# Patient Record
Sex: Female | Born: 1990 | Race: Black or African American | Hispanic: No | Marital: Single | Smoking: Current every day smoker
Health system: Southern US, Community
[De-identification: ages and names within clinical notes are randomized; demographics above are authoritative.]

## PROBLEM LIST (undated history)

## (undated) DIAGNOSIS — I10 Essential (primary) hypertension: Secondary | ICD-10-CM

## (undated) DIAGNOSIS — K8020 Calculus of gallbladder without cholecystitis without obstruction: Secondary | ICD-10-CM

## (undated) DIAGNOSIS — F419 Anxiety disorder, unspecified: Secondary | ICD-10-CM

## (undated) DIAGNOSIS — E669 Obesity, unspecified: Secondary | ICD-10-CM

## (undated) DIAGNOSIS — F32A Depression, unspecified: Secondary | ICD-10-CM

## (undated) HISTORY — PX: NO PAST SURGERIES: SHX2092

---

## 2012-01-07 ENCOUNTER — Emergency Department (HOSPITAL_COMMUNITY)
Admission: EM | Admit: 2012-01-07 | Discharge: 2012-01-07 | Disposition: A | Discharge status: Home / Self Care | Payer: Medicaid Other | Attending: Emergency Medicine | Admitting: Emergency Medicine

## 2012-01-07 ENCOUNTER — Encounter (HOSPITAL_COMMUNITY): Payer: Self-pay | Admitting: Emergency Medicine

## 2012-01-07 DIAGNOSIS — R220 Localized swelling, mass and lump, head: Secondary | ICD-10-CM | POA: Insufficient documentation

## 2012-01-07 DIAGNOSIS — K089 Disorder of teeth and supporting structures, unspecified: Secondary | ICD-10-CM | POA: Insufficient documentation

## 2012-01-07 DIAGNOSIS — K0889 Other specified disorders of teeth and supporting structures: Secondary | ICD-10-CM

## 2012-01-07 MED ORDER — HYDROCODONE-ACETAMINOPHEN 5-500 MG PO TABS: 1.0000 | ORAL_TABLET | Freq: Four times a day (QID) | ORAL | Status: AC | PRN

## 2012-01-07 MED ORDER — IBUPROFEN 800 MG PO TABS: 800.0000 mg | ORAL_TABLET | Freq: Three times a day (TID) | ORAL | Status: AC

## 2012-01-07 MED ORDER — OXYCODONE-ACETAMINOPHEN 5-325 MG PO TABS
2.0000 | ORAL_TABLET | Freq: Once | ORAL | Status: AC
  Administered 2012-01-07: 2 via ORAL
  Filled 2012-01-07: qty 2

## 2012-01-07 MED ORDER — PENICILLIN V POTASSIUM 500 MG PO TABS: 500.0000 mg | ORAL_TABLET | Freq: Three times a day (TID) | ORAL | Status: AC

## 2012-01-07 NOTE — ED Notes (Addendum)
Alert, NAD, calm, interactive, c/o 10/10 pain, family at [facility].

## 2012-01-07 NOTE — ED Provider Notes (Signed)
History     CSN: [id number]  Arrival date & time 01/07/12  2144   First MD Initiated Contact with Patient 01/07/12 2239      Chief Complaint  Patient presents with  . Dental Pain    (Consider location/radiation/quality/duration/timing/severity/associated sxs/prior treatment) HPI  Patient presents to emergency department complaining of a two-week history of gradual onset left lower dental pain that has become more constant in nature over the last 2-3 days. Patient states that she recently moved to [place] from outside city. Patient states she has scheduled an appointment with a dentist in [place] on Monday, 5 days from now. Patient states that she's been taking an occasional Tylenol and aspirin for pain with only mild relief of pain. Patient states some mild swelling of her left lower jaw. She states she has no known medical problems and takes no medicines on a regular basis. Patient states pain is aggravated by chewing and cold air. She denies fevers, chills, difficulty swallowing, or difficulty breathing.  History reviewed. No pertinent past medical history.  History reviewed. No pertinent past surgical history.  No family history on file.  History  Substance Use Topics  . Smoking status: Current Everyday Smoker    Types: Cigarettes  . Smokeless tobacco: Not on file  . Alcohol Use: No    OB History    Grav Para Term Preterm Abortions TAB SAB Ect Mult Living                  Review of Systems  Constitutional: Negative for fever and chills.  HENT: Positive for facial swelling and dental problem. Negative for ear pain, sore throat, drooling, mouth sores, trouble swallowing and neck pain.   Eyes: Negative for visual disturbance.  Respiratory: Negative for shortness of breath.   Cardiovascular: Negative for chest pain.  Skin: Negative for color change.    Allergies  Review of patient's allergies indicates no known allergies.  Home Medications   Current  Outpatient Rx  Name Route Sig Dispense Refill  . HYDROCODONE-ACETAMINOPHEN 5-500 MG PO TABS Oral Take 1-2 tablets by mouth every 6 (six) hours as needed for pain. 15 tablet 0  . IBUPROFEN 800 MG PO TABS Oral Take 1 tablet (800 mg total) by mouth 3 (three) times daily. 21 tablet 0  . PENICILLIN V POTASSIUM 500 MG PO TABS Oral Take 1 tablet (500 mg total) by mouth 3 (three) times daily. 30 tablet 0    BP 143/99  Pulse 74  Temp(Src) 98.9 F (37.2 C) (Oral)  Resp 18  SpO2 99%  LMP 11/26/2011  Physical Exam  Nursing note and vitals reviewed. Constitutional: She is oriented to person, place, and time. She appears well-developed and well-nourished.  HENT:  Head: Normocephalic and atraumatic.  Right Ear: External ear normal.  Left Ear: External ear normal.  Mouth/Throat: Oropharynx is clear and moist. No oropharyngeal exudate.        Moderate tenderness to palpation of left lower first and second molars without obvious dental decay. No gingival swelling or mass. Mild tenderness to palpation of surrounding gingiva. No facial swelling. Uvula midline and patent airway. Swallowing secretions well. Normal TMs bilaterally.   Eyes: Conjunctivae are normal.  Neck: Normal range of motion. Neck supple.  Cardiovascular: Normal rate and regular rhythm.   Pulmonary/Chest: Effort normal and breath sounds normal.  Musculoskeletal: Normal range of motion.  Lymphadenopathy:    She has no cervical adenopathy.  Neurological: She is alert and oriented to person, place, and time.  Skin: Skin is warm and dry.  Psychiatric: She has a normal mood and affect. Her behavior is normal.    ED Course  Procedures (including critical care time)  Labs Reviewed - No data to display No results found.   1. Pain, dental       MDM  Patient is afebrile and nontoxic-appearing. No facial swelling. No obvious sign of dental infection however tenderness to palpation of left lower molars. Patient has a scheduled  appointment with dentist in 5 days. She is agreeable to following up with dentist but returning to emergency department for emergent changing or worsening symptoms.        Jenness Corner, [place] 01/07/12 2325

## 2012-01-07 NOTE — Discharge Instructions (Signed)
Apply warm compresses to jaw throughout the day. Take antibiotic and completion. Take ibuprofen as directed for pain and yake hydrocodone-acetaminophen as directed, as needed for pain but do not drive or operate machinery with pain medication use. Followup with a dentist is very important for ongoing evaluation and management of recurrent dental pain. Have her return to emergency department for emergent changing or worsening symptoms.   Toothache Toothaches are usually caused by tooth decay (cavity). However, other causes of toothache include:  Gum disease.   Cracked tooth.   Cracked filling.   Injury.   Jaw problem (temporo mandibular joint or TMJ disorder).   Tooth abscess.   Root sensitivity.   Grinding.   Eruption problems.  Swelling and redness around a painful tooth often means you have a dental abscess. Pain medicine and antibiotics can help reduce symptoms, but you will need to see a dentist within the next few days to have your problem properly evaluated and treated. If tooth decay is the problem, you may need a filling or root canal to save your tooth. If the problem is more severe, your tooth may need to be pulled. SEEK IMMEDIATE MEDICAL CARE IF:  You cannot swallow.   You develop severe swelling, increased redness, or increased pain in your mouth or face.   You have a fever.   You cannot open your mouth adequately.  Document Released: 10/09/2004 Document Revised: 08/21/2011 Document Reviewed: 11/29/2009 [facility] Patient Information 2012 [place], [place].

## 2012-01-07 NOTE — ED Notes (Signed)
Patient with dental pain in upper right jaw.  Some swelling noted.  Patient with appt with dentist on Monday.  Just moved here.

## 2012-01-07 NOTE — ED Notes (Signed)
Steady gait, given rx x3, out to d/c desk, out with family.

## 2012-01-11 NOTE — ED Provider Notes (Signed)
Medical screening examination/treatment/procedure(s) were performed by non-physician practitioner and as supervising physician I was immediately available for consultation/collaboration.  Cypress Hinkson, MD 01/11/12 1719 

## 2012-04-25 ENCOUNTER — Emergency Department (HOSPITAL_COMMUNITY)
Admission: EM | Admit: 2012-04-25 | Discharge: 2012-04-25 | Discharge status: Against Medical Advice | Payer: Medicaid Other | Attending: Emergency Medicine | Admitting: Emergency Medicine

## 2012-04-25 ENCOUNTER — Encounter (HOSPITAL_COMMUNITY): Payer: Self-pay | Admitting: *Deleted

## 2012-04-25 DIAGNOSIS — N898 Other specified noninflammatory disorders of vagina: Secondary | ICD-10-CM | POA: Insufficient documentation

## 2012-04-25 LAB — URINALYSIS, ROUTINE W REFLEX MICROSCOPIC
Bilirubin Urine: NEGATIVE
Ketones, ur: NEGATIVE mg/dL
Nitrite: NEGATIVE
Urobilinogen, UA: 1 mg/dL (ref 0.0–1.0)

## 2012-04-25 NOTE — ED Notes (Signed)
Pt was recently and here with yeast infection and never go meds filled for it d/t money and now back with same vaginal symptoms.  No abdominal pain.

## 2012-06-05 ENCOUNTER — Encounter (HOSPITAL_COMMUNITY): Payer: Self-pay | Admitting: *Deleted

## 2012-06-05 ENCOUNTER — Emergency Department (HOSPITAL_COMMUNITY)
Admission: EM | Admit: 2012-06-05 | Discharge: 2012-06-05 | Disposition: A | Discharge status: Home / Self Care | Payer: Medicaid Other | Attending: Emergency Medicine | Admitting: Emergency Medicine

## 2012-06-05 DIAGNOSIS — K029 Dental caries, unspecified: Secondary | ICD-10-CM | POA: Insufficient documentation

## 2012-06-05 DIAGNOSIS — K0889 Other specified disorders of teeth and supporting structures: Secondary | ICD-10-CM

## 2012-06-05 DIAGNOSIS — F17200 Nicotine dependence, unspecified, uncomplicated: Secondary | ICD-10-CM | POA: Insufficient documentation

## 2012-06-05 MED ORDER — TRAMADOL HCL 50 MG PO TABS
50.0000 mg | ORAL_TABLET | Freq: Once | ORAL | Status: AC
  Administered 2012-06-05: 50 mg via ORAL
  Filled 2012-06-05: qty 1

## 2012-06-05 MED ORDER — TRAMADOL HCL 50 MG PO TABS: 50.0000 mg | ORAL_TABLET | Freq: Four times a day (QID) | ORAL | Status: DC | PRN

## 2012-06-05 NOTE — ED Provider Notes (Signed)
History     CSN: [id number]  Arrival date & time 06/05/12  2052   First MD Initiated Contact with Patient 06/05/12 2117      Chief Complaint  Patient presents with  . Dental Pain    (Consider location/radiation/quality/duration/timing/severity/associated sxs/prior treatment) HPI Comments: Patient with a history of large cavity in the upper second molar on the right, states, about 2 weeks, ago.  The posterior portion of the tooth.  For, cough.  She's had increased pain.  She does have a dental appointment in 4, days.  The pain has increased despite her use of over-the-counter Tylenol  Patient is a 21 y.o. female presenting with tooth pain. The history is provided by the patient.  Dental PainThe primary symptoms include dental injury. Primary symptoms do not include headaches or fever. The symptoms began more than 1 week ago. The symptoms are worsening.  Additional symptoms do not include: ear pain.    History reviewed. No pertinent past medical history.  History reviewed. No pertinent past surgical history.  No family history on file.  History  Substance Use Topics  . Smoking status: Current Every Day Smoker    Types: Cigarettes  . Smokeless tobacco: Not on file  . Alcohol Use: No    OB History    Grav Para Term Preterm Abortions TAB SAB Ect Mult Living                  Review of Systems  Constitutional: Negative for fever.  HENT: Positive for dental problem. Negative for ear pain and rhinorrhea.   Gastrointestinal: Negative for nausea.  Musculoskeletal: Negative for myalgias.  Neurological: Negative for dizziness and headaches.    Allergies  Review of patient's allergies indicates no known allergies.  Home Medications   Current Outpatient Rx  Name Route Sig Dispense Refill  . TRAMADOL HCL 50 MG PO TABS Oral Take 1 tablet (50 mg total) by mouth every 6 (six) hours as needed for pain. 30 tablet 0    BP 140/79  Temp 98.7 F (37.1 C) (Oral)  Resp 18  SpO2  99%  Physical Exam  Constitutional: She is oriented to person, place, and time. She appears well-developed and well-nourished.  HENT:  Head: Normocephalic.  Mouth/Throat:    Eyes: Pupils are equal, round, and reactive to light.  Neck: Normal range of motion.  Cardiovascular: Normal rate.   Pulmonary/Chest: Effort normal.  Musculoskeletal: Normal range of motion.  Neurological: She is alert and oriented to person, place, and time.  Skin: Skin is warm.    ED Course  Procedures (including critical care time)  Labs Reviewed - No data to display No results found.   1. Pain, dental       MDM  Dental cavity with dental fracture.  As, well.  We'll treat with Ultram.  Encourage patient to keep her appointment on Thursday as scheduled        Arman Filter, NP 06/05/12 2146  Arman Filter, NP 06/05/12 2146

## 2012-06-05 NOTE — ED Notes (Signed)
The patient is AOx4 and comfortable with her discharge instructions. 

## 2012-06-05 NOTE — ED Notes (Signed)
Toothache for one week 

## 2012-06-06 NOTE — ED Provider Notes (Signed)
Medical screening examination/treatment/procedure(s) were performed by non-physician practitioner and as supervising physician I was immediately available for consultation/collaboration.  Donnetta Hutching, MD 06/06/12 1752

## 2013-02-09 ENCOUNTER — Emergency Department (HOSPITAL_COMMUNITY)
Admission: EM | Admit: 2013-02-09 | Discharge: 2013-02-09 | Disposition: A | Discharge status: Home / Self Care | Payer: Medicaid Other | Attending: Emergency Medicine | Admitting: Emergency Medicine

## 2013-02-09 ENCOUNTER — Encounter (HOSPITAL_COMMUNITY): Payer: Self-pay | Admitting: Adult Health

## 2013-02-09 DIAGNOSIS — F17200 Nicotine dependence, unspecified, uncomplicated: Secondary | ICD-10-CM | POA: Insufficient documentation

## 2013-02-09 DIAGNOSIS — L600 Ingrowing nail: Secondary | ICD-10-CM | POA: Insufficient documentation

## 2013-02-09 MED ORDER — CEPHALEXIN 500 MG PO CAPS: 500.0000 mg | ORAL_CAPSULE | Freq: Four times a day (QID) | ORAL | Status: DC

## 2013-02-09 MED ORDER — HYDROCODONE-ACETAMINOPHEN 10-325 MG PO TABS: 1.0000 | ORAL_TABLET | Freq: Four times a day (QID) | ORAL | Status: DC | PRN

## 2013-02-09 MED ORDER — HYDROCODONE-ACETAMINOPHEN 5-325 MG PO TABS
1.0000 | ORAL_TABLET | Freq: Once | ORAL | Status: AC
  Administered 2013-02-09: 1 via ORAL
  Filled 2013-02-09: qty 1

## 2013-02-09 NOTE — ED Provider Notes (Signed)
History     CSN: [id number]  Arrival date & time 02/09/13  0020   First MD Initiated Contact with Patient 02/09/13 [phone]      Chief Complaint  Patient presents with  . Toe Pain   HPI  History provided by the patient. The patient is a 22 year old female who presents with complaints of worsening pain to her left great toe and concerns for ingrown toenail and infection. Symptoms have been worsening over the past several days. Today however patient also reports having some purulent drainage and worsening pain and swelling around the toe. She denies having similar symptoms previously. She did attempt to use warm soaks but this did not help. She's not using treatments for pain or symptoms. Denies any other aggravating or alleviating factors. Denies any associated fever, chills or sweats. No weakness or numbness in the toe or foot. No other associated symptoms.    History reviewed. No pertinent past medical history.  History reviewed. No pertinent past surgical history.  History reviewed. No pertinent family history.  History  Substance Use Topics  . Smoking status: Current Every Day Smoker    Types: Cigarettes  . Smokeless tobacco: Not on file  . Alcohol Use: No    OB History   Grav Para Term Preterm Abortions TAB SAB Ect Mult Living                  Review of Systems  Constitutional: Negative for fever, chills and diaphoresis.  Neurological: Negative for weakness and numbness.  All other systems reviewed and are negative.    Allergies  Review of patient's allergies indicates no known allergies.  Home Medications  No current outpatient prescriptions on file.  BP 159/96  Pulse 80  Temp(Src) 98.5 F (36.9 C) (Oral)  Resp 16  SpO2 98%  Physical Exam  Nursing note and vitals reviewed. Constitutional: She is oriented to person, place, and time. She appears well-developed and well-nourished. No distress.  HENT:  Head: Normocephalic.  Cardiovascular: Normal rate and  regular rhythm.   Pulmonary/Chest: Effort normal and breath sounds normal.  Musculoskeletal: Normal range of motion.  Ingrown toenail to the medial aspect of the left great toe with swelling of the surrounding skin and erythema. There is also small amounts of purulent drainage expressed with palpation.  Neurological: She is alert and oriented to person, place, and time.  Skin: Skin is warm and dry. No rash noted.  Psychiatric: She has a normal mood and affect. Her behavior is normal.    ED Course  NAIL REMOVAL Date/Time: 02/09/2013 2:30 AM Performed by: Angus Seller Authorized by: Angus Seller Consent: Verbal consent obtained. Risks and benefits: risks, benefits and alternatives were discussed Consent given by: patient Patient understanding: patient states understanding of the procedure being performed Patient consent: the patient's understanding of the procedure matches consent given Patient identity confirmed: verbally with patient Time out: Immediately prior to procedure a "time out" was called to verify the correct patient, procedure, equipment, support staff and site/side marked as required. Location: left foot Location details: left big toe Anesthesia: digital block Local anesthetic: lidocaine 2% without epinephrine Anesthetic total: 8 ml Preparation: skin prepped with Betadine and skin prepped with ChloraPrep Amount removed: 1/5 Nail bed sutured: no Nail matrix removed: none Removed nail replaced and anchored: no Comments: Patient was uncomfortable throughout the procedure. Only a portion of the distal wedge of the nail was removed.  No immediate complications.  1. Ingrown toenail       MDM  Patient seen and evaluated. Patient appears well in no acute distress. Doesn't appear in mild discomfort.  Patient continued to have discomforts during the procedure. I was unable to remove the entire wedge section of the nail. At this time will provide patient  with prescriptions for antibiotics and podiatry followup.      Angus Seller, PA-C 02/09/13 [phone]

## 2013-02-09 NOTE — ED Provider Notes (Signed)
Medical screening examination/treatment/procedure(s) were performed by non-physician practitioner and as supervising physician I was immediately available for consultation/collaboration.  Larita Deremer L Heron Pitcock, MD 02/09/13 0730 

## 2013-02-09 NOTE — ED Notes (Signed)
Presents with "ingrown toenail" on left great toe. Pt staes she tried soaking toe and had pus drainage but the toe still hurts.

## 2013-04-18 ENCOUNTER — Encounter (HOSPITAL_COMMUNITY): Payer: Self-pay

## 2013-04-18 DIAGNOSIS — F17200 Nicotine dependence, unspecified, uncomplicated: Secondary | ICD-10-CM | POA: Insufficient documentation

## 2013-04-18 DIAGNOSIS — A598 Trichomoniasis of other sites: Secondary | ICD-10-CM | POA: Insufficient documentation

## 2013-04-18 DIAGNOSIS — L259 Unspecified contact dermatitis, unspecified cause: Secondary | ICD-10-CM | POA: Insufficient documentation

## 2013-04-18 DIAGNOSIS — R51 Headache: Secondary | ICD-10-CM | POA: Insufficient documentation

## 2013-04-18 DIAGNOSIS — Z3202 Encounter for pregnancy test, result negative: Secondary | ICD-10-CM | POA: Insufficient documentation

## 2013-04-18 NOTE — ED Notes (Signed)
Pt c/o rash to Bilateral arm and hands x1-2 months, pt reports hx of eczema. Pt feels this is a different location than her eczema. Pt reports irritation due to the type of gloves she wears at work. Pt feels this is a result of chemicals at her place of employment. Pt also c/o headache staring today, pt took Tylenol w/no relief

## 2013-04-19 ENCOUNTER — Emergency Department (HOSPITAL_COMMUNITY)
Admission: EM | Admit: 2013-04-19 | Discharge: 2013-04-19 | Disposition: A | Discharge status: Home / Self Care | Payer: Medicaid Other | Attending: Emergency Medicine | Admitting: Emergency Medicine

## 2013-04-19 DIAGNOSIS — R519 Headache, unspecified: Secondary | ICD-10-CM

## 2013-04-19 DIAGNOSIS — L309 Dermatitis, unspecified: Secondary | ICD-10-CM

## 2013-04-19 DIAGNOSIS — A599 Trichomoniasis, unspecified: Secondary | ICD-10-CM

## 2013-04-19 LAB — URINALYSIS, ROUTINE W REFLEX MICROSCOPIC
Glucose, UA: NEGATIVE mg/dL
Hgb urine dipstick: NEGATIVE
Specific Gravity, Urine: 1.014 (ref 1.005–1.030)
pH: 6 (ref 5.0–8.0)

## 2013-04-19 LAB — URINE MICROSCOPIC-ADD ON

## 2013-04-19 LAB — POCT PREGNANCY, URINE: Preg Test, Ur: NEGATIVE

## 2013-04-19 MED ORDER — METRONIDAZOLE 500 MG PO TABS
500.0000 mg | ORAL_TABLET | Freq: Once | ORAL | Status: AC
  Administered 2013-04-19: 500 mg via ORAL
  Filled 2013-04-19: qty 1

## 2013-04-19 MED ORDER — KETOROLAC TROMETHAMINE 30 MG/ML IJ SOLN
30.0000 mg | Freq: Once | INTRAMUSCULAR | Status: DC
  Filled 2013-04-19: qty 1

## 2013-04-19 MED ORDER — TRIAMCINOLONE ACETONIDE 0.5 % EX CREA: TOPICAL_CREAM | Freq: Two times a day (BID) | CUTANEOUS | Status: AC

## 2013-04-19 MED ORDER — TRIAMCINOLONE ACETONIDE 0.5 % EX CREA
TOPICAL_CREAM | Freq: Once | CUTANEOUS | Status: AC
  Administered 2013-04-19: 03:00:00 via TOPICAL
  Filled 2013-04-19: qty 15

## 2013-04-19 MED ORDER — IBUPROFEN 800 MG PO TABS
800.0000 mg | ORAL_TABLET | Freq: Once | ORAL | Status: AC
  Administered 2013-04-19: 800 mg via ORAL
  Filled 2013-04-19: qty 1

## 2013-04-19 MED ORDER — METRONIDAZOLE 500 MG PO TABS: 500.0000 mg | ORAL_TABLET | Freq: Two times a day (BID) | ORAL | Status: AC

## 2013-04-19 NOTE — ED Provider Notes (Signed)
CSN: [id number]     Arrival date & time 04/18/13  2337 History     First MD Initiated Contact with Patient 04/19/13 0249     Chief Complaint  Patient presents with  . Headache  . Rash   (Consider location/radiation/quality/duration/timing/severity/associated sxs/prior Treatment) HPI Comments: Vision presents tonight with right sided temporal pain.  That started as a dull ache yesterday, but has persistently worse.  Today.  He, states, that she thinks this might be because she recently had her hair braided in its very tight and has not loosened up on the outside.  She did take 1 dose of Tylenol without any relief.  She denies any nausea, photophobia.  She also presents tonight complaining of 1-2 months worth of increased rash.  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 female presenting with headaches and rash. The history is provided by the patient.  Headache Pain location:  R temporal Quality:  Dull Radiates to:  Does not radiate Severity currently:  6/10 Duration:  3 hours Timing:  Constant Progression:  Unchanged Chronicity:  Recurrent Relieved by:  Nothing Ineffective treatments:  Acetaminophen Associated symptoms: no dizziness, no pain, no fever, no nausea, no neck pain and no photophobia   Rash Associated symptoms: no fever and no nausea     History reviewed. No pertinent past medical history. History reviewed. No pertinent past surgical history. History reviewed. No pertinent family history. History  Substance Use Topics  . Smoking status: Current Every Day Smoker    Types:  Cigarettes  . Smokeless tobacco: Not on file  . Alcohol Use: No   OB History   Grav Para Term Preterm Abortions TAB SAB Ect Mult Living                 Review of Systems  Constitutional: Negative for fever.  HENT: Negative for facial swelling and neck pain.   Eyes: Negative for photophobia, pain and visual disturbance.  Gastrointestinal: Negative for nausea.  Skin: Positive for rash.  Neurological: Positive for headaches. Negative for dizziness.  All other systems reviewed and are negative.    Allergies  Review of patient's allergies indicates no known allergies.  Home Medications   Current Outpatient Rx  Name  Route  Sig  Dispense  Refill  . acetaminophen (TYLENOL) 650 MG CR tablet   Oral   Take 650 mg by mouth every 8 (eight) hours as needed for pain.         . metroNIDAZOLE (FLAGYL) 500 MG tablet   Oral   Take 1 tablet (500 mg total) by mouth 2 (two) times daily with a meal.   13 tablet   0   . triamcinolone cream (KENALOG) 0.5 %   Topical   Apply topically 2 (two) times daily.   120 g   0    BP 140/84  Pulse 89  Temp(Src) 98.8 F (37.1 C) (Oral)  Resp 14  SpO2 100%  LMP 04/14/2013 Physical Exam  Nursing note and vitals reviewed. Constitutional: She is oriented to  person, place, and time. She appears well-developed and well-nourished.  HENT:  Head: Normocephalic.  Right Ear: External ear normal.  Left Ear: External ear normal.  Eyes: Pupils are equal, round, and reactive to light.  Neck: Normal range of motion.  Cardiovascular: Normal rate and regular rhythm.   Pulmonary/Chest: Effort normal.  Musculoskeletal: Normal range of motion.  Lymphadenopathy:    She has no cervical adenopathy.  Neurological: She is alert and oriented to person, place, and time.  Skin: Rash noted.  Both antecubital areas show a thickened, dark rash with eczema.  She has a few scattered, raised, flesh-colored papules on the dorsal aspects of both hands    ED Course    Procedures (including critical care time)  Labs Reviewed  URINALYSIS, ROUTINE W REFLEX MICROSCOPIC - Abnormal; Notable for the following:    APPearance HAZY (*)    Leukocytes, UA MODERATE (*)    All other components within normal limits  URINE MICROSCOPIC-ADD ON - Abnormal; Notable for the following:    Squamous Epithelial / LPF FEW (*)    Bacteria, UA FEW (*)    All other components within normal limits  URINE CULTURE  POCT PREGNANCY, URINE   No results found. 1. Headache   2. Eczema   3. Trichomonas infection     MDM  After being treated with ibuprofen.  She has no longer has a headache.  Triamcinolone cream to use on her antecubital fossa is in a prescription for same.  Also of note, in her urine tests she had Trichomonas, and she is being treated with by mouth Flagyl.  I recommend followup with the health department or women's health clinic for test of cure in about 2 weeks   Arman Filter, NP 04/19/13 [phone]

## 2013-04-19 NOTE — ED Provider Notes (Signed)
Medical screening examination/treatment/procedure(s) were performed by non-physician practitioner and as supervising physician I was immediately available for consultation/collaboration.  Temesha Queener M Talita Recht, MD 04/19/13 0553 

## 2013-04-20 LAB — URINE CULTURE: Colony Count: NO GROWTH

## 2013-05-05 ENCOUNTER — Emergency Department (HOSPITAL_COMMUNITY): Admission: EM | Admit: 2013-05-05 | Discharge: 2013-05-05 | Discharge status: Absent without leave | Payer: Self-pay

## 2013-09-18 DIAGNOSIS — Z3202 Encounter for pregnancy test, result negative: Secondary | ICD-10-CM | POA: Insufficient documentation

## 2013-09-18 DIAGNOSIS — R0602 Shortness of breath: Secondary | ICD-10-CM | POA: Insufficient documentation

## 2013-09-18 DIAGNOSIS — F17200 Nicotine dependence, unspecified, uncomplicated: Secondary | ICD-10-CM | POA: Insufficient documentation

## 2013-09-18 DIAGNOSIS — M940 Chondrocostal junction syndrome [Tietze]: Secondary | ICD-10-CM | POA: Insufficient documentation

## 2013-09-19 ENCOUNTER — Emergency Department (HOSPITAL_COMMUNITY)
Admission: EM | Admit: 2013-09-19 | Discharge: 2013-09-19 | Disposition: A | Discharge status: Home / Self Care | Payer: Medicaid Other | Attending: Emergency Medicine | Admitting: Emergency Medicine

## 2013-09-19 ENCOUNTER — Emergency Department (HOSPITAL_COMMUNITY): Payer: Medicaid Other

## 2013-09-19 ENCOUNTER — Encounter (HOSPITAL_COMMUNITY): Payer: Self-pay | Admitting: Emergency Medicine

## 2013-09-19 DIAGNOSIS — M940 Chondrocostal junction syndrome [Tietze]: Secondary | ICD-10-CM

## 2013-09-19 LAB — BASIC METABOLIC PANEL
BUN: 10 mg/dL (ref 6–23)
CALCIUM: 9.4 mg/dL (ref 8.4–10.5)
CO2: 26 meq/L (ref 19–32)
CREATININE: 0.69 mg/dL (ref 0.50–1.10)
Chloride: 103 mEq/L (ref 96–112)
GFR calc Af Amer: >90 mL/min (ref >90)
GFR calc non Af Amer: >90 mL/min (ref >90)
Glucose, Bld: 98 mg/dL (ref 70–99)
Potassium: 3.9 mEq/L (ref 3.7–5.3)
Sodium: 143 mEq/L (ref 137–147)

## 2013-09-19 LAB — PRO B NATRIURETIC PEPTIDE: PRO B NATRI PEPTIDE: 16.5 pg/mL (ref 0–125)

## 2013-09-19 LAB — CBC
HEMATOCRIT: 39 % (ref 36.0–46.0)
Hemoglobin: 13.7 g/dL (ref 12.0–15.0)
MCH: 29.2 pg (ref 26.0–34.0)
MCHC: 35.1 g/dL (ref 30.0–36.0)
MCV: 83.2 fL (ref 78.0–100.0)
PLATELETS: 232 10*3/uL (ref 150–400)
RBC: 4.69 MIL/uL (ref 3.87–5.11)
RDW: 12.3 % (ref 11.5–15.5)
WBC: 7.4 10*3/uL (ref 4.0–10.5)

## 2013-09-19 LAB — POCT PREGNANCY, URINE: PREG TEST UR: NEGATIVE

## 2013-09-19 LAB — POCT I-STAT TROPONIN I: Troponin i, poc: 0 ng/mL (ref 0.00–0.08)

## 2013-09-19 MED ORDER — TRAMADOL HCL 50 MG PO TABS
50.0000 mg | ORAL_TABLET | Freq: Once | ORAL | Status: AC
  Administered 2013-09-19: 50 mg via ORAL
  Filled 2013-09-19: qty 1

## 2013-09-19 MED ORDER — TRAMADOL HCL 50 MG PO TABS: 50.0000 mg | ORAL_TABLET | Freq: Four times a day (QID) | ORAL | Status: DC | PRN

## 2013-09-19 NOTE — ED Notes (Signed)
Pt reports bilateral "rib pain" that radiates to breast with sob and nausea since Friday.  Denies cough.

## 2013-09-19 NOTE — ED Provider Notes (Signed)
CSN: [id number]     Arrival date & time 09/18/13  2355 History   First MD Initiated Contact with Patient 09/19/13 [phone]     Chief Complaint  Patient presents with  . Chest Pain   (Consider location/radiation/quality/duration/timing/severity/associated sxs/prior Treatment) The history is provided by the patient.  Stephanie Gutierrez is a 23 y.o. female here with bilateral rib pain. Bilateral rib pain for the last 2 days. She started a new job and is on her feet a lot and wears a tight bra. Pain was intermittent and worse with movement. Also occasional SOB. Denies history of CAD or stents. She is a smoker and has no PE risk factors.    History reviewed. No pertinent past medical history. History reviewed. No pertinent past surgical history. No family history on file. History  Substance Use Topics  . Smoking status: Current Every Day Smoker    Types: Cigarettes  . Smokeless tobacco: Not on file  . Alcohol Use: No   OB History   Grav Para Term Preterm Abortions TAB SAB Ect Mult Living                 Review of Systems  Cardiovascular: Positive for chest pain.  All other systems reviewed and are negative.    Allergies  Review of patient's allergies indicates no known allergies.  Home Medications   Current Outpatient Rx  Name  Route  Sig  Dispense  Refill  . ibuprofen (ADVIL,MOTRIN) 200 MG tablet   Oral   Take 400 mg by mouth every 6 (six) hours as needed for fever.         . traMADol (ULTRAM) 50 MG tablet   Oral   Take 1 tablet (50 mg total) by mouth every 6 (six) hours as needed.   15 tablet   0    BP 141/88  Pulse 79  Temp(Src) 98.5 F (36.9 C) (Oral)  Resp 18  SpO2 98%  LMP 04/22/2013 Physical Exam  Nursing note and vitals reviewed. Constitutional: She is oriented to person, place, and time. She appears well-nourished.  Slightly uncomfortable   HENT:  Head: Normocephalic.  Mouth/Throat: Oropharynx is clear and moist.  Eyes: Conjunctivae are normal. Pupils are  equal, round, and reactive to light.  Neck: Normal range of motion. Neck supple.  Cardiovascular: Normal rate, regular rhythm and normal heart sounds.   Pulmonary/Chest: Effort normal and breath sounds normal. No respiratory distress. She has no wheezes. She has no rales.  Reproducible tenderness on ribs right under the bra. No tenderness on breast.   Abdominal: Soft. Bowel sounds are normal. She exhibits no distension. There is no tenderness. There is no rebound and no guarding.  Musculoskeletal: Normal range of motion.  Neurological: She is alert and oriented to person, place, and time.  Skin: Skin is warm and dry.  Psychiatric: She has a normal mood and affect. Her behavior is normal. Judgment and thought content normal.    ED Course  Procedures (including critical care time) Labs Review Labs Reviewed  CBC  BASIC METABOLIC PANEL  PRO B NATRIURETIC PEPTIDE  POCT PREGNANCY, URINE  POCT I-STAT TROPONIN I   Imaging Review Dg Chest 2 View  09/19/2013   CLINICAL DATA:  Chest pain  EXAM: CHEST  2 VIEW  COMPARISON:  None.  FINDINGS: The heart size and mediastinal contours are within normal limits. Both lungs are clear. The visualized skeletal structures are unremarkable.  IMPRESSION: No active cardiopulmonary disease.   Electronically Signed   By:  Tiburcio PeaJonathan  Watts M.D.   On: 09/19/2013 01:43    EKG Interpretation    Date/Time:  Monday September 19 2013 00:21:47 EST Ventricular Rate:  83 PR Interval:  156 QRS Duration: 84 QT Interval:  388 QTC Calculation: 455 R Axis:   67 Text Interpretation:  Normal sinus rhythm with sinus arrhythmia Normal ECG No previous ECGs available Confirmed by YAO  MD, DAVID [phone] on 09/19/2013 4:31:11 AM            MDM   1. Costochondritis   Stephanie Gutierrez is a 23 y.o. female here with CP, SOB. Pain reproducible, likely costochondritis. CXR nl. Labs nl. Trop neg x 1 and low risk for ACS. I doubt PE. BNP sent in triage and was normal and I don't think she  has CHF. Felt better with tramadol. Will d/c home with tramadol, motrin.    Richardean Canalavid H Yao, MD 09/19/13 [phone]

## 2013-09-19 NOTE — ED Notes (Signed)
MD at bedside. 

## 2013-09-19 NOTE — Discharge Instructions (Signed)
Take motrin for pain.   Take tramadol for severe pain. Do NOT drive with it.   Follow up with your doctor.   Try to get some rest. Wear more comfortable bra.   Return to ER if you have severe pain, vomiting.

## 2013-10-14 ENCOUNTER — Encounter (HOSPITAL_COMMUNITY): Payer: Self-pay | Admitting: Emergency Medicine

## 2013-10-14 DIAGNOSIS — Z3202 Encounter for pregnancy test, result negative: Secondary | ICD-10-CM | POA: Insufficient documentation

## 2013-10-14 DIAGNOSIS — R1013 Epigastric pain: Secondary | ICD-10-CM | POA: Insufficient documentation

## 2013-10-14 DIAGNOSIS — F17200 Nicotine dependence, unspecified, uncomplicated: Secondary | ICD-10-CM | POA: Insufficient documentation

## 2013-10-14 NOTE — ED Notes (Signed)
The pt is c/o epigastric pain for 2 months since she started working where she works.  She thinks she may have anxiety but has not been diagnosed with it.  No nv or diarrhea.  lmp  One year.  She has irregular periods

## 2013-10-14 NOTE — ED Notes (Signed)
The pt refused her blood work

## 2013-10-15 ENCOUNTER — Emergency Department (HOSPITAL_COMMUNITY): Payer: Medicaid Other

## 2013-10-15 ENCOUNTER — Emergency Department (HOSPITAL_COMMUNITY)
Admission: EM | Admit: 2013-10-15 | Discharge: 2013-10-15 | Disposition: A | Discharge status: Home / Self Care | Payer: Medicaid Other | Attending: Emergency Medicine | Admitting: Emergency Medicine

## 2013-10-15 DIAGNOSIS — R1013 Epigastric pain: Secondary | ICD-10-CM

## 2013-10-15 LAB — COMPREHENSIVE METABOLIC PANEL
ALBUMIN: 3.7 g/dL (ref 3.5–5.2)
ALK PHOS: 68 U/L (ref 39–117)
ALT: 15 U/L (ref 0–35)
AST: 12 U/L (ref 0–37)
BILIRUBIN TOTAL: 0.2 mg/dL — AB (ref 0.3–1.2)
BUN: 10 mg/dL (ref 6–23)
CHLORIDE: 102 meq/L (ref 96–112)
CO2: 27 meq/L (ref 19–32)
Calcium: 9.2 mg/dL (ref 8.4–10.5)
Creatinine, Ser: 0.75 mg/dL (ref 0.50–1.10)
GFR calc Af Amer: >90 mL/min (ref >90)
Glucose, Bld: 89 mg/dL (ref 70–99)
POTASSIUM: 3.7 meq/L (ref 3.7–5.3)
Sodium: 141 mEq/L (ref 137–147)
Total Protein: 7.6 g/dL (ref 6.0–8.3)

## 2013-10-15 LAB — LIPASE, BLOOD: Lipase: 22 U/L (ref 11–59)

## 2013-10-15 LAB — CBC WITH DIFFERENTIAL/PLATELET
BASOS PCT: 0 % (ref 0–1)
Basophils Absolute: 0 10*3/uL (ref 0.0–0.1)
Eosinophils Absolute: 0.1 10*3/uL (ref 0.0–0.7)
Eosinophils Relative: 2 % (ref 0–5)
HEMATOCRIT: 39.4 % (ref 36.0–46.0)
HEMOGLOBIN: 13.5 g/dL (ref 12.0–15.0)
Lymphocytes Relative: 52 % — ABNORMAL HIGH (ref 12–46)
Lymphs Abs: 4.5 10*3/uL — ABNORMAL HIGH (ref 0.7–4.0)
MCH: 28.8 pg (ref 26.0–34.0)
MCHC: 34.3 g/dL (ref 30.0–36.0)
MCV: 84 fL (ref 78.0–100.0)
MONO ABS: 0.5 10*3/uL (ref 0.1–1.0)
Monocytes Relative: 5 % (ref 3–12)
NEUTROS ABS: 3.6 10*3/uL (ref 1.7–7.7)
NEUTROS PCT: 41 % — AB (ref 43–77)
Platelets: 231 10*3/uL (ref 150–400)
RBC: 4.69 MIL/uL (ref 3.87–5.11)
RDW: 12.7 % (ref 11.5–15.5)
WBC: 8.6 10*3/uL (ref 4.0–10.5)

## 2013-10-15 LAB — URINALYSIS, ROUTINE W REFLEX MICROSCOPIC
Bilirubin Urine: NEGATIVE
GLUCOSE, UA: NEGATIVE mg/dL
Hgb urine dipstick: NEGATIVE
Ketones, ur: NEGATIVE mg/dL
Nitrite: NEGATIVE
PROTEIN: NEGATIVE mg/dL
Specific Gravity, Urine: 1.016 (ref 1.005–1.030)
Urobilinogen, UA: 0.2 mg/dL (ref 0.0–1.0)
pH: 6 (ref 5.0–8.0)

## 2013-10-15 LAB — URINE MICROSCOPIC-ADD ON

## 2013-10-15 LAB — PREGNANCY, URINE: PREG TEST UR: NEGATIVE

## 2013-10-15 MED ORDER — FAMOTIDINE 20 MG PO TABS
20.0000 mg | ORAL_TABLET | Freq: Once | ORAL | Status: AC
  Administered 2013-10-15: 20 mg via ORAL
  Filled 2013-10-15: qty 1

## 2013-10-15 MED ORDER — GI COCKTAIL ~~LOC~~
30.0000 mL | Freq: Once | ORAL | Status: AC
  Administered 2013-10-15: 30 mL via ORAL
  Filled 2013-10-15: qty 30

## 2013-10-15 NOTE — ED Notes (Signed)
Ultrasound at BS.

## 2013-10-15 NOTE — Discharge Instructions (Signed)
Abdominal Pain, Adult Take pepcid over the counter daily and make diet modifications as discussed.   Many things can cause abdominal pain. Usually, abdominal pain is not caused by a disease and will improve without treatment. It can often be observed and treated at home. Your health care provider will do a physical exam and possibly order blood tests and X-rays to help determine the seriousness of your pain. However, in many cases, more time must pass before a clear cause of the pain can be found. Before that point, your health care provider may not know if you need more testing or further treatment. HOME CARE INSTRUCTIONS  Monitor your abdominal pain for any changes. The following actions may help to alleviate any discomfort you are experiencing:  Only take over-the-counter or prescription medicines as directed by your health care provider.  Do not take laxatives unless directed to do so by your health care provider.  Try a clear liquid diet (broth, tea, or water) as directed by your health care provider. Slowly move to a bland diet as tolerated. SEEK MEDICAL CARE IF:  You have unexplained abdominal pain.  You have abdominal pain associated with nausea or diarrhea.  You have pain when you urinate or have a bowel movement.  You experience abdominal pain that wakes you in the night.  You have abdominal pain that is worsened or improved by eating food.  You have abdominal pain that is worsened with eating fatty foods. SEEK IMMEDIATE MEDICAL CARE IF:   Your pain does not go away within 2 hours.  You have a fever.  You keep throwing up (vomiting).  Your pain is felt only in portions of the abdomen, such as the right side or the left lower portion of the abdomen.  You pass bloody or black tarry stools. MAKE SURE YOU:  Understand these instructions.   Will watch your condition.   Will get help right away if you are not doing well or get worse.  Document Released: 06/11/2005  Document Revised: 06/22/2013 Document Reviewed: 05/11/2013 [facility] Patient Information 2014 [employer] [place].

## 2013-10-15 NOTE — ED Notes (Signed)
Nurse first rounds : Nurse explained delay / wait time and process to pt. , respirations unlabored . No distress.

## 2013-10-15 NOTE — ED Provider Notes (Signed)
CSN: [id number]     Arrival date & time 10/14/13  2332 History   First MD Initiated Contact with Patient 10/15/13 0235     Chief Complaint  Patient presents with  . Abdominal Pain   (Consider location/radiation/quality/duration/timing/severity/associated sxs/prior Treatment) HPI Hx per PT - epigastric ABD pain, dull and cramping, on and off last few weeks, persistent tonight, sometimes has associated R upper back pain not at this time. No F/C, no N/V/D. No blood in stools. Pain MOD in severity. LMP a year ago, has irreg periods.   History reviewed. No pertinent past medical history. History reviewed. No pertinent past surgical history. No family history on file. History  Substance Use Topics  . Smoking status: Current Every Day Smoker    Types: Cigarettes  . Smokeless tobacco: Not on file  . Alcohol Use: No   OB History   Grav Para Term Preterm Abortions TAB SAB Ect Mult Living                 Review of Systems  Constitutional: Negative for fever and chills.  Respiratory: Negative for shortness of breath.   Cardiovascular: Negative for chest pain.  Gastrointestinal: Positive for abdominal pain.  Genitourinary: Negative for dysuria and hematuria.  Musculoskeletal: Negative for back pain, neck pain and neck stiffness.  Skin: Negative for rash.  Neurological: Negative for headaches.  All other systems reviewed and are negative.    Allergies  Review of patient's allergies indicates no known allergies.  Home Medications   Current Outpatient Rx  Name  Route  Sig  Dispense  Refill  . ibuprofen (ADVIL,MOTRIN) 200 MG tablet   Oral   Take 400 mg by mouth every 6 (six) hours as needed for fever.          BP 129/79  Pulse 77  Temp(Src) 97.9 F (36.6 C) (Oral)  Resp 18  Ht 5\' 7"  (1.702 m)  Wt 224 lb 2 oz (101.662 kg)  BMI 35.09 kg/m2  SpO2 100%  LMP 04/22/2013 Physical Exam  Constitutional: She is oriented to person, place, and time. She appears well-developed and  well-nourished.  HENT:  Head: Normocephalic and atraumatic.  Eyes: EOM are normal. Pupils are equal, round, and reactive to light.  Neck: Neck supple.  Cardiovascular: Normal rate, regular rhythm and intact distal pulses.   Pulmonary/Chest: Effort normal and breath sounds normal. No respiratory distress.  Abdominal: Soft. Bowel sounds are normal. She exhibits no distension. There is no rebound and no guarding.  Tender epigastric and somewhat RUQ, neg Murphys sign  Musculoskeletal: Normal range of motion. She exhibits no edema.  Neurological: She is alert and oriented to person, place, and time.  Skin: Skin is warm and dry.    ED Course  Procedures (including critical care time) Labs Review Labs Reviewed  URINALYSIS, ROUTINE W REFLEX MICROSCOPIC - Abnormal; Notable for the following:    APPearance CLOUDY (*)    Leukocytes, UA TRACE (*)    All other components within normal limits  CBC WITH DIFFERENTIAL - Abnormal; Notable for the following:    Neutrophils Relative % 41 (*)    Lymphocytes Relative 52 (*)    Lymphs Abs 4.5 (*)    All other components within normal limits  COMPREHENSIVE METABOLIC PANEL - Abnormal; Notable for the following:    Total Bilirubin 0.2 (*)    All other components within normal limits  URINE MICROSCOPIC-ADD ON - Abnormal; Notable for the following:    Squamous Epithelial / LPF MANY (*)  All other components within normal limits  PREGNANCY, URINE  LIPASE, BLOOD  TROPONIN I   Imaging Review [place] Abdomen Complete  10/15/2013   CLINICAL DATA:  Epigastric and right upper quadrant pain.  EXAM: ULTRASOUND ABDOMEN COMPLETE  COMPARISON:  None.  FINDINGS: Gallbladder:  No gallstones or wall thickening visualized. No sonographic Murphy sign noted.  Common bile duct:  Diameter: 5 mm  Liver:  No focal lesion identified. Within normal limits in parenchymal echogenicity.  IVC:  No abnormality visualized.  Pancreas:  Visualized portion unremarkable.  Spleen:  Size and  appearance within normal limits.  Right Kidney:  Length: 12 cm. Echogenicity within normal limits. No mass or hydronephrosis visualized.  Left Kidney:  Length: 12 cm. Echogenicity within normal limits. No mass or hydronephrosis visualized.  Abdominal aorta:  No aneurysm visualized.  Other findings:  None.  IMPRESSION: Negative complete abdominal ultrasound.   Electronically Signed   By: Tiburcio PeaJonathan  Watts M.D.   On: 10/15/2013 05:16   GI cocktail provided. On recheck is pain free. Ultrasound reviewed as above and patient requesting to be discharged home.  Plan Pepcid over-the-counter and followup primary care physician. Return precautions provided and verbalizes understood. MDM  Diagnosis: Epigastric pain  evaluated with labs, urinalysis and imaging reviewed as above. Normal gallbladder. Improved with GI medications - symptoms likely gastritis Vital signs and nursing notes reviewed and considered   Sunnie NielsenBrian Sai Zinn, MD 10/15/13 [phone]

## 2013-11-15 ENCOUNTER — Encounter (HOSPITAL_COMMUNITY): Payer: Self-pay | Admitting: Emergency Medicine

## 2013-11-15 ENCOUNTER — Emergency Department (INDEPENDENT_AMBULATORY_CARE_PROVIDER_SITE_OTHER)
Admission: EM | Admit: 2013-11-15 | Discharge: 2013-11-15 | Disposition: A | Discharge status: Home / Self Care | Payer: Medicaid Other | Source: Home / Self Care | Attending: Family Medicine | Admitting: Family Medicine

## 2013-11-15 DIAGNOSIS — N926 Irregular menstruation, unspecified: Secondary | ICD-10-CM

## 2013-11-15 LAB — POCT URINALYSIS DIP (DEVICE)
Bilirubin Urine: NEGATIVE
GLUCOSE, UA: NEGATIVE mg/dL
HGB URINE DIPSTICK: NEGATIVE
KETONES UR: NEGATIVE mg/dL
Leukocytes, UA: NEGATIVE
Nitrite: NEGATIVE
Protein, ur: NEGATIVE mg/dL
SPECIFIC GRAVITY, URINE: 1.025 (ref 1.005–1.030)
Urobilinogen, UA: 0.2 mg/dL (ref 0.0–1.0)
pH: 5.5 (ref 5.0–8.0)

## 2013-11-15 LAB — POCT PREGNANCY, URINE: PREG TEST UR: NEGATIVE

## 2013-11-15 NOTE — ED Notes (Signed)
Patient reports trying to conceive, patient has irregular periods and has been trying to conceive for 1 1/2 years with no pregnancy.  No gyn.

## 2013-11-15 NOTE — ED Provider Notes (Signed)
CSN: [id number]     Arrival date & time 11/15/13  0815 History   First MD Initiated Contact with Patient 11/15/13 0825     Chief Complaint  Patient presents with  . Patient Education   (Consider location/radiation/quality/duration/timing/severity/associated sxs/prior Treatment) HPI Comments: Patient states that she and her partner have been trying to conceive a child for about 1.5 years and have been unsuccessful. [facility] regarding a fertility evaluation. Reports long standing issues with menstrual irregularities. States her LNMP was one week ago but prior to that it had been at least a year since her last menses. Denies any additional complaints. States her partner has children from a previous relationship.  The history is provided by the patient.    History reviewed. No pertinent past medical history. History reviewed. No pertinent past surgical history. No family history on file. History  Substance Use Topics  . Smoking status: Current Every Day Smoker    Types: Cigarettes  . Smokeless tobacco: Not on file  . Alcohol Use: No   OB History   Grav Para Term Preterm Abortions TAB SAB Ect Mult Living                 Review of Systems  All other systems reviewed and are negative.    Allergies  Review of patient's allergies indicates no known allergies.  Home Medications   Current Outpatient Rx  Name  Route  Sig  Dispense  Refill  . ibuprofen (ADVIL,MOTRIN) 200 MG tablet   Oral   Take 400 mg by mouth every 6 (six) hours as needed for fever.          BP 163/82  Pulse 74  Temp(Src) 98.1 F (36.7 C) (Oral)  Resp 16  SpO2 98% Physical Exam  Nursing note and vitals reviewed. Constitutional: She is oriented to person, place, and time. She appears well-developed and well-nourished. No distress.  HENT:  Head: Normocephalic and atraumatic.  Eyes: Conjunctivae are normal.  Cardiovascular: Normal rate.    Pulmonary/Chest: Effort normal.  Abdominal: Soft. There is no tenderness.  Musculoskeletal: Normal range of motion.  Neurological: She is alert and oriented to person, place, and time.  Skin: Skin is warm and dry.  Psychiatric: She has a normal mood and affect. Her behavior is normal.    ED Course  Procedures (including critical care time) Labs Review Labs Reviewed  POCT URINALYSIS DIP (DEVICE)  POCT PREGNANCY, URINE   Imaging Review No results found.   MDM   1. Irregular menses    Patient is not established with PCP or Ob/GYN provider. Recommended that she begin to establish care with both PCP and GYN providers as a GYN specialist would need to assist her with issues of infertility. Provided patient with area resources for providers.  UA: normal UPT: negative  Jess BartersJennifer Lee WautomaPresson, [place] 11/15/13 [phone]

## 2013-11-16 NOTE — ED Provider Notes (Signed)
Medical screening examination/treatment/procedure(s) were performed by resident physician or non-physician practitioner and as supervising physician I was immediately available for consultation/collaboration.   Jouri Threat DOUGLAS MD.   Yuki Purves D Shailen Thielen, MD 11/16/13 1139 

## 2014-07-09 ENCOUNTER — Inpatient Hospital Stay (HOSPITAL_COMMUNITY)
Admission: EM | Admit: 2014-07-09 | Discharge: 2014-07-10 | DRG: 558 | Disposition: A | Discharge status: Home / Self Care | Payer: Medicaid Other | Attending: Internal Medicine | Admitting: Internal Medicine

## 2014-07-09 ENCOUNTER — Encounter (HOSPITAL_COMMUNITY): Payer: Self-pay | Admitting: Emergency Medicine

## 2014-07-09 DIAGNOSIS — Z79899 Other long term (current) drug therapy: Secondary | ICD-10-CM

## 2014-07-09 DIAGNOSIS — M6282 Rhabdomyolysis: Principal | ICD-10-CM | POA: Diagnosis present

## 2014-07-09 DIAGNOSIS — M79602 Pain in left arm: Secondary | ICD-10-CM

## 2014-07-09 DIAGNOSIS — E669 Obesity, unspecified: Secondary | ICD-10-CM | POA: Diagnosis present

## 2014-07-09 DIAGNOSIS — M79601 Pain in right arm: Secondary | ICD-10-CM

## 2014-07-09 DIAGNOSIS — F17210 Nicotine dependence, cigarettes, uncomplicated: Secondary | ICD-10-CM | POA: Diagnosis present

## 2014-07-09 DIAGNOSIS — Z6839 Body mass index (BMI) 39.0-39.9, adult: Secondary | ICD-10-CM

## 2014-07-09 DIAGNOSIS — I10 Essential (primary) hypertension: Secondary | ICD-10-CM | POA: Diagnosis present

## 2014-07-09 HISTORY — DX: Essential (primary) hypertension: I10

## 2014-07-09 LAB — I-STAT CG4 LACTIC ACID, ED: Lactic Acid, Venous: 1.12 mmol/L (ref 0.5–2.2)

## 2014-07-09 LAB — BASIC METABOLIC PANEL
Anion gap: 13 (ref 5–15)
BUN: 7 mg/dL (ref 6–23)
CO2: 25 mEq/L (ref 19–32)
Calcium: 9.5 mg/dL (ref 8.4–10.5)
Chloride: 102 mEq/L (ref 96–112)
Creatinine, Ser: 0.9 mg/dL (ref 0.50–1.10)
GFR calc Af Amer: >90 mL/min (ref >90)
GFR calc non Af Amer: 89 mL/min — ABNORMAL LOW (ref >90)
Glucose, Bld: 90 mg/dL (ref 70–99)
Potassium: 3.7 mEq/L (ref 3.7–5.3)
Sodium: 140 mEq/L (ref 137–147)

## 2014-07-09 LAB — CBC WITH DIFFERENTIAL/PLATELET
Basophils Absolute: 0 10*3/uL (ref 0.0–0.1)
Basophils Relative: 0 % (ref 0–1)
Eosinophils Absolute: 0.1 10*3/uL (ref 0.0–0.7)
Eosinophils Relative: 1 % (ref 0–5)
HCT: 35.3 % — ABNORMAL LOW (ref 36.0–46.0)
Hemoglobin: 12.2 g/dL (ref 12.0–15.0)
Lymphocytes Relative: 49 % — ABNORMAL HIGH (ref 12–46)
Lymphs Abs: 3.6 10*3/uL (ref 0.7–4.0)
MCH: 27.7 pg (ref 26.0–34.0)
MCHC: 34.6 g/dL (ref 30.0–36.0)
MCV: 80 fL (ref 78.0–100.0)
Monocytes Absolute: 0.4 10*3/uL (ref 0.1–1.0)
Monocytes Relative: 5 % (ref 3–12)
Neutro Abs: 3.3 10*3/uL (ref 1.7–7.7)
Neutrophils Relative %: 45 % (ref 43–77)
Platelets: 302 10*3/uL (ref 150–400)
RBC: 4.41 MIL/uL (ref 3.87–5.11)
RDW: 12.1 % (ref 11.5–15.5)
WBC: 7.4 10*3/uL (ref 4.0–10.5)

## 2014-07-09 LAB — CK TOTAL AND CKMB (NOT AT ARMC)
CK, MB: 1.2 ng/mL (ref 0.3–4.0)
Relative Index: 0 (ref 0.0–2.5)
Total CK: 2571 U/L — ABNORMAL HIGH (ref 7–177)

## 2014-07-09 MED ORDER — HYDROCODONE-ACETAMINOPHEN 5-325 MG PO TABS
2.0000 | ORAL_TABLET | Freq: Once | ORAL | Status: AC
  Administered 2014-07-09: 2 via ORAL
  Filled 2014-07-09: qty 2

## 2014-07-09 MED ORDER — SODIUM CHLORIDE 0.9 % IV BOLUS (SEPSIS)
1000.0000 mL | Freq: Once | INTRAVENOUS | Status: AC
  Administered 2014-07-09: 1000 mL via INTRAVENOUS

## 2014-07-09 MED ORDER — TRAMADOL HCL 50 MG PO TABS: 50.0000 mg | ORAL_TABLET | Freq: Four times a day (QID) | ORAL | Status: DC | PRN

## 2014-07-09 MED ORDER — NAPROXEN 500 MG PO TABS: 500.0000 mg | ORAL_TABLET | Freq: Two times a day (BID) | ORAL | Status: DC

## 2014-07-09 MED ORDER — SODIUM CHLORIDE 0.9 % IV BOLUS (SEPSIS)
1000.0000 mL | Freq: Once | INTRAVENOUS | Status: AC
  Administered 2014-07-10: 1000 mL via INTRAVENOUS

## 2014-07-09 MED ORDER — KETOROLAC TROMETHAMINE 60 MG/2ML IM SOLN
60.0000 mg | Freq: Once | INTRAMUSCULAR | Status: AC
  Administered 2014-07-09: 60 mg via INTRAMUSCULAR
  Filled 2014-07-09: qty 2

## 2014-07-09 NOTE — ED Notes (Addendum)
C/o bilateral FA (just above elbow to hands) pain, swelling and stiffness, worse with movement. L>R. CMS intact. Onset 1200 noon. Denies fall injury or neck problems. No meds PTA.

## 2014-07-09 NOTE — ED Provider Notes (Signed)
CSN: [id number]     Arrival date & time 07/09/14  2003 History  This chart was scribed for non-physician practitioner, Junius FinnerErin O'Malley, PA-C,working with Mirian MoMatthew Gentry, MD, by Karle PlumberJennifer Tensley, ED Scribe. This patient was seen in room TR05C/TR05C and the patient's care was started at 9:11 PM.  Chief Complaint  Patient presents with  . Arm Swelling  . Arm Pain   Patient is a 23 y.o. female presenting with arm pain. The history is provided by the patient. No language interpreter was used.  Arm Pain   HPI Comments:  Stephanie Gutierrez is a 23 y.o. obese female with PMH of HTN who presents to the Emergency Department complaining of bilateral arm swelling and cramping pain that began upon waking this morning. Pt reports the left arm is worse than the right. She states she was doing housework yesterday but denies any heavy lifting. Pain is constant, aching, cramping and throbbing. States it was so severe her boyfriend had to help her get dressed and brush her teeth this morning. States the pain has brought her to tears. She states she has not taken anything for the pain. Movement of her fingers makes the pain worse. She denies any trauma, injury or fall. Denies leg pain, fever, chills, nausea or vomiting.  Pt denies hx of same. Denies new medications. Denies use of supplements or drugs. Denies other significant PMH.   Past Medical History  Diagnosis Date  . Hypertension    History reviewed. No pertinent past surgical history. Family History  Problem Relation Age of Onset  . Hypertension Mother   . CAD Other   . Diabetes Mellitus II Other    History  Substance Use Topics  . Smoking status: Current Every Day Smoker    Types: Cigarettes  . Smokeless tobacco: Not on file  . Alcohol Use: Yes     Comment: rare   OB History   Grav Para Term Preterm Abortions TAB SAB Ect Mult Living                 Review of Systems  Constitutional: Negative for fever and chills.  Gastrointestinal: Negative for  nausea and vomiting.  Musculoskeletal: Positive for myalgias.  All other systems reviewed and are negative.   Allergies  Review of patient's allergies indicates no known allergies.  Home Medications   Prior to Admission medications   Medication Sig Start Date End Date Taking? Authorizing Provider  naproxen (NAPROSYN) 500 MG tablet Take 1 tablet (500 mg total) by mouth 2 (two) times daily. 07/09/14   Junius FinnerErin O'Malley, PA-C  traMADol (ULTRAM) 50 MG tablet Take 1 tablet (50 mg total) by mouth every 6 (six) hours as needed. 07/09/14   Junius FinnerErin O'Malley, PA-C   Triage Vitals: BP 141/92  Pulse 92  Temp(Src) 98.4 F (36.9 C) (Oral)  Resp 18  Ht 5\' 4"  (1.626 m)  Wt 230 lb (104.327 kg)  BMI 39.46 kg/m2  SpO2 99% Physical Exam  Nursing note and vitals reviewed. Constitutional: She is oriented to person, place, and time. She appears well-developed and well-nourished.  HENT:  Head: Normocephalic and atraumatic.  Eyes: EOM are normal.  Neck: Normal range of motion.  Cardiovascular: Normal rate.   Radial pulses 2+ bilaterally. Cap refill less than 3 seconds.  Pulmonary/Chest: Effort normal.  Musculoskeletal: Normal range of motion.  Bilateral forearm tenderness, left greater than right. Compartments soft, however mild swelling to left forearm versus right. Full ROM bilateral elbows and wrists. Limited ROM of fingers due to  pain. Unable to make fists bilaterally.  Neurological: She is alert and oriented to person, place, and time.  Sensations intact.  Skin: Skin is warm and dry.  No ecchymosis, erythema or red streaking. Skin intact.  Psychiatric: She has a normal mood and affect. Her behavior is normal.    ED Course  Procedures (including critical care time) DIAGNOSTIC STUDIES: Oxygen Saturation is 99% on RA, normal by my interpretation.   COORDINATION OF CARE: 9:17 PM- Will order lab work and speak with Dr. Littie DeedsGentry about appropriate course of treatment. Pt verbalizes understanding and  agrees to plan.  Medications  sodium chloride 0.9 % bolus 1,000 mL (not administered)  HYDROcodone-acetaminophen (NORCO/VICODIN) 5-325 MG per tablet 2 tablet (2 tablets Oral Given 07/09/14 2145)  ketorolac (TORADOL) injection 60 mg (60 mg Intramuscular Given 07/09/14 2144)  sodium chloride 0.9 % bolus 1,000 mL (1,000 mLs Intravenous New Bag/Given 07/10/14 0036)    Labs Review Labs Reviewed  CBC WITH DIFFERENTIAL - Abnormal; Notable for the following:    HCT 35.3 (*)    Lymphocytes Relative 49 (*)    All other components within normal limits  BASIC METABOLIC PANEL - Abnormal; Notable for the following:    GFR calc non Af Amer 89 (*)    All other components within normal limits  CK TOTAL AND CKMB - Abnormal; Notable for the following:    Total CK 2571 (*)    All other components within normal limits  HEPATIC FUNCTION PANEL - Abnormal; Notable for the following:    Total Protein 8.4 (*)    All other components within normal limits  URINALYSIS, ROUTINE W REFLEX MICROSCOPIC  PREGNANCY, URINE  I-STAT CG4 LACTIC ACID, ED    Imaging Review No results found.   EKG Interpretation None      MDM   Final diagnoses:  Bilateral arm pain  Non-traumatic rhabdomyolysis     Pt presenting to ED with severe bilateral forearm pain and stiffness with mild swelling of left forearm.   Due to severe pain, concern for early compartment syndrome vs rhabdomyolysis, although, no good history as to what may have caused symptoms. CK: elevated to 2571  Discussed pt with Dr. Littie DeedsGentry who also examined pt. Will consult medicine to admit pt for IV fluids and observation for recheck CK.     11:01 PM Discussed results with pt and need to admit pt, pt admits she was pushing a couch during her move because it was in the hallway but states she wasn't "straining" to move the couch.    Pt does not have a PCP  11:40 PM Consulted with Dr. Adela Glimpseoutova, hospitalist, who agreed to examine pt for admission and will  be placing admission orders  I personally performed the services described in this documentation, which was scribed in my presence. The recorded information has been reviewed and is accurate.    Junius FinnerErin O'Malley, PA-C 07/10/14 [phone]

## 2014-07-10 ENCOUNTER — Encounter (HOSPITAL_COMMUNITY): Payer: Self-pay | Admitting: Internal Medicine

## 2014-07-10 DIAGNOSIS — M79601 Pain in right arm: Secondary | ICD-10-CM

## 2014-07-10 DIAGNOSIS — F17210 Nicotine dependence, cigarettes, uncomplicated: Secondary | ICD-10-CM | POA: Diagnosis present

## 2014-07-10 DIAGNOSIS — Z79899 Other long term (current) drug therapy: Secondary | ICD-10-CM | POA: Diagnosis not present

## 2014-07-10 DIAGNOSIS — M79603 Pain in arm, unspecified: Secondary | ICD-10-CM | POA: Diagnosis not present

## 2014-07-10 DIAGNOSIS — M79602 Pain in left arm: Secondary | ICD-10-CM

## 2014-07-10 DIAGNOSIS — E669 Obesity, unspecified: Secondary | ICD-10-CM | POA: Diagnosis present

## 2014-07-10 DIAGNOSIS — Z6839 Body mass index (BMI) 39.0-39.9, adult: Secondary | ICD-10-CM | POA: Diagnosis not present

## 2014-07-10 DIAGNOSIS — M6282 Rhabdomyolysis: Principal | ICD-10-CM

## 2014-07-10 DIAGNOSIS — I10 Essential (primary) hypertension: Secondary | ICD-10-CM | POA: Diagnosis present

## 2014-07-10 LAB — COMPREHENSIVE METABOLIC PANEL
ALT: 26 U/L (ref 0–35)
AST: 22 U/L (ref 0–37)
Albumin: 3.3 g/dL — ABNORMAL LOW (ref 3.5–5.2)
Alkaline Phosphatase: 88 U/L (ref 39–117)
Anion gap: 13 (ref 5–15)
BUN: 7 mg/dL (ref 6–23)
CALCIUM: 8.6 mg/dL (ref 8.4–10.5)
CO2: 24 meq/L (ref 19–32)
CREATININE: 0.86 mg/dL (ref 0.50–1.10)
Chloride: 104 mEq/L (ref 96–112)
GLUCOSE: 113 mg/dL — AB (ref 70–99)
Potassium: 3.5 mEq/L — ABNORMAL LOW (ref 3.7–5.3)
SODIUM: 141 meq/L (ref 137–147)
TOTAL PROTEIN: 7.2 g/dL (ref 6.0–8.3)
Total Bilirubin: 0.4 mg/dL (ref 0.3–1.2)

## 2014-07-10 LAB — CBC
HCT: 33.2 % — ABNORMAL LOW (ref 36.0–46.0)
HEMOGLOBIN: 11.1 g/dL — AB (ref 12.0–15.0)
MCH: 27.5 pg (ref 26.0–34.0)
MCHC: 33.4 g/dL (ref 30.0–36.0)
MCV: 82.4 fL (ref 78.0–100.0)
Platelets: 281 10*3/uL (ref 150–400)
RBC: 4.03 MIL/uL (ref 3.87–5.11)
RDW: 12.3 % (ref 11.5–15.5)
WBC: 7.1 10*3/uL (ref 4.0–10.5)

## 2014-07-10 LAB — URINALYSIS, ROUTINE W REFLEX MICROSCOPIC
Bilirubin Urine: NEGATIVE
Glucose, UA: NEGATIVE mg/dL
Hgb urine dipstick: NEGATIVE
Ketones, ur: NEGATIVE mg/dL
Leukocytes, UA: NEGATIVE
NITRITE: NEGATIVE
Protein, ur: NEGATIVE mg/dL
SPECIFIC GRAVITY, URINE: 1.021 (ref 1.005–1.030)
Urobilinogen, UA: 0.2 mg/dL (ref 0.0–1.0)
pH: 5 (ref 5.0–8.0)

## 2014-07-10 LAB — HEPATIC FUNCTION PANEL
ALT: 33 U/L (ref 0–35)
AST: 25 U/L (ref 0–37)
Albumin: 4 g/dL (ref 3.5–5.2)
Alkaline Phosphatase: 108 U/L (ref 39–117)
Bilirubin, Direct: <0.2 mg/dL (ref 0.0–0.3)
Total Bilirubin: 0.3 mg/dL (ref 0.3–1.2)
Total Protein: 8.4 g/dL — ABNORMAL HIGH (ref 6.0–8.3)

## 2014-07-10 LAB — TSH: TSH: 2.3 u[IU]/mL (ref 0.350–4.500)

## 2014-07-10 LAB — CK
CK TOTAL: 2459 U/L — AB (ref 7–177)
Total CK: 2541 U/L — ABNORMAL HIGH (ref 7–177)

## 2014-07-10 LAB — HIV ANTIBODY (ROUTINE TESTING W REFLEX): HIV: NONREACTIVE

## 2014-07-10 LAB — PHOSPHORUS: Phosphorus: 3.7 mg/dL (ref 2.3–4.6)

## 2014-07-10 LAB — MAGNESIUM: MAGNESIUM: 2.2 mg/dL (ref 1.5–2.5)

## 2014-07-10 LAB — PREGNANCY, URINE: PREG TEST UR: NEGATIVE

## 2014-07-10 MED ORDER — NAPROXEN 500 MG PO TABS: 500.0000 mg | ORAL_TABLET | Freq: Three times a day (TID) | ORAL | Status: DC | PRN

## 2014-07-10 MED ORDER — ACETAMINOPHEN 325 MG PO TABS: 650.0000 mg | ORAL_TABLET | Freq: Four times a day (QID) | ORAL | Status: DC | PRN

## 2014-07-10 MED ORDER — HYDROCODONE-ACETAMINOPHEN 5-325 MG PO TABS
1.0000 | ORAL_TABLET | ORAL | Status: DC | PRN
  Administered 2014-07-10 (×2): 2 via ORAL
  Administered 2014-07-10: 1 via ORAL
  Administered 2014-07-10: 2 via ORAL
  Filled 2014-07-10: qty 2
  Filled 2014-07-10: qty 1
  Filled 2014-07-10 (×2): qty 2

## 2014-07-10 MED ORDER — POTASSIUM CHLORIDE CRYS ER 20 MEQ PO TBCR
40.0000 meq | EXTENDED_RELEASE_TABLET | Freq: Once | ORAL | Status: AC
  Administered 2014-07-10: 40 meq via ORAL
  Filled 2014-07-10: qty 2

## 2014-07-10 MED ORDER — SODIUM CHLORIDE 0.9 % IV SOLN
INTRAVENOUS | Status: AC
  Administered 2014-07-10: 03:00:00 via INTRAVENOUS

## 2014-07-10 MED ORDER — ONDANSETRON HCL 4 MG/2ML IJ SOLN: 4.0000 mg | Freq: Four times a day (QID) | INTRAMUSCULAR | Status: DC | PRN

## 2014-07-10 MED ORDER — ONDANSETRON HCL 4 MG PO TABS: 4.0000 mg | ORAL_TABLET | Freq: Four times a day (QID) | ORAL | Status: DC | PRN

## 2014-07-10 MED ORDER — ALUM & MAG HYDROXIDE-SIMETH 200-200-20 MG/5ML PO SUSP
30.0000 mL | Freq: Four times a day (QID) | ORAL | Status: DC | PRN
  Administered 2014-07-10: 30 mL via ORAL
  Filled 2014-07-10: qty 30

## 2014-07-10 MED ORDER — ACETAMINOPHEN 650 MG RE SUPP: 650.0000 mg | Freq: Four times a day (QID) | RECTAL | Status: DC | PRN

## 2014-07-10 MED ORDER — DOCUSATE SODIUM 100 MG PO CAPS
100.0000 mg | ORAL_CAPSULE | Freq: Two times a day (BID) | ORAL | Status: DC
  Administered 2014-07-10: 100 mg via ORAL
  Filled 2014-07-10 (×2): qty 1

## 2014-07-10 NOTE — Progress Notes (Signed)
IV team called because right a.c. Catheter was causing pain. Required ultrasound to insert new IV. Pt was unable to tolerate flow above 75 cc. She complained of pain and stinging  when IV fluids ran at higher rates. Currently NS is infusing at 75 cc/

## 2014-07-10 NOTE — Progress Notes (Signed)
Utilization review completed.  

## 2014-07-10 NOTE — ED Notes (Signed)
Admitting MD at bedside.

## 2014-07-10 NOTE — Discharge Instructions (Signed)
Follow with Primary MD or nearest urgent care in 7 days .   Get CBC, CMP, CK levels checked next visit.   Keep both arms elevated for the next 3-4 days. Avoid any heavy lifting (over 2 pounds) for the next 1 week. Avoid exertional activity for the next 1 week.   Activity: As tolerated with Full fall precautions use walker/cane & assistance as needed   Disposition Home     Diet: Heart Healthy, keeping yourself well hydrated  For Heart failure patients - Check your Weight same time everyday, if you gain over 2 pounds, or you develop in leg swelling, experience more shortness of breath or chest pain, call your Primary MD immediately. Follow Cardiac Low Salt Diet and 1.8 lit/day fluid restriction.   On your next visit with your primary care physician please Get Medicines reviewed and adjusted.   Please request your Prim.MD to go over all Hospital Tests and Procedure/Radiological results at the follow up, please get all Hospital records sent to your Prim MD by signing hospital release before you go home.   If you experience worsening of your admission symptoms, develop shortness of breath, life threatening emergency, suicidal or homicidal thoughts you must seek medical attention immediately by calling 911 or calling your MD immediately  if symptoms less severe.  You Must read complete instructions/literature along with all the possible adverse reactions/side effects for all the Medicines you take and that have been prescribed to you. Take any new Medicines after you have completely understood and accpet all the possible adverse reactions/side effects.   Do not drive, operating heavy machinery, perform activities at heights, swimming or participation in water activities or provide baby sitting services if your were admitted for syncope or siezures until you have seen by Primary MD or a Neurologist and advised to do so again.  Do not drive when taking Pain medications.    Do not take more  than prescribed Pain, Sleep and Anxiety Medications  Special Instructions: If you have smoked or chewed Tobacco  in the last 2 yrs please stop smoking, stop any regular Alcohol  and or any Recreational drug use.  Wear Seat belts while driving.   Please note  You were cared for by a hospitalist during your hospital stay. If you have any questions about your discharge medications or the care you received while you were in the hospital after you are discharged, you can call the unit and asked to speak with the hospitalist on call if the hospitalist that took care of you is not available. Once you are discharged, your primary care physician will handle any further medical issues. Please note that NO REFILLS for any discharge medications will be authorized once you are discharged, as it is imperative that you return to your primary care physician (or establish a relationship with a primary care physician if you do not have one) for your aftercare needs so that they can reassess your need for medications and monitor your lab values.

## 2014-07-10 NOTE — Progress Notes (Signed)
Patient provided with discharge instructions and follow up information. Prescription sent to pharmacy. She is going home at this time with her transport.

## 2014-07-10 NOTE — Progress Notes (Signed)
Dear Doctor:  This patient has been identified as a candidate for PICC for the following reason (s): poor veins/poor circulatory system (CHF, COPD, emphysema, diabetes, steroid use, IV drug abuse, etc.) If you agree, please write an order for the indicated device. For any questions contact the Vascular Access Team at [phone] if no answer, please leave a message.  Thank you for supporting the early vascular access assessment program. 

## 2014-07-10 NOTE — H&P (Signed)
PCP: None    Chief Complaint:  Bilateral forearm pain tenderness and swelling  HPI: Stephanie Gutierrez is a 23 y.o. female   has a past medical history of Hypertension.   Presented with  Patient reports she has recently undergone a move in was helping her mother move a couch. She will Morning with pain and swelling of her forearms. She denies any exercise. Denies drug use. Denies prior history of this. She presented to mother's department and was found to have CK elevated at 2500 with normal creatinine, normal LFTs and lactic acid.  Hospitalist was called for admission for nontraumatic rhabdomyolysis  Review of Systems:    Pertinent positives include: arm pain  Constitutional:  No weight loss, night sweats, Fevers, chills, fatigue, weight loss  HEENT:  No headaches, Difficulty swallowing,Tooth/dental problems,Sore throat,  No sneezing, itching, ear ache, nasal congestion, post nasal drip,  Cardio-vascular:  No chest pain, Orthopnea, PND, anasarca, dizziness, palpitations.no Bilateral lower extremity swelling  GI:  No heartburn, indigestion, abdominal pain, nausea, vomiting, diarrhea, change in bowel habits, loss of appetite, melena, blood in stool, hematemesis Resp:  no shortness of breath at rest. No dyspnea on exertion, No excess mucus, no productive cough, No non-productive cough, No coughing up of blood.No change in color of mucus.No wheezing. Skin:  no rash or lesions. No jaundice GU:  no dysuria, change in color of urine, no urgency or frequency. No straining to urinate.  No flank pain.  Musculoskeletal:  No joint pain or no joint swelling. No decreased range of motion. No back pain.  Psych:  No change in mood or affect. No depression or anxiety. No memory loss.  Neuro: no localizing neurological complaints, no tingling, no weakness, no double vision, no gait abnormality, no slurred speech, no confusion  Otherwise ROS are negative except for above, 10 systems were  reviewed  Past Medical History: Past Medical History  Diagnosis Date  . Hypertension    History reviewed. No pertinent past surgical history.   Medications: Prior to Admission medications   Medication Sig Start Date End Date Taking? Authorizing Provider  naproxen (NAPROSYN) 500 MG tablet Take 1 tablet (500 mg total) by mouth 2 (two) times daily. 07/09/14   Junius Finner, PA-C  traMADol (ULTRAM) 50 MG tablet Take 1 tablet (50 mg total) by mouth every 6 (six) hours as needed. 07/09/14   Junius Finner, PA-C    Allergies:  No Known Allergies  Social History:  Ambulatory   Independently  Lives at home alone,    With family     reports that she has been smoking Cigarettes.  She has been smoking about 0.00 packs per day. She does not have any smokeless tobacco history on file. She reports that she drinks alcohol. She reports that she does not use illicit drugs.    Family History: family history includes CAD in her other; Diabetes Mellitus II in her other; Hypertension in her mother.    Physical Exam: Patient Vitals for the past 24 hrs:  BP Temp Temp src Pulse Resp SpO2 Height Weight  07/09/14 2022 141/92 mmHg 98.4 F (36.9 C) Oral 92 18 99 % 5\' 4"  (1.626 m) 104.327 kg (230 lb)    1. General:  in No Acute distress 2. Psychological: Alert and  Oriented 3. Head/ENT:   Moist   Mucous Membranes                          Head Non  traumatic, neck supple                          Normal  Dentition 4. SKIN: normal Skin turgor,  Skin clean Dry and intact no rash, forearms swollen bilaterally no warmth.  5. Heart: Regular rate and rhythm no Murmur, Rub or gallop 6. Lungs: Clear to auscultation bilaterally, no wheezes or crackles   7. Abdomen: Soft, non-tender, Non distended, obese 8. Lower extremities: no clubbing, cyanosis, or edema 9. Neurologically Grossly intact, moving all 4 extremities equally 10. MSK: Normal range of motion  body mass index is 39.46 kg/(m^2).   Labs on  Admission:   Results for orders placed during the hospital encounter of 07/09/14 (from the past 24 hour(s))  CBC WITH DIFFERENTIAL     Status: Abnormal   Collection Time    07/09/14  9:31 PM      Result Value Ref Range   WBC 7.4  4.0 - 10.5 K/uL   RBC 4.41  3.87 - 5.11 MIL/uL   Hemoglobin 12.2  12.0 - 15.0 g/dL   HCT 62.135.3 (*) 30.836.0 - 65.746.0 %   MCV 80.0  78.0 - 100.0 fL   MCH 27.7  26.0 - 34.0 pg   MCHC 34.6  30.0 - 36.0 g/dL   RDW 84.612.1  96.211.5 - 95.215.5 %   Platelets 302  150 - 400 K/uL   Neutrophils Relative % 45  43 - 77 %   Neutro Abs 3.3  1.7 - 7.7 K/uL   Lymphocytes Relative 49 (*) 12 - 46 %   Lymphs Abs 3.6  0.7 - 4.0 K/uL   Monocytes Relative 5  3 - 12 %   Monocytes Absolute 0.4  0.1 - 1.0 K/uL   Eosinophils Relative 1  0 - 5 %   Eosinophils Absolute 0.1  0.0 - 0.7 K/uL   Basophils Relative 0  0 - 1 %   Basophils Absolute 0.0  0.0 - 0.1 K/uL  BASIC METABOLIC PANEL     Status: Abnormal   Collection Time    07/09/14  9:31 PM      Result Value Ref Range   Sodium 140  137 - 147 mEq/L   Potassium 3.7  3.7 - 5.3 mEq/L   Chloride 102  96 - 112 mEq/L   CO2 25  19 - 32 mEq/L   Glucose, Bld 90  70 - 99 mg/dL   BUN 7  6 - 23 mg/dL   Creatinine, Ser 8.410.90  0.50 - 1.10 mg/dL   Calcium 9.5  8.4 - 32.410.5 mg/dL   GFR calc non Af Amer 89 (*) >90 mL/min   GFR calc Af Amer >90  >90 mL/min   Anion gap 13  5 - 15  CK TOTAL AND CKMB     Status: Abnormal   Collection Time    07/09/14  9:31 PM      Result Value Ref Range   Total CK 2571 (*) 7 - 177 U/L   CK, MB 1.2  0.3 - 4.0 ng/mL   Relative Index 0.0  0.0 - 2.5  HEPATIC FUNCTION PANEL     Status: Abnormal   Collection Time    07/09/14  9:31 PM      Result Value Ref Range   Total Protein 8.4 (*) 6.0 - 8.3 g/dL   Albumin 4.0  3.5 - 5.2 g/dL   AST 25  0 - 37 U/L   ALT  33  0 - 35 U/L   Alkaline Phosphatase 108  39 - 117 U/L   Total Bilirubin 0.3  0.3 - 1.2 mg/dL   Bilirubin, Direct <0.2<0.2  0.0 - 0.3 mg/dL   Indirect Bilirubin NOT  CALCULATED  0.3 - 0.9 mg/dL  I-STAT CG4 LACTIC ACID, ED     Status: None   Collection Time    07/09/14  9:45 PM      Result Value Ref Range   Lactic Acid, Venous 1.12  0.5 - 2.2 mmol/L    No results found for this basename: HGBA1C    Estimated Creatinine Clearance: 114.3 ml/min (by C-G formula based on Cr of 0.9).  BNP (last 3 results)  Recent Labs  09/19/13 0033  PROBNP 16.5     Filed Weights   07/09/14 2022  Weight: 104.327 kg (230 lb)     Cultures:    Component Value Date/Time   SDES URINE, CLEAN CATCH 04/19/2013 0319   SPECREQUEST CX ADDED 0346 ON [id number] 04/19/2013 0319   CULT  Value: NO GROWTH Performed at [facility] 04/19/2013 0319   REPTSTATUS 04/20/2013 FINAL 04/19/2013 0319     Radiological Exams on Admission: No results found.  Chart has been reviewed  Assessment/Plan  23 year old female with past history of mild hypertension not on any current medications presents with bilateral upper extremity swelling in the setting of elevated CK after exertion consistent nontraumatic rhabdomyolysis  Present on Admission:  . Rhabdomyolysis - admit administer IV fluids follow CK level. Obtain urine Question history hypertension currently blood pressure stable continue to monitor   Prophylaxis: SCD CODE STATUS:  FULL CODE   Other plan as per orders.  I have spent a total of 55 min on this admission  Taylie Helder 07/10/2014, 1:21 AM  [facility]  Pager [phone]   after 2 AM please page floor coverage PA If 7AM-7PM, please contact the day team taking care of the patient  [email or web address]  Password [email or web address]

## 2014-07-10 NOTE — Discharge Summary (Signed)
Stephanie NossMariah Gutierrez, is a 23 y.o. female  DOB 09/25/1990  MRN [id number].  Admission date:  07/09/2014  Admitting Physician  Therisa DoyneAnastassia Doutova, MD  Discharge Date:  07/10/2014   Primary MD  No PCP Per Patient  Recommendations for primary care physician for things to follow:   Check CBC, CMP and a CK level in 7-10 days.   Admission Diagnosis  Bilateral arm pain [M79.601, M79.602] Non-traumatic rhabdomyolysis [M62.82]   Discharge Diagnosis  Bilateral arm pain [M79.601, M79.602] Non-traumatic rhabdomyolysis [M62.82]    Active Problems:   Rhabdomyolysis      Past Medical History  Diagnosis Date  . Hypertension     History reviewed. No pertinent past surgical history.     History of present illness and  Hospital Course:     Kindly see H&P for history of present illness and admission details, please review complete Labs, Consult reports and Test reports for all details in brief  HPI  from the history and physical done on the day of admission  Patient reports she has recently undergone a move in was helping her mother move a couch. She will Morning with pain and swelling of her forearms. She denies any exercise. Denies drug use. Denies prior history of this. She presented to mother's department and was found to have CK elevated at 2500 with normal creatinine, normal LFTs and lactic acid.    Hospital Course    1. Mild rhabdomyolysis with improving CK levels, normal LFTs and renal function. Secondary to moving heavy boxes, much improved, examination of both arms stable, good sensation grip strength in both arms and hands, minimal to no edema, no signs of compartment syndrome. She feels much better. Will be discharged home on NSAID when necessary along with good hydration. Requested to follow with PCP or urgent care in a week to  get repeat CBC-CMP and a CK level checked. Activity instructions provided.   2. Low K. Replaced.    Discharge Condition: stable   Follow UP  Follow-up Information   Follow up with [facility]    . (Call to schedule appointment to establish care with a primary care provider for ongoing healthcare needs.)    Contact information:   [address] [place] [phone]        Discharge Instructions  and  Discharge Medications      Discharge Instructions   Diet - low sodium heart healthy    Complete by:  As directed      Discharge instructions    Complete by:  As directed   Follow with Primary MD or nearest urgent care in 7 days .   Get CBC, CMP, CK levels checked next visit.   Keep both arms elevated for the next 3-4 days. Avoid any heavy lifting (over 2 pounds) for the next 1 week. Avoid exertional activity for the next 1 week.   Activity: As tolerated with Full fall precautions use walker/cane & assistance as needed   Disposition Home  Diet: Heart Healthy, keeping yourself well hydrated  For Heart failure patients - Check your Weight same time everyday, if you gain over 2 pounds, or you develop in leg swelling, experience more shortness of breath or chest pain, call your Primary MD immediately. Follow Cardiac Low Salt Diet and 1.8 lit/day fluid restriction.   On your next visit with your primary care physician please Get Medicines reviewed and adjusted.   Please request your Prim.MD to go over all Hospital Tests and Procedure/Radiological results at the follow up, please get all Hospital records sent to your Prim MD by signing hospital release before you go home.   If you experience worsening of your admission symptoms, develop shortness of breath, life threatening emergency, suicidal or homicidal thoughts you must seek medical attention immediately by calling 911 or calling your MD immediately  if symptoms less  severe.  You Must read complete instructions/literature along with all the possible adverse reactions/side effects for all the Medicines you take and that have been prescribed to you. Take any new Medicines after you have completely understood and accpet all the possible adverse reactions/side effects.   Do not drive, operating heavy machinery, perform activities at heights, swimming or participation in water activities or provide baby sitting services if your were admitted for syncope or siezures until you have seen by Primary MD or a Neurologist and advised to do so again.  Do not drive when taking Pain medications.    Do not take more than prescribed Pain, Sleep and Anxiety Medications  Special Instructions: If you have smoked or chewed Tobacco  in the last 2 yrs please stop smoking, stop any regular Alcohol  and or any Recreational drug use.  Wear Seat belts while driving.   Please note  You were cared for by a hospitalist during your hospital stay. If you have any questions about your discharge medications or the care you received while you were in the hospital after you are discharged, you can call the unit and asked to speak with the hospitalist on call if the hospitalist that took care of you is not available. Once you are discharged, your primary care physician will handle any further medical issues. Please note that NO REFILLS for any discharge medications will be authorized once you are discharged, as it is imperative that you return to your primary care physician (or establish a relationship with a primary care physician if you do not have one) for your aftercare needs so that they can reassess your need for medications and monitor your lab values.     Increase activity slowly    Complete by:  As directed             Medication List         naproxen 500 MG tablet  Commonly known as:  NAPROSYN  Take 1 tablet (500 mg total) by mouth every 8 (eight) hours as needed for  moderate pain.          Diet and Activity recommendation: See Discharge Instructions above   Consults obtained - none   Major procedures and Radiology Reports - PLEASE review detailed and final reports for all details, in brief -   none   No results found.  Micro Results      No results found for this or any previous visit (from the past 240 hour(s)).     Today   Subjective:   Stephanie Gutierrez today has no headache,no chest abdominal pain,no new weakness tingling or  numbness, feels much better wants to go home today.   Objective:   Blood pressure 120/63, pulse 65, temperature 98 F (36.7 C), temperature source Oral, resp. rate 18, height 5\' 3"  (1.6 m), weight 104.327 kg (230 lb), SpO2 99.00%.   Intake/Output Summary (Last 24 hours) at 07/10/14 1002 Last data filed at 07/10/14 0600  Gross per 24 hour  Intake    685 ml  Output      0 ml  Net    685 ml    Exam Awake Alert, Oriented x 3, No new F.N deficits, Normal affect [place].AT,PERRAL Supple Neck,No JVD, No cervical lymphadenopathy appriciated.  Symmetrical Chest wall movement, Good air movement bilaterally, CTAB RRR,No Gallops,Rubs or new Murmurs, No Parasternal Heave +ve B.Sounds, Abd Soft, Non tender, No organomegaly appriciated, No rebound -guarding or rigidity. No Cyanosis, Clubbing or edema, No new Rash or bruise, no signs of compartment syndrome in either arm, minimal tenderness in left arm. Minimal to no swelling in both arms. Good sensation, grip strength in both hands.  Data Review   CBC w Diff: Lab Results  Component Value Date   WBC 7.1 07/10/2014   HGB 11.1* 07/10/2014   HCT 33.2* 07/10/2014   PLT 281 07/10/2014   LYMPHOPCT 49* 07/09/2014   MONOPCT 5 07/09/2014   EOSPCT 1 07/09/2014   BASOPCT 0 07/09/2014    CMP: Lab Results  Component Value Date   NA 141 07/10/2014   K 3.5* 07/10/2014   CL 104 07/10/2014   CO2 24 07/10/2014   BUN 7 07/10/2014   CREATININE 0.86 07/10/2014   PROT 7.2  07/10/2014   ALBUMIN 3.3* 07/10/2014   BILITOT 0.4 07/10/2014   ALKPHOS 88 07/10/2014   AST 22 07/10/2014   ALT 26 07/10/2014  .   Total Time in preparing paper work, data evaluation and todays exam - 35 minutes  Leroy SeaSINGH,Vanilla Heatherington K M.D on 07/10/2014 at 10:02 AM  [facility] Office  [phone]

## 2014-07-10 NOTE — Progress Notes (Signed)
Pt fluids running at 4675ml/ hour due to inability to tolerate fluid rate of 16350ml/hour. Complaints of pain overnight when fluid was running at such a fast rate. Will attempt to titrate up to ordered dose as patient can tolerate.

## 2014-07-11 NOTE — ED Provider Notes (Signed)
Medical screening examination/treatment/procedure(s) were conducted as a shared visit with non-physician practitioner(s) and myself.  I personally evaluated the patient during the encounter.   EKG Interpretation None       Briefly, pt is a 23 y.o. female presenting with spontaneous atraumatic bil forearm pain.  I performed an examination on the patient including cardiac, pulmonary, and gi systems which were unremarkable.  Pt has 2+ pulses in bil UE, with swollen but nontense compartments.  Labwork demonstrated elevated CK.  No other symptoms in other extremities or other symptoms.  Consulted hospitalist for admission.     Mirian MoMatthew Gentry, MD 07/11/14 [phone]

## 2014-09-16 ENCOUNTER — Encounter (HOSPITAL_COMMUNITY): Payer: Self-pay | Admitting: Emergency Medicine

## 2014-09-16 ENCOUNTER — Emergency Department (HOSPITAL_COMMUNITY)
Admission: EM | Admit: 2014-09-16 | Discharge: 2014-09-16 | Disposition: A | Discharge status: Home / Self Care | Payer: Medicaid Other | Attending: Emergency Medicine | Admitting: Emergency Medicine

## 2014-09-16 DIAGNOSIS — M778 Other enthesopathies, not elsewhere classified: Secondary | ICD-10-CM

## 2014-09-16 DIAGNOSIS — Z720 Tobacco use: Secondary | ICD-10-CM | POA: Insufficient documentation

## 2014-09-16 DIAGNOSIS — M65842 Other synovitis and tenosynovitis, left hand: Secondary | ICD-10-CM | POA: Insufficient documentation

## 2014-09-16 DIAGNOSIS — I10 Essential (primary) hypertension: Secondary | ICD-10-CM | POA: Insufficient documentation

## 2014-09-16 DIAGNOSIS — M79602 Pain in left arm: Secondary | ICD-10-CM | POA: Diagnosis present

## 2014-09-16 LAB — CBC WITH DIFFERENTIAL/PLATELET
Basophils Absolute: 0 10*3/uL (ref 0.0–0.1)
Basophils Relative: 0 % (ref 0–1)
EOS ABS: 0.1 10*3/uL (ref 0.0–0.7)
Eosinophils Relative: 1 % (ref 0–5)
HCT: 38.1 % (ref 36.0–46.0)
HEMOGLOBIN: 12.4 g/dL (ref 12.0–15.0)
LYMPHS ABS: 4.1 10*3/uL — AB (ref 0.7–4.0)
Lymphocytes Relative: 54 % — ABNORMAL HIGH (ref 12–46)
MCH: 26.9 pg (ref 26.0–34.0)
MCHC: 32.5 g/dL (ref 30.0–36.0)
MCV: 82.6 fL (ref 78.0–100.0)
MONOS PCT: 5 % (ref 3–12)
Monocytes Absolute: 0.3 10*3/uL (ref 0.1–1.0)
Neutro Abs: 3.1 10*3/uL (ref 1.7–7.7)
Neutrophils Relative %: 40 % — ABNORMAL LOW (ref 43–77)
Platelets: 248 10*3/uL (ref 150–400)
RBC: 4.61 MIL/uL (ref 3.87–5.11)
RDW: 13 % (ref 11.5–15.5)
WBC: 7.6 10*3/uL (ref 4.0–10.5)

## 2014-09-16 LAB — COMPREHENSIVE METABOLIC PANEL
ALBUMIN: 4 g/dL (ref 3.5–5.2)
ALT: 28 U/L (ref 0–35)
AST: 22 U/L (ref 0–37)
Alkaline Phosphatase: 71 U/L (ref 39–117)
Anion gap: 9 (ref 5–15)
BUN: 8 mg/dL (ref 6–23)
CALCIUM: 9.3 mg/dL (ref 8.4–10.5)
CO2: 25 mmol/L (ref 19–32)
CREATININE: 0.92 mg/dL (ref 0.50–1.10)
Chloride: 105 mEq/L (ref 96–112)
GFR calc Af Amer: >90 mL/min (ref >90)
GFR calc non Af Amer: 87 mL/min — ABNORMAL LOW (ref >90)
Glucose, Bld: 99 mg/dL (ref 70–99)
Potassium: 3.5 mmol/L (ref 3.5–5.1)
SODIUM: 139 mmol/L (ref 135–145)
TOTAL PROTEIN: 7.4 g/dL (ref 6.0–8.3)
Total Bilirubin: 0.4 mg/dL (ref 0.3–1.2)

## 2014-09-16 LAB — CK: CK TOTAL: 145 U/L (ref 7–177)

## 2014-09-16 MED ORDER — HYDROCODONE-ACETAMINOPHEN 5-325 MG PO TABS
2.0000 | ORAL_TABLET | Freq: Once | ORAL | Status: AC
  Administered 2014-09-16: 2 via ORAL
  Filled 2014-09-16: qty 2

## 2014-09-16 MED ORDER — HYDROCODONE-ACETAMINOPHEN 5-325 MG PO TABS: 1.0000 | ORAL_TABLET | Freq: Four times a day (QID) | ORAL | Status: DC | PRN

## 2014-09-16 MED ORDER — NAPROXEN 500 MG PO TABS: 500.0000 mg | ORAL_TABLET | Freq: Two times a day (BID) | ORAL | Status: DC

## 2014-09-16 MED ORDER — METHOCARBAMOL 500 MG PO TABS
1000.0000 mg | ORAL_TABLET | Freq: Once | ORAL | Status: AC
  Administered 2014-09-16: 1000 mg via ORAL
  Filled 2014-09-16: qty 2

## 2014-09-16 NOTE — ED Provider Notes (Signed)
CSN: [id number]     Arrival date & time 09/16/14  0012 History  This chart was scribed for Stephanie Mackie, MD by Gwenyth Ober, ED Scribe. This patient was seen in room A12C/A12C and the patient's care was started at 1:39 AM.    Chief Complaint  Patient presents with  . Arm Pain   The history is provided by the patient. No language interpreter was used.   HPI Comments: Stephanie Gutierrez is a 24 y.o. female with a history of HTN who presents to the Emergency Department complaining of constant, shooting left forearm pain that radiates to her left middle finger and left upper arm and started 3 days ago. She notes muscle tightness when she moves the affected areas. Pt has tried Ibuprofen and Aleve with no relief to symptoms. Pt notes possible injury while moving her couch or working. 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 Pt has a history of similar pain in her bilateral arms that occurred in October when she was diagnosed with rhabdomyolysis. She is left-handed. Pt denies changes in urine, fevers, chills, CP and SOB as associated symptoms.  Past Medical History  Diagnosis Date  . Hypertension    History reviewed. No pertinent past surgical history. Family History  Problem Relation Age of Onset  . Hypertension Mother   . CAD Other   . Diabetes Mellitus II Other    History  Substance Use Topics  . Smoking status: Current Every Day Smoker    Types: Cigarettes  . Smokeless tobacco: Not on file  . Alcohol Use: Yes     Comment: rare   OB History    No data available     Review of Systems  Constitutional: Negative for fever and chills.  Respiratory: Negative for shortness of breath.   Cardiovascular: Negative for chest pain.  Genitourinary: Negative for dysuria.  Musculoskeletal: Positive for arthralgias.  Skin: Negative for wound.  All other systems reviewed and are negative.     Allergies  Review of patient's allergies indicates no known allergies.  Home  Medications   Prior to Admission medications   Medication Sig Start Date End Date Taking? Authorizing Provider  naproxen (NAPROSYN) 500 MG tablet Take 1 tablet (500 mg total) by mouth every 8 (eight) hours as needed for moderate pain. 07/10/14   Leroy Sea, MD   BP 130/78 mmHg  Pulse 84  Temp(Src) 98.3 F (36.8 C) (Oral)  Resp 15  Ht  (1.6 m)  Wt 230 lb (104.327 kg)  BMI 40.75 kg/m2  SpO2 98%  LMP  Physical Exam  Constitutional: She is oriented to person, place, and time. She appears well-developed and well-nourished. She appears distressed (uncomfortable appearing).  HENT:  Head: Normocephalic and atraumatic.  Nose: Nose normal.  Mouth/Throat: Oropharynx is clear and moist.  Eyes: Conjunctivae and EOM are normal. Pupils are equal, round, and reactive to light.  Neck: Normal range of motion. Neck supple. No JVD present. No tracheal deviation present. No thyromegaly present.  Cardiovascular: Normal rate, regular rhythm, normal heart sounds and intact distal pulses.  Exam reveals no gallop and no friction rub.   No murmur heard. Pulmonary/Chest: Effort normal and breath sounds normal. No stridor. No respiratory distress. She has no wheezes. She has no rales. She exhibits no tenderness.  Abdominal: Soft. Bowel sounds are normal. She exhibits no distension and no mass. There is no tenderness. There is no rebound and no guarding.  Genitourinary: Vaginal discharge: patient  has tenderness to palpation over the flexor carpi radialis, brachioradialis and palmaris longus tendons.  Musculoskeletal: She exhibits tenderness.  No edema, compartments are soft.  Pain with range of motion at the wrist and with palpation over the tendons.  Lymphadenopathy:    She has no cervical adenopathy.  Neurological: She is alert and oriented to person, place, and time. She displays normal reflexes. She exhibits normal muscle tone. Coordination normal.  Skin: Skin is warm and dry. No rash noted. No  erythema. No pallor.  Psychiatric: She has a normal mood and affect. Her behavior is normal. Judgment and thought content normal.  Nursing note and vitals reviewed.   ED Course  Procedures (including critical care time) DIAGNOSTIC STUDIES: Oxygen Saturation is 98% on RA, normal by my interpretation.    COORDINATION OF CARE: 1:46 AM Discussed treatment plan with pt which includes lab work. Pt agreed to plan.    Labs Review Labs Reviewed - No data to display  Imaging Review No results found.   EKG Interpretation None     Results for orders placed or performed during the hospital encounter of 09/16/14  CBC with Differential  Result Value Ref Range   WBC 7.6 4.0 - 10.5 K/uL   RBC 4.61 3.87 - 5.11 MIL/uL   Hemoglobin 12.4 12.0 - 15.0 g/dL   HCT 16.1 09.6 - 04.5 %   MCV 82.6 78.0 - 100.0 fL   MCH 26.9 26.0 - 34.0 pg   MCHC 32.5 30.0 - 36.0 g/dL   RDW 40.9 81.1 - 91.4 %   Platelets 248 150 - 400 K/uL   Neutrophils Relative % 40 (L) 43 - 77 %   Neutro Abs 3.1 1.7 - 7.7 K/uL   Lymphocytes Relative 54 (H) 12 - 46 %   Lymphs Abs 4.1 (H) 0.7 - 4.0 K/uL   Monocytes Relative 5 3 - 12 %   Monocytes Absolute 0.3 0.1 - 1.0 K/uL   Eosinophils Relative 1 0 - 5 %   Eosinophils Absolute 0.1 0.0 - 0.7 K/uL   Basophils Relative 0 0 - 1 %   Basophils Absolute 0.0 0.0 - 0.1 K/uL  CK  Result Value Ref Range   Total CK 145 7 - 177 U/L  Comprehensive metabolic panel  Result Value Ref Range   Sodium 139 135 - 145 mmol/L   Potassium 3.5 3.5 - 5.1 mmol/L   Chloride 105 96 - 112 mEq/L   CO2 25 19 - 32 mmol/L   Glucose, Bld 99 70 - 99 mg/dL   BUN 8 6 - 23 mg/dL   Creatinine, Ser 7.82 0.50 - 1.10 mg/dL   Calcium 9.3 8.4 - 95.6 mg/dL   Total Protein 7.4 6.0 - 8.3 g/dL   Albumin 4.0 3.5 - 5.2 g/dL   AST 22 0 - 37 U/L   ALT 28 0 - 35 U/L   Alkaline Phosphatase 71 39 - 117 U/L   Total Bilirubin 0.4 0.3 - 1.2 mg/dL   GFR calc non Af Amer 87 (L) >90 mL/min   GFR calc Af Amer >90 >90 mL/min    Anion gap 9 5 - 15   No results found.   MDM   Final diagnoses:  Left wrist tendonitis   24 year old female with left arm pain, history of rhabdomyolysis several months ago.  Compartments are soft, pain mainly over the tendons of the wrist and forearm on the volar surface.  Given prior history of rhabdo, will check labs.  Will provide  pain control.   I personally performed the services described in this documentation, which was scribed in my presence. The recorded information has been reviewed and is accurate.  Labs without indication of rhabdo.  Plan for discharge home with Velcro splint. '   Stephanie Mackie, MD 09/16/14 [phone]

## 2014-09-16 NOTE — Discharge Instructions (Signed)
Take medications as prescribed.  Wear splint for one week, remove 2-3 times a day and gently bend your wrist in all directions.  Follow up with your doctor for recheck in 1-2 weeks.   Repetitive Strain Injuries Repetitive strain injuries (RSIs) result from overuse or misuse of soft tissues including muscles, tendons, or nerves. Tendons are the cord-like structures that attach muscles to bones. RSIs can affect almost any part of the body. However, RSIs are most common in the arms (thumbs, wrists, elbows, shoulders) and legs (ankles, knees). Common medical conditions that are often caused by repetitive strain include carpal tunnel syndrome, tennis or golfer's elbow, bursitis, and tendonitis. If RSIs are treated early, and therepeated activity is reduced or removed, the severity and length of your problems can usually be reduced. RSIs are also called cumulative trauma disorders (CTD).  CAUSES  Many RSIs occur due to repeating the same activity at work over weeks or months without sufficient rest, such as prolonged typing. RSIs also commonly occur when a hobby or sport is done repeatedly without sufficient rest. RSIs can also occur due to repeated strain or stress on a body part in someone who has one or more risk factors for RSIs. RISK FACTORS Workplace risk factors  Frequent computer use, especially if your workstation is not adjusted for your body type.  Infrequent rest breaks.  Working in a high-pressure environment.  Working at a [employer].  Repeating the same motion, such as frequent typing.  Working in an awkward position or holding the same position for a long time.  Forceful movements such as lifting, pulling, or pushing.  Vibration caused by using power tools.  Working in cold temperatures.  Job stress. Personal risk factors  Poor posture.  Being loose-jointed.  Not exercising regularly.  Being overweight.  Arthritis, diabetes, thyroid problems, or other long-term  (chronic)medical conditions.  Vitamin deficiencies.  Keeping your fingernails long.  An unhealthy, stressful, or inactive lifestyle.  Not sleeping well. SYMPTOMS  Symptoms often begin at work but become more noticeable after the repeated stress has ended. For example, you may develop fatigue or soreness in your wrist while typingat work, and at night you may develop numbness and tingling in your fingers. Common symptoms include:   Burning, shooting, or aching pain, especially in the fingers, palms, wrists, forearms, or shoulders.  Tenderness.  Swelling.  Tingling, numbness, or loss of feeling.  Pain with certain activities, such as turning a doorknob or reaching above your head.  Weakness, heaviness, or loss of coordination in yourhand.  Muscle spasms or tightness. In some cases, symptoms can become so intense that it is difficult to perform everyday tasks. Symptoms that do not improve with rest may indicate a more serious condition.  DIAGNOSIS  Your caregiver may determine the type ofRSI you have based on your medical evaluation and a description of your activities.  TREATMENT  Treatment depends on the severity and type of RSI you have. Your caregiver may recommend rest for the affected body part, medicines, and physical or occupational therapy to reduce pain, swelling, and soreness. Discuss the activities you do repeatedly with your caregiver. Your caregiver can help you decide whether you need to change your activities. An RSI may take months or years to heal, especially if the affected body part gets insufficient rest. In some cases, such as severe carpal tunnel syndrome, surgery may be recommended. PREVENTION  Talk with your supervisor to make sure you have the proper equipment for your work station.  Maintain good posture at your desk or work station with:  Feet flat on the floor.  Knees directly over the feet, bent at a right angle.  Lower back supported by your  chair or a cushion in the curve of your lower back.  Shoulders and arms relaxed and at your sides.  Neck relaxed and not bent forwards or backwards.  Your desk and computer workstation properly adjusted to your body type.  Your chair adjusted so there is no excess pressure on the back of your thighs.  The keyboard resting above your thighs. You should be able to reach the keys with your elbows at your side, bent at a right angle. Your arms should be supported on forearm rests, with your forearms parallel to the ground.  The computer mouse within easy reach.  The monitor directly in front of you, so that your eyes are aligned with the top of the screen. The screen should be about 15 to 25 inches from your eyes.  While typing, keep your wrist straight, in a neutral position. Move your entire arm when you move your mouse or when typing hard-to-reach keys.  Only use your computer as much as you need to for work. Do not use it during breaks.  Take breaks often from any repeated activity. Alternate with another task which requires you to use different muscles, or rest at least once every hour.  Change positions regularly. If you spend a lot of time sitting, get up, walk around, and stretch.  Do not hold pens or pencils tightly when writing.  Exercise regularly.  Maintain a normal weight.  Eat a diet with plenty of vegetables, whole grains, and fruit.  Get sufficient, restful sleep. HOME CARE INSTRUCTIONS  If your caregiver prescribed medicine to help reduce swelling, take it as directed.  Only take over-the-counter or prescription medicines for pain, discomfort, or fever as directed by your caregiver.  Reduce, and if needed, stopthe activities that are causing your problems until you have no further symptoms.If your symptoms are work-related, you may need to talk to your supervisor about changing your activities.  When symptoms develop, put ice or a cold pack on the aching  area.  Put ice in a plastic bag.  Place a towel between your skin and the bag.  Leave the ice on for 15-20 minutes.  If you were given a splint to keep your wrist from bending, wear it as instructed. It is important to wear the splint at night. Use the splint for as long as your caregiver recommends. SEEK MEDICAL CARE IF:  You develop new problems.  Your problems do not get better with medicine. MAKE SURE YOU:  Understand these instructions.  Will watch your condition.  Will get help right away if you are not doing well or get worse. Document Released: 08/22/2002 Document Revised: 03/02/2012 Document Reviewed: 10/23/2011 [facility] Patient Information 2015 [place], [place]. This information is not intended to replace advice given to you by your health care provider. Make sure you discuss any questions you have with your health care provider.  Tendinitis Tendinitis is swelling and inflammation of the tendons. Tendons are band-like tissues that connect muscle to bone. Tendinitis commonly occurs in the:   Shoulders (rotator cuff).  Heels (Achilles tendon).  Elbows (triceps tendon). CAUSES Tendinitis is usually caused by overusing the tendon, muscles, and joints involved. When the tissue surrounding a tendon (synovium) becomes inflamed, it is called tenosynovitis. Tendinitis commonly develops in people whose jobs require repetitive motions.  SYMPTOMS  Pain.  Tenderness.  Mild swelling. DIAGNOSIS Tendinitis is usually diagnosed by physical exam. Your health care provider may also order X-rays or other imaging tests. TREATMENT Your health care provider may recommend certain medicines or exercises for your treatment. HOME CARE INSTRUCTIONS   Use a sling or splint for as long as directed by your health care provider until the pain decreases.  Put ice on the injured area.  Put ice in a plastic bag.  Place a towel between your skin and the bag.  Leave the ice on for 15-20  minutes, 3-4 times a day, or as directed by your health care provider.  Avoid using the limb while the tendon is painful. Perform gentle range of motion exercises only as directed by your health care provider. Stop exercises if pain or discomfort increase, unless directed otherwise by your health care provider.  Only take over-the-counter or prescription medicines for pain, discomfort, or fever as directed by your health care provider. SEEK MEDICAL CARE IF:   Your pain and swelling increase.  You develop new, unexplained symptoms, especially increased numbness in the hands. MAKE SURE YOU:   Understand these instructions.  Will watch your condition.  Will get help right away if you are not doing well or get worse. Document Released: 08/29/2000 Document Revised: 01/16/2014 Document Reviewed: 11/18/2010 [facility] Patient Information 2015 [place], [place]. This information is not intended to replace advice given to you by your health care provider. Make sure you discuss any questions you have with your health care provider.

## 2014-09-16 NOTE — ED Notes (Signed)
Pt. reports left arm pain onset 3 days ago , denies injury , pain increases when she moves her hands and fingers and certain positions . No fever or chills.

## 2015-01-07 ENCOUNTER — Encounter (HOSPITAL_COMMUNITY): Payer: Self-pay | Admitting: *Deleted

## 2015-01-07 ENCOUNTER — Emergency Department (HOSPITAL_COMMUNITY)
Admission: EM | Admit: 2015-01-07 | Discharge: 2015-01-07 | Disposition: A | Discharge status: Home / Self Care | Payer: Medicaid Other | Attending: Emergency Medicine | Admitting: Emergency Medicine

## 2015-01-07 DIAGNOSIS — K0889 Other specified disorders of teeth and supporting structures: Secondary | ICD-10-CM

## 2015-01-07 DIAGNOSIS — K088 Other specified disorders of teeth and supporting structures: Secondary | ICD-10-CM | POA: Insufficient documentation

## 2015-01-07 DIAGNOSIS — I10 Essential (primary) hypertension: Secondary | ICD-10-CM | POA: Diagnosis not present

## 2015-01-07 DIAGNOSIS — Z3202 Encounter for pregnancy test, result negative: Secondary | ICD-10-CM | POA: Insufficient documentation

## 2015-01-07 DIAGNOSIS — K029 Dental caries, unspecified: Secondary | ICD-10-CM | POA: Insufficient documentation

## 2015-01-07 DIAGNOSIS — Z79899 Other long term (current) drug therapy: Secondary | ICD-10-CM | POA: Insufficient documentation

## 2015-01-07 DIAGNOSIS — Z720 Tobacco use: Secondary | ICD-10-CM | POA: Diagnosis not present

## 2015-01-07 LAB — POC URINE PREG, ED: Preg Test, Ur: NEGATIVE

## 2015-01-07 MED ORDER — IBUPROFEN 800 MG PO TABS: 800.0000 mg | ORAL_TABLET | Freq: Three times a day (TID) | ORAL | Status: DC

## 2015-01-07 MED ORDER — CLINDAMYCIN HCL 150 MG PO CAPS: 450.0000 mg | ORAL_CAPSULE | Freq: Four times a day (QID) | ORAL | Status: DC

## 2015-01-07 MED ORDER — LIDOCAINE VISCOUS 2 % MT SOLN
15.0000 mL | Freq: Once | OROMUCOSAL | Status: AC
  Administered 2015-01-07: 15 mL via OROMUCOSAL
  Filled 2015-01-07: qty 15

## 2015-01-07 MED ORDER — IBUPROFEN 400 MG PO TABS
800.0000 mg | ORAL_TABLET | Freq: Once | ORAL | Status: AC
  Administered 2015-01-07: 800 mg via ORAL
  Filled 2015-01-07: qty 4

## 2015-01-07 NOTE — ED Provider Notes (Signed)
CSN: [id number]     Arrival date & time 01/07/15  1428 History   First MD Initiated Contact with Patient 01/07/15 1448     Chief Complaint  Patient presents with  . Dental Pain  Patient is a 24 y.o. female presenting with tooth pain. The history is provided by the patient. No language interpreter was used.  Dental Pain Associated symptoms: facial swelling   Associated symptoms: no fever    This chart was scribed for non-physician practitioner Ladona Mow, PA-C, working with No att. providers found, by Andrew Au, ED Scribe. This patient was seen in room TR08C/TR08C and the patient's care was started at 6:34 PM.  Stephanie Gutierrez is a 24 y.o. female who presents to the Emergency Department complaining of right upper dental pain. Pt reports dental swelling that began 2 weeks ago followed by pain that began 1 week ago that has gradually worsened over time. She reports facial swelling and swollen lymph node to right side of face. She states she is aware that she is in need of tooth extraction. Patient denies nausea, vomiting, fever, dysphagia, shortness of breath.  Pt is also requesting a pregnancy.   Past Medical History  Diagnosis Date  . Hypertension    History reviewed. No pertinent past surgical history. Family History  Problem Relation Age of Onset  . Hypertension Mother   . CAD Other   . Diabetes Mellitus II Other    History  Substance Use Topics  . Smoking status: Current Every Day Smoker    Types: Cigarettes  . Smokeless tobacco: Not on file  . Alcohol Use: Yes     Comment: rare   OB History    No data available     Review of Systems  Constitutional: Negative for fever.  HENT: Positive for dental problem and facial swelling.   Hematological: Positive for adenopathy.   Allergies  Review of patient's allergies indicates no known allergies.  Home Medications   Prior to Admission medications   Medication Sig Start Date End Date Taking? Authorizing Provider  clindamycin  (CLEOCIN) 150 MG capsule Take 3 capsules (450 mg total) by mouth 4 (four) times daily. 01/07/15   Ladona Mow, PA-C  HYDROcodone-acetaminophen (NORCO/VICODIN) 5-325 MG per tablet Take 1-2 tablets by mouth every 6 (six) hours as needed for moderate pain. 09/16/14   Marisa Severin, MD  ibuprofen (ADVIL,MOTRIN) 800 MG tablet Take 1 tablet (800 mg total) by mouth 3 (three) times daily. 01/07/15   Ladona Mow, PA-C  naproxen (NAPROSYN) 500 MG tablet Take 1 tablet (500 mg total) by mouth 2 (two) times daily with a meal. 09/16/14   Marisa Severin, MD   BP 143/90 mmHg  Pulse 89  Temp(Src) 98.5 F (36.9 C)  Resp 16  Ht  (1.6 m)  Wt 225 lb (102.059 kg)  BMI 39.87 kg/m2  SpO2 96% Physical Exam  Constitutional: She is oriented to person, place, and time. She appears well-developed and well-nourished. No distress.  HENT:  Head: Normocephalic and atraumatic.  Mouth/Throat: Uvula is midline and mucous membranes are normal. No trismus in the jaw. Dental caries present. No dental abscesses or uvula swelling. No oropharyngeal exudate, posterior oropharyngeal edema, posterior oropharyngeal erythema or tonsillar abscesses.  Obvious dental carries to rear upper right posterior molar. Floor of her mouth is soft. No surrounding induration or cellulitis. No gross abscess.  Eyes: Conjunctivae and EOM are normal.  Neck: Neck supple.  Cardiovascular: Normal rate.   Pulmonary/Chest: Effort normal.  Musculoskeletal: Normal  range of motion.  Lymphadenopathy:    She has cervical adenopathy ( mild anterior).  Neurological: She is alert and oriented to person, place, and time.  Skin: Skin is warm and dry.  Psychiatric: She has a normal mood and affect. Her behavior is normal.  Nursing note and vitals reviewed.   ED Course  Procedures (including critical care time) DIAGNOSTIC STUDIES: Oxygen Saturation is 96% on RA, normal by my interpretation.    COORDINATION OF CARE: 6:34 PM- Pt advised of plan for treatment and pt  agrees.  Labs Review Labs Reviewed  POC URINE PREG, ED    Imaging Review No results found.   EKG Interpretation None      MDM   Final diagnoses:  Pain, dental    Patient with toothache.  Patient afebrile, hemodynamically stable, well-appearing and in no acute distress. No gross abscess.  Exam unconcerning for Ludwig's angina or spread of infection.  Will treat with penicillin and pain medicine.  Urged patient to follow-up with dentist.  Discussed return precautions with patient, patient verbalizes understanding and agreement of this plan.   I personally performed the services described in this documentation, which was scribed in my presence. The recorded information has been reviewed and is accurate.  BP 143/90 mmHg  Pulse 89  Temp(Src) 98.5 F (36.9 C)  Resp 16  Ht 5\' 3"  (1.6 m)  Wt 225 lb (102.059 kg)  BMI 39.87 kg/m2  SpO2 96%  Signed,  Ladona MowJoe Caydance Kuehnle, PA-C 6:34 PM    Ladona MowJoe Heavenly Christine, PA-C 01/07/15 1834  Arby BarretteMarcy Pfeiffer, MD 01/13/15 [phone]

## 2015-01-07 NOTE — Discharge Instructions (Signed)
Dental Pain °A tooth ache may be caused by cavities (tooth decay). Cavities expose the nerve of the tooth to air and hot or cold temperatures. It may come from an infection or abscess (also called a boil or furuncle) around your tooth. It is also often caused by dental caries (tooth decay). This causes the pain you are having. °DIAGNOSIS  °Your caregiver can diagnose this problem by exam. °TREATMENT  °· If caused by an infection, it may be treated with medications which kill germs (antibiotics) and pain medications as prescribed by your caregiver. Take medications as directed. °· Only take over-the-counter or prescription medicines for pain, discomfort, or fever as directed by your caregiver. °· Whether the tooth ache today is caused by infection or dental disease, you should see your dentist as soon as possible for further care. °SEEK MEDICAL CARE IF: °The exam and treatment you received today has been provided on an emergency basis only. This is not a substitute for complete medical or dental care. If your problem worsens or new problems (symptoms) appear, and you are unable to meet with your dentist, call or return to this location. °SEEK IMMEDIATE MEDICAL CARE IF:  °· You have a fever. °· You develop redness and swelling of your face, jaw, or neck. °· You are unable to open your mouth. °· You have severe pain uncontrolled by pain medicine. °MAKE SURE YOU:  °· Understand these instructions. °· Will watch your condition. °· Will get help right away if you are not doing well or get worse. °Document Released: 09/01/2005 Document Revised: 11/24/2011 Document Reviewed: 04/19/2008 °ExitCare® Patient Information ©2015 [employer]. This information is not intended to replace advice given to you by your health care provider. Make sure you discuss any questions you have with your health care provider. ° °Emergency Department Resource Guide °1) Find a Doctor and Pay Out of Pocket °Although you won't have to find out who  is covered by your insurance plan, it is a good idea to ask around and get recommendations. You will then need to call the office and see if the doctor you have chosen will accept you as a new patient and what types of options they offer for patients who are self-pay. Some doctors offer discounts or will set up payment plans for their patients who do not have insurance, but you will need to ask so you aren't surprised when you get to your appointment. ° °2) Contact Your Local Health Department °Not all health departments have doctors that can see patients for sick visits, but many do, so it is worth a call to see if yours does. If you don't know where your local health department is, you can check in your phone book. The CDC also has a tool to help you locate your state's health department, and many state websites also have listings of all of their local health departments. ° °3) Find a Walk-in Clinic °If your illness is not likely to be very severe or complicated, you may want to try a walk in clinic. These are popping up all over the country in pharmacies, drugstores, and shopping centers. They're usually staffed by nurse practitioners or physician assistants that have been trained to treat common illnesses and complaints. They're usually fairly quick and inexpensive. However, if you have serious medical issues or chronic medical problems, these are probably not your best option. ° °No Primary Care Doctor: °- Call [facility] at  832-8000 - they can help you locate a primary   care doctor that  accepts your insurance, provides certain services, etc. °- Physician Referral Service- 1-800-533-3463 ° °Chronic Pain Problems: °Organization         Address  Phone   Notes  °[facility]  (336) 297-2271 Patients need to be referred by their primary care doctor.  ° °Medication Assistance: °Organization         Address  Phone   Notes  °[place] [facility] [address]  [address] °[place], [place] [address] (336) 641-8030 --Must be a resident of [place] °-- Must have NO insurance coverage whatsoever (no Medicaid/ Medicare, etc.) °-- The pt. MUST have a primary care doctor that directs their care regularly and follows them in the community °  °[facility]  (866) 331-1348   °[employer]  (888) 892-1162   ° °Agencies that provide inexpensive medical care: °Organization         Address  Phone   Notes  °[facility]  (336) 832-8035   °[facility]    (336) 832-7272   °[facility] [address] °[place], [place] [address] (336) 832-4777   °[facility] [address], °[place] (336) 271-4999   °[facility]    (336) 373-0678   °[facility]    (336) 272-1050   °[facility] ° [address], [place] Phone:  (336) 832-4444, Fax:  (336) 832-4440 Hours of Operation:  9 am - 6 pm, M-F.  Also accepts Medicaid/Medicare and self-pay.  °[facility] ° [address], [place] Phone: (336) 832-3150, Fax: (336) 832-3151. Hours of Operation:  8:30 am - 5:30 pm, M-F.  Also accepts Medicaid and self-pay.  °[facility] [place] [address], [place] Phone: (336) 878-6027   °[facility] [address], [place], [place] (336)723-1848, Ext. 123 Mondays & Thursdays: 7-9 AM.  First 15 patients are seen on a first come, first serve basis. °  ° °Medicaid-accepting [place] Providers: ° °Organization         Address  Phone   Notes  °[facility] [address], [place] (336) 641-2100 Also accepts self-pay patients.  °[facility] [address], [place] ° (336) 856-9996   °[facility] [address], [place] (336) 288-8857   °[facility] [address] [place] [address], [place] (336) 299-7000   °Veita Bland [address], [place]  ° (336) 373-1557 Only accepts [place] Access Medicaid patients after they have their name applied to their card.  ° °Self-Pay (no insurance) in [place]: ° °Organization         Address  Phone   Notes  °Sickle Cell Patients, [facility] [address], [place] (336) 832-1970   °[facility] [address], [place] (336) 832-4400   °[facility] ° [address], [place] (336) 992-4800   °[facility]/Dr. Osei-Bonsu ° [address] [place] [address], [place] or [address], [place] (336) 841-8500 Phone number for both [place] and [place] locations is the same.  °[facility] [address], [place] (336) 299-0000   °[facility] [address] [place] [address],  or [address] (336) 852-7530 °(336) 878-2260   °  [facility] [address], [place] (336) 350-1642, phone; (336) 294-5005, fax Sees patients 1st and 3rd Saturday of every month.  Must not qualify for public or private insurance (i.e. Medicaid, Medicare, [place] Health Choice, Veterans' Benefits) • Household income should be no more than 200% of the poverty level •The clinic cannot treat you if you are pregnant or think you are pregnant • Sexually transmitted diseases are not treated at the clinic.  ° ° °Dental Care: °Organization         Address  Phone  Notes  °[facility] [address], [place] (336) 641-6152 Accepts children up to age 21 who are enrolled in Medicaid or [place] Health Choice; pregnant women with a Medicaid card; and children who have applied for Medicaid or [place] Health Choice, but were declined, whose parents can pay a reduced fee at time of service.  °[facility] [place]  [address], [place] (336) 641-7733 Accepts children up to age 21 who are  enrolled in Medicaid or [place] Health Choice; pregnant women with a Medicaid card; and children who have applied for Medicaid or [place] Health Choice, but were declined, whose parents can pay a reduced fee at time of service.  °[facility] ° [address], [place] (336) 641-4533 Patients are seen by appointment only. Walk-ins are not accepted. [facility] will see patients 18 years of age and older. °Monday - Tuesday (8am-5pm) °Most Wednesdays (8:30-5pm) °$30 per visit, cash only  °[facility] ° [address], [place] (336) 641-4533 Patients are seen by appointment only. Walk-ins are not accepted. [facility] will see patients 18 years of age and older. °One Wednesday Evening (Monthly: Volunteer Based).  $30 per visit, cash only  °[facility]  (919) 537-3737 for adults; Children under age 4, call Graduate Pediatric Dentistry at ([phone]) [phone]. Children aged 4-14, please call ([phone]) [phone] to request a pediatric application. ° Dental services are provided in all areas of dental care including fillings, crowns and bridges, complete and partial dentures, implants, gum treatment, root canals, and extractions. Preventive care is also provided. Treatment is provided to both adults and children. °Patients are selected via a lottery and there is often a waiting list. °  °[facility] [address], °[place] ° (336) 763-8833 [email or web address] °  °[facility] [address], [place], [place] (336)723-1848, Ext. 123 Second and Fourth Thursday of each month, opens at 6:30 AM; Clinic ends at 9 AM.  Patients are seen on a first-come first-served basis, and a limited number are seen during each clinic.  ° °[facility] ° [address], [place], [place] (336) 723-7904   Eligibility Requirements °You must have lived in [place], [place], or [place] counties for at least the last three months. °  You  cannot be eligible for state or federal sponsored healthcare insurance, including Veterans Administration, Medicaid, or Medicare. °  You generally cannot be eligible for healthcare insurance through your employer.  °  How to apply: °Eligibility screenings are held every Tuesday and Wednesday afternoon from 1:00 pm until 4:00 pm. You do not need an appointment for the interview!  °[facility] [address], [place], [place] 336-631-2330   °[facility]  336-342-8273   °[facility]  336-703-3100   °[facility]  336-570-6415   ° °Behavioral Health Resources in the Community: °Intensive Outpatient Programs °Organization         Address  Phone  Notes  °[facility] [address], [place], [place] 336-878-6098   °[facility] [address], [place], [place] 336-832-9800   °[facility] [address], [place], [place] ° 336-882-2125   °[facility] [address],  °[place], [place] 1-800-853-5163 or 336-641-4981   °Substance Abuse Resources °Organization         Address  Phone  Notes  °[facility]  336-882-2125   °[facility]  336-784-9470   °[facility]  336-285-9073   °[facility]  336-845-3988   °Residential & Outpatient Substance Abuse Program  1-800-659-3381   °Psychological Services °Organization         Address  Phone  Notes  °[facility]  336- 832-9600   °[facility]  336- 378-7881   °[facility] [address], [place] 1-800-853-5163 or 336-641-4981   ° °Mobile Crisis Teams °Organization         Address  Phone  Notes  °[facility], Mobile Crisis Care Unit  1-877-626-1772   °Assertive °[facility] ° [address] [place], [place] 336-834-9664   °Sharon DeEsch [address] °[place] 336-554-5454   ° °Self-Help/Support  Groups °Organization         Address  Phone             Notes  °[facility] - variety of support groups  336- 373-1402 Call for more information  °Narcotics Anonymous (NA), [facility] [address], °[place]  2 meetings at this location  ° °Residential Treatment Programs °Organization         Address  Phone  Notes  °[facility] [address],    °[place]  1-866-801-8205   °[facility] ° [address], [place], [place] 704-293-8524   °[facility] [address], [place] 336-845-3988 Admissions: 8am-3pm M-F  °[facility] [address]    °[place], [place] 336-841-1104   °[facility] [address], [place], [place] 336-379-7146   °[facility] [address]  °[place], [place] 336-285-9073   °[facility] - Intensive Outpatient [address], [place], [place] 336-852-3033   °[facility] [address]  °[place], [place] 1-877-615-2722 or 336-784-9470   °[facility] [address] [place], [place] 336-227-7417 Accepts Medicaid  °[facility] [address]  °[place] 1-800-659-3381 Substance Abuse/Addiction Treatment  ° °[place] Behavioral Health Resources °Organization         Address  Phone  Notes  °[facility]  (888) 581-9988   °Julie Brannon, PhD [address] [place], [place]   (336) 349-5553 or (336) 951-0000   °[facility]   [address] °[place], [place] (336) 349-4454   °[facility] [address], [place], [place] (336) 342-8316 Insurance/Medicaid/sponsorship through [facility]  °[facility] [address]                                    [place], [place] (336) 342-8316 Therapy/tele-psych/case  °[facility]   [address]  ° [place], [place] (336) 349-2233    °Dr. Arfeen  (336) 349-4544   °[facility] [place]  [employer] [place] [facility] 1) [address], [place] °2) [address], [place] °3)  [address], [place] (336) 349-3220 °(336) 342-7768 ° °(336) 342-8140   °[place] [facility] (336) 342-1394 or (336) 342-3537 (After Hours)    ° ° ° °

## 2015-01-07 NOTE — ED Notes (Signed)
Pt reports right side upper dental pain for extended amount of time, now has increase in pain to mouth and face. Airway intact.

## 2015-01-07 NOTE — ED Notes (Signed)
Declined W/C at D/C and was escorted to lobby by RN. 

## 2015-03-22 ENCOUNTER — Emergency Department (HOSPITAL_COMMUNITY): Payer: Medicaid Other

## 2015-03-22 ENCOUNTER — Emergency Department (HOSPITAL_COMMUNITY)
Admission: EM | Admit: 2015-03-22 | Discharge: 2015-03-22 | Disposition: A | Discharge status: Home / Self Care | Payer: Medicaid Other | Attending: Emergency Medicine | Admitting: Emergency Medicine

## 2015-03-22 ENCOUNTER — Encounter (HOSPITAL_COMMUNITY): Payer: Self-pay | Admitting: Emergency Medicine

## 2015-03-22 DIAGNOSIS — R112 Nausea with vomiting, unspecified: Secondary | ICD-10-CM | POA: Diagnosis not present

## 2015-03-22 DIAGNOSIS — M795 Residual foreign body in soft tissue: Secondary | ICD-10-CM

## 2015-03-22 DIAGNOSIS — Y9389 Activity, other specified: Secondary | ICD-10-CM | POA: Insufficient documentation

## 2015-03-22 DIAGNOSIS — Z720 Tobacco use: Secondary | ICD-10-CM | POA: Diagnosis not present

## 2015-03-22 DIAGNOSIS — Y998 Other external cause status: Secondary | ICD-10-CM | POA: Insufficient documentation

## 2015-03-22 DIAGNOSIS — Y9289 Other specified places as the place of occurrence of the external cause: Secondary | ICD-10-CM | POA: Insufficient documentation

## 2015-03-22 DIAGNOSIS — S0180XA Unspecified open wound of other part of head, initial encounter: Secondary | ICD-10-CM | POA: Insufficient documentation

## 2015-03-22 DIAGNOSIS — R51 Headache: Secondary | ICD-10-CM | POA: Diagnosis present

## 2015-03-22 DIAGNOSIS — W34010A Accidental discharge of airgun, initial encounter: Secondary | ICD-10-CM | POA: Insufficient documentation

## 2015-03-22 DIAGNOSIS — Z792 Long term (current) use of antibiotics: Secondary | ICD-10-CM | POA: Diagnosis not present

## 2015-03-22 DIAGNOSIS — G43909 Migraine, unspecified, not intractable, without status migrainosus: Secondary | ICD-10-CM | POA: Diagnosis not present

## 2015-03-22 DIAGNOSIS — G43009 Migraine without aura, not intractable, without status migrainosus: Secondary | ICD-10-CM

## 2015-03-22 DIAGNOSIS — I10 Essential (primary) hypertension: Secondary | ICD-10-CM | POA: Insufficient documentation

## 2015-03-22 DIAGNOSIS — R519 Headache, unspecified: Secondary | ICD-10-CM

## 2015-03-22 MED ORDER — METOCLOPRAMIDE HCL 5 MG/ML IJ SOLN
10.0000 mg | Freq: Once | INTRAMUSCULAR | Status: DC
  Filled 2015-03-22: qty 2

## 2015-03-22 MED ORDER — METOCLOPRAMIDE HCL 5 MG/ML IJ SOLN: 10.0000 mg | Freq: Once | INTRAMUSCULAR | Status: DC

## 2015-03-22 MED ORDER — IBUPROFEN 800 MG PO TABS
800.0000 mg | ORAL_TABLET | Freq: Once | ORAL | Status: AC
  Administered 2015-03-22: 800 mg via ORAL
  Filled 2015-03-22: qty 1

## 2015-03-22 MED ORDER — SODIUM CHLORIDE 0.9 % IV BOLUS (SEPSIS): 1000.0000 mL | Freq: Once | INTRAVENOUS | Status: DC

## 2015-03-22 MED ORDER — KETOROLAC TROMETHAMINE 30 MG/ML IJ SOLN
30.0000 mg | Freq: Once | INTRAMUSCULAR | Status: DC
  Filled 2015-03-22: qty 1

## 2015-03-22 MED ORDER — DIPHENHYDRAMINE HCL 25 MG PO CAPS
25.0000 mg | ORAL_CAPSULE | Freq: Once | ORAL | Status: AC
Start: 2015-03-22 — End: 2015-03-22
  Administered 2015-03-22: 25 mg via ORAL
  Filled 2015-03-22: qty 1

## 2015-03-22 MED ORDER — DIPHENHYDRAMINE HCL 50 MG/ML IJ SOLN
25.0000 mg | Freq: Once | INTRAMUSCULAR | Status: DC
  Filled 2015-03-22: qty 1

## 2015-03-22 MED ORDER — BUTALBITAL-APAP-CAFFEINE 50-325-40 MG PO TABS: 1.0000 | ORAL_TABLET | Freq: Four times a day (QID) | ORAL | Status: AC | PRN

## 2015-03-22 NOTE — ED Notes (Signed)
Downtime patient unable to sign discharge.
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
## 2015-07-02 IMAGING — CR DG CHEST 2V
2 series · 2 of 2 positions shown · non-contrast
Comparison: None.

CLINICAL DATA: Chest pain

EXAM:
CHEST  2 VIEW

[w chest pa]
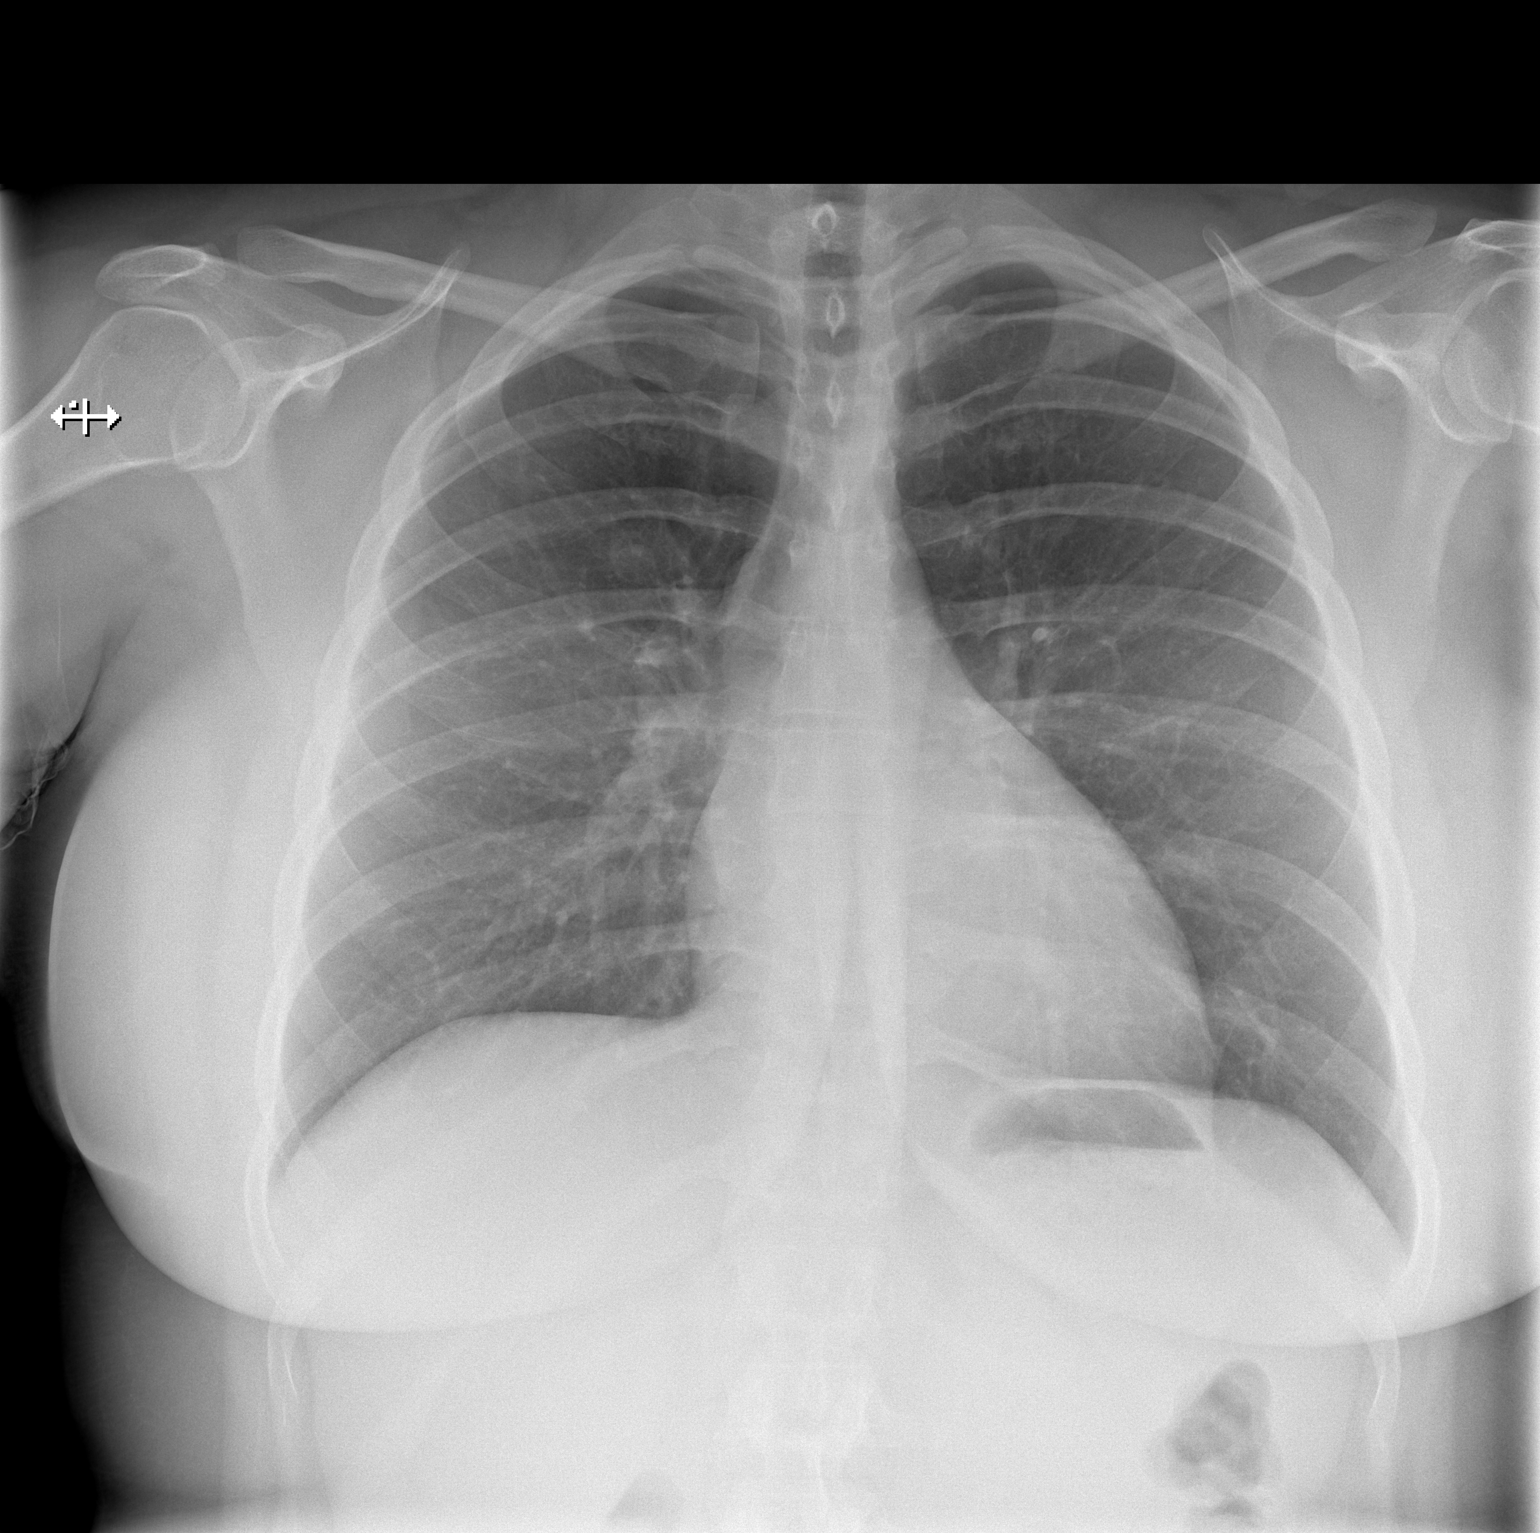

[w chest lat]
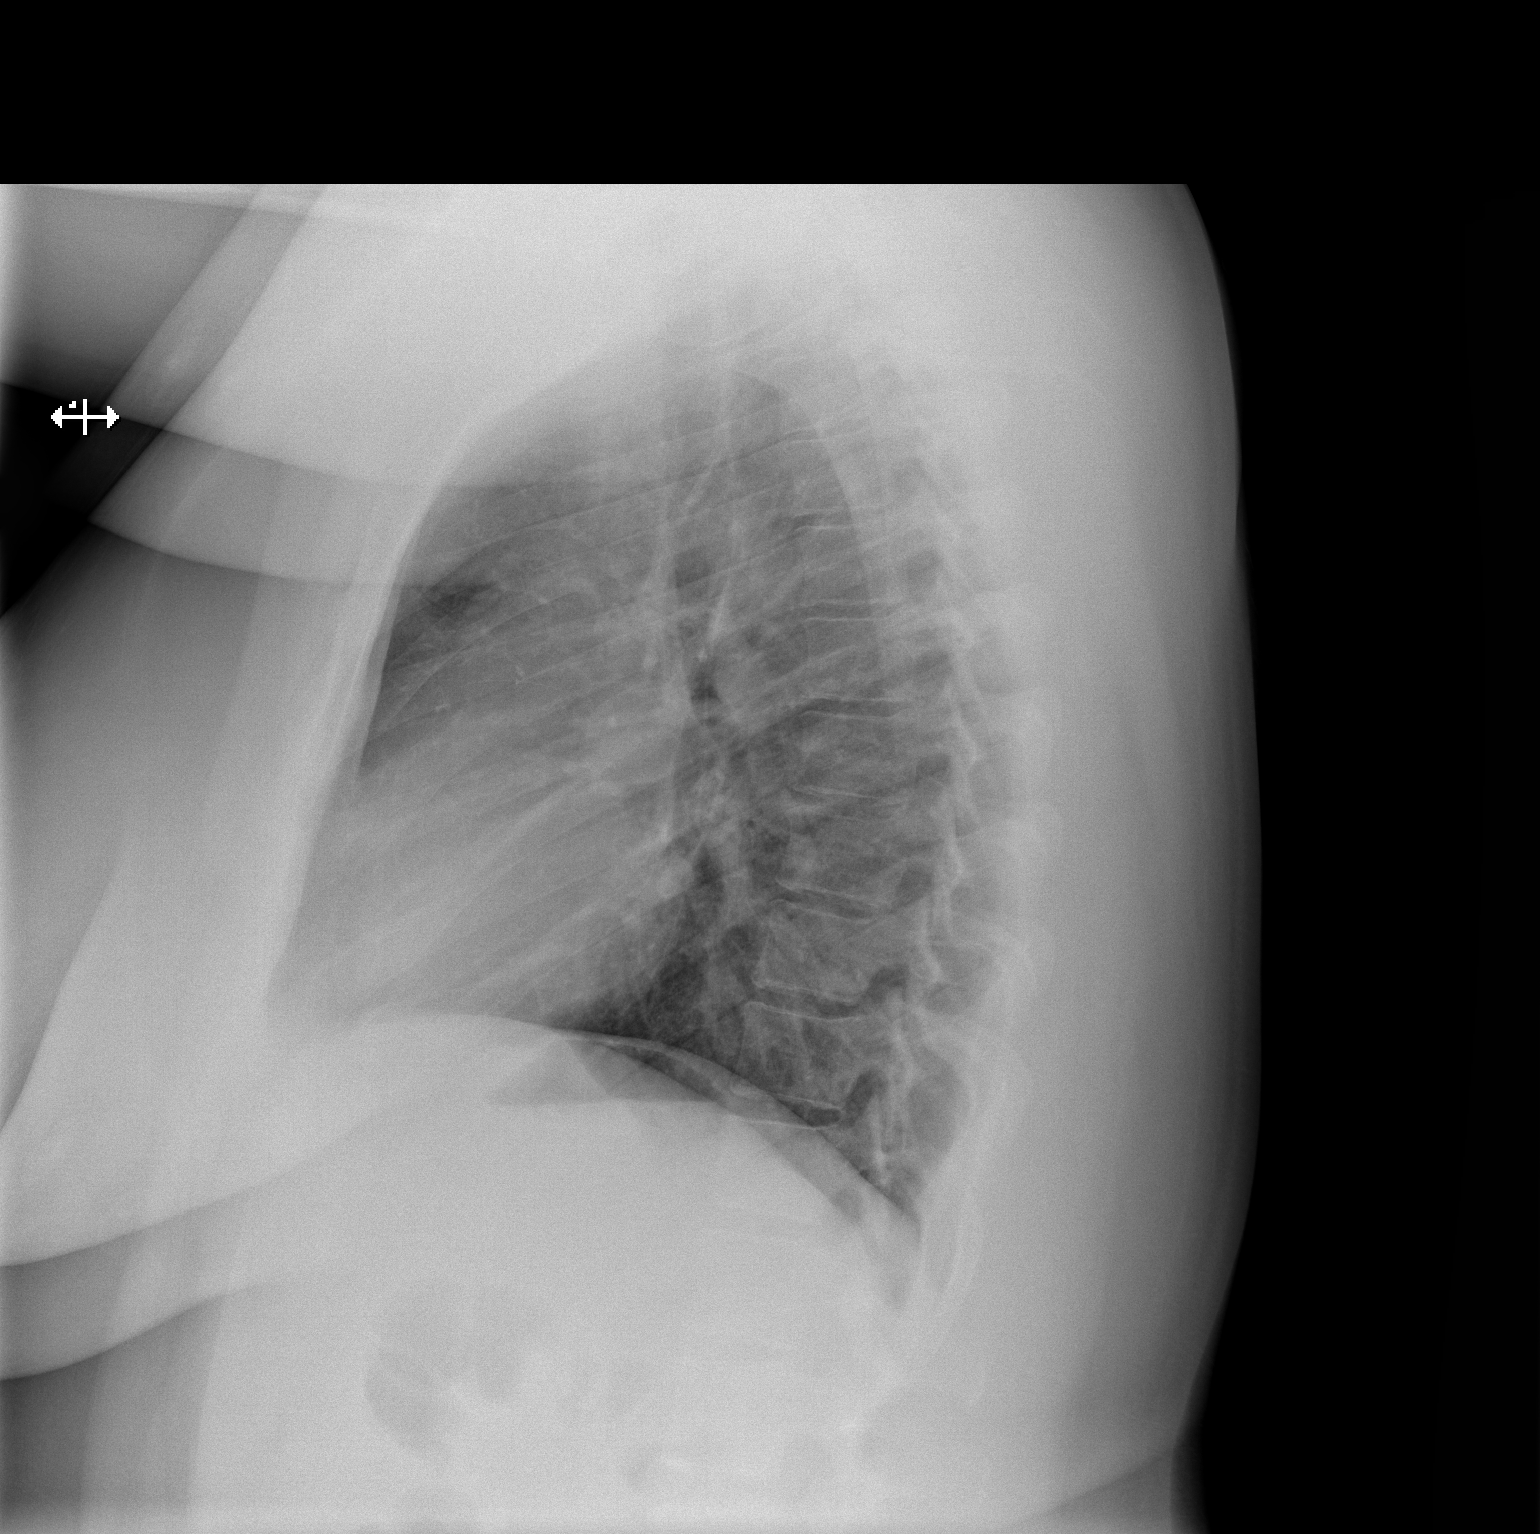

[2 of 2 positions shown; findings below may reference images not displayed]

FINDINGS: The heart size and mediastinal contours are within normal limits.
Both lungs are clear. The visualized skeletal structures are
unremarkable.
IMPRESSION: No active cardiopulmonary disease.

## 2015-10-13 ENCOUNTER — Emergency Department (HOSPITAL_COMMUNITY)
Admission: EM | Admit: 2015-10-13 | Discharge: 2015-10-14 | Disposition: A | Discharge status: Home / Self Care | Payer: Medicaid Other | Attending: Emergency Medicine | Admitting: Emergency Medicine

## 2015-10-13 ENCOUNTER — Encounter (HOSPITAL_COMMUNITY): Payer: Self-pay | Admitting: *Deleted

## 2015-10-13 DIAGNOSIS — M5441 Lumbago with sciatica, right side: Secondary | ICD-10-CM

## 2015-10-13 DIAGNOSIS — F17210 Nicotine dependence, cigarettes, uncomplicated: Secondary | ICD-10-CM | POA: Insufficient documentation

## 2015-10-13 DIAGNOSIS — I10 Essential (primary) hypertension: Secondary | ICD-10-CM | POA: Insufficient documentation

## 2015-10-13 DIAGNOSIS — M545 Low back pain: Secondary | ICD-10-CM | POA: Diagnosis present

## 2015-10-13 MED ORDER — OXYCODONE-ACETAMINOPHEN 5-325 MG PO TABS
1.0000 | ORAL_TABLET | Freq: Once | ORAL | Status: AC
  Administered 2015-10-14: 1 via ORAL
  Filled 2015-10-13: qty 1

## 2015-10-13 MED ORDER — METHOCARBAMOL 500 MG PO TABS
1000.0000 mg | ORAL_TABLET | Freq: Once | ORAL | Status: AC
  Administered 2015-10-14: 1000 mg via ORAL
  Filled 2015-10-13: qty 2

## 2015-10-13 MED ORDER — KETOROLAC TROMETHAMINE 60 MG/2ML IM SOLN
60.0000 mg | Freq: Once | INTRAMUSCULAR | Status: DC
  Filled 2015-10-13: qty 2

## 2015-10-13 MED ORDER — PREDNISONE 20 MG PO TABS
60.0000 mg | ORAL_TABLET | Freq: Once | ORAL | Status: AC
  Administered 2015-10-14: 60 mg via ORAL
  Filled 2015-10-13: qty 3

## 2015-10-13 NOTE — ED Provider Notes (Signed)
CSN: [id number]     Arrival date & time 10/13/15  2251 History  By signing my name below, I, Stephanie Gutierrez, attest that this documentation has been prepared under the direction and in the presence of Penda Venturi, PA-C. Electronically Signed: Doreatha Gutierrez, ED Scribe. 10/13/2015. 11:51 PM.    Chief Complaint  Patient presents with  . Back Pain    The history is provided by the patient. No language interpreter was used.    HPI Comments: Stephanie Gutierrez is a 25 y.o. female with h/o HTN who presents to the Emergency Department complaining of moderate, worsening, sharp, aching, pulling 10/10 right-sided lower back pain onset 2 days ago. Pt denies recent trauma or falls.  Pt states she was lifting heavy boxes without the help of equipment at work hurriedly before the onset of her symptoms. She reports she has tried OTC pain relievers with no relief. Pt reports that her pain is worsened with movement, twisting and ambulation.  Pt denies fever, chills, nausea, emesis, dysuria, difficulty urinating, numbness, focal weakness, paresthesia, bowel or bladder incontinence.    Past Medical History  Diagnosis Date  . Hypertension    History reviewed. No pertinent past surgical history. Family History  Problem Relation Age of Onset  . Hypertension Mother   . CAD Other   . Diabetes Mellitus II Other    Social History  Substance Use Topics  . Smoking status: Current Every Day Smoker    Types: Cigarettes  . Smokeless tobacco: None  . Alcohol Use: Yes     Comment: rare   OB History    No data available     Review of Systems  Constitutional: Negative for fever and chills.  Gastrointestinal: Negative for nausea and vomiting.  Genitourinary: Negative for dysuria and difficulty urinating.  Musculoskeletal: Positive for back pain.  Neurological: Negative for weakness and numbness.   Allergies  Review of patient's allergies indicates no known allergies.  Home Medications   Prior to Admission medications    Medication Sig Start Date End Date Taking? Authorizing Provider  butalbital-acetaminophen-caffeine (FIORICET) 50-325-40 MG per tablet Take 1-2 tablets by mouth every 6 (six) hours as needed for headache. 03/22/15 03/21/16  Marisa Severin, MD  ibuprofen (ADVIL,MOTRIN) 800 MG tablet Take 1 tablet (800 mg total) by mouth 3 (three) times daily. 10/14/15   Kendrix Orman C Hilmer Aliberti, PA-C  lidocaine (LIDODERM) 5 % Place 1 patch onto the skin daily. Remove & Discard patch within 12 hours or as directed by MD 10/14/15   Anselm Pancoast, PA-C  methocarbamol (ROBAXIN) 500 MG tablet Take 1 tablet (500 mg total) by mouth 2 (two) times daily. 10/14/15   Jaciel Diem C Allesha Aronoff, PA-C  oxyCODONE-acetaminophen (PERCOCET/ROXICET) 5-325 MG tablet Take 1-2 tablets by mouth every 4 (four) hours as needed for severe pain. 10/14/15   Yussef Jorge C Cecile Guevara, PA-C  predniSONE (DELTASONE) 20 MG tablet Take 2 tablets (40 mg total) by mouth daily with breakfast. 10/14/15   Burdell Peed C Karlina Suares, PA-C   BP 138/94 mmHg  Pulse 101  Temp(Src) 98.8 F (37.1 C) (Oral)  Resp 18  Wt 107.82 kg  SpO2 95% Physical Exam  Constitutional: She is oriented to person, place, and time. She appears well-developed and well-nourished.  HENT:  Head: Normocephalic and atraumatic.  Neck: Normal range of motion. Neck supple.  Cardiovascular: Normal rate and intact distal pulses.   Pulmonary/Chest: Effort normal. No respiratory distress.  Musculoskeletal: Normal range of motion. She exhibits tenderness.  Full ROM in all extremities and  spine. No paraspinal tenderness. Tenderness to the right lumbar musculature.   Neurological: She is alert and oriented to person, place, and time. She has normal reflexes.  No sensory deficits. Strength 5/5 in all extremities. No gait disturbance. Coordination intact.   Skin: Skin is warm and dry.  Psychiatric: She has a normal mood and affect. Her behavior is normal.  Nursing note and vitals reviewed.  ED Course  Procedures (including critical care time) DIAGNOSTIC  STUDIES: Oxygen Saturation is 95% on RA, adequate by my interpretation.    COORDINATION OF CARE: 11:49 PM Discussed treatment plan with pt at bedside which includes pain management and pt agreed to plan.   Imaging Review No results found. I have personally reviewed and evaluated these images as part of my medical decision-making.  MDM   Final diagnoses:  Right-sided low back pain with right-sided sciatica    [name] presents to the ED for evaluation of right lower back pain. No neurological deficits and has a normal neuro exam.  Patient is ambulatory.  No loss of bowel or bladder control.  No concern for cauda equina. No red flags. No fever, night sweats, weight loss, h/o cancer, IVDA, no recent procedure to back. No urinary symptoms suggestive of UTI. Patient was offered IM Toradol, but declined. Patient improved with conservative management here in the ED. Conservative home therapies recommended and discussed, including robaxin, prednisone, q8h 800 mg ibuprofen. Supportive care and return precaution discussed. Appears safe for discharge at this time. Follow up as indicated in discharge paperwork.  Pt is agreeable to plan.   I personally performed the services described in this documentation, which was scribed in my presence. The recorded information has been reviewed and is accurate.    Anselm Pancoast, PA-C 10/14/15 4098  Shon Baton, MD 10/15/15 [phone]

## 2015-10-13 NOTE — ED Notes (Signed)
The pt is c/o lower back pain for 3 days after  Lifting heavy boxes at work.  lmp irregular

## 2015-10-14 MED ORDER — PREDNISONE 20 MG PO TABS: 40.0000 mg | ORAL_TABLET | Freq: Every day | ORAL | Status: DC

## 2015-10-14 MED ORDER — METHOCARBAMOL 500 MG PO TABS: 500.0000 mg | ORAL_TABLET | Freq: Two times a day (BID) | ORAL | Status: DC

## 2015-10-14 MED ORDER — LIDOCAINE 5 % EX PTCH: 1.0000 | MEDICATED_PATCH | CUTANEOUS | Status: DC

## 2015-10-14 MED ORDER — OXYCODONE-ACETAMINOPHEN 5-325 MG PO TABS: 1.0000 | ORAL_TABLET | ORAL | Status: DC | PRN

## 2015-10-14 MED ORDER — IBUPROFEN 800 MG PO TABS: 800.0000 mg | ORAL_TABLET | Freq: Three times a day (TID) | ORAL | Status: DC

## 2015-10-14 NOTE — Discharge Instructions (Signed)
You have been seen today for back pain. Follow up with PCP as needed for chronic management of this issue. Return to ED should symptoms worsen. ° ° °Emergency Department Resource Guide °1) Find a Doctor and Pay Out of Pocket °Although you won't have to find out who is covered by your insurance plan, it is a good idea to ask around and get recommendations. You will then need to call the office and see if the doctor you have chosen will accept you as a new patient and what types of options they offer for patients who are self-pay. Some doctors offer discounts or will set up payment plans for their patients who do not have insurance, but you will need to ask so you aren't surprised when you get to your appointment. ° °2) Contact Your Local Health Department °Not all health departments have doctors that can see patients for sick visits, but many do, so it is worth a call to see if yours does. If you don't know where your local health department is, you can check in your phone book. The CDC also has a tool to help you locate your state's health department, and many state websites also have listings of all of their local health departments. ° °3) Find a Walk-in Clinic °If your illness is not likely to be very severe or complicated, you may want to try a walk in clinic. These are popping up all over the country in pharmacies, drugstores, and shopping centers. They're usually staffed by nurse practitioners or physician assistants that have been trained to treat common illnesses and complaints. They're usually fairly quick and inexpensive. However, if you have serious medical issues or chronic medical problems, these are probably not your best option. ° °No Primary Care Doctor: °- Call Health Connect at  [phone] - they can help you locate a primary care doctor that  accepts your insurance, provides certain services, etc. °- Physician Referral Service- 1-800-533-3463 ° °Chronic Pain Problems: °Organization          Address  Phone   Notes  °[facility]  ([phone]) [phone] Patients need to be referred by their primary care doctor.  ° °Medication Assistance: °Organization         Address  Phone   Notes  °[place] [facility] [address] °[place], [place] [address] (336) 641-8030 --Must be a resident of [place] °-- Must have NO insurance coverage whatsoever (no Medicaid/ Medicare, etc.) °-- The pt. MUST have a primary care doctor that directs their care regularly and follows them in the community °  °[facility]  (866) 331-1348   °[employer]  (888) 892-1162   ° °Agencies that provide inexpensive medical care: °Organization         Address  Phone   Notes  °[facility]  ([phone]) [phone]   °[facility]    (336) 832-7272   °[facility] [address] °[place], [place] [address] ([phone]) [phone]   °[facility] [address], °[place] (336) [phone]   °Planned Parenthood    (336) 373-0678   °[facility]    (336) 272-1050   °[facility] ° [address], [place] Phone:  (336) [phone], Fax:  ([phone] Hours of Operation:  9 am - 6 pm, M-F.  Also accepts Medicaid/Medicare and self-pay.  °[facility] ° [address],   [place] Phone: ([phone], Fax: [phone]. Hours of Operation:  8:30 am - 5:30 pm, M-F.  Also accepts Medicaid and self-pay.  [facility] [address], [place] Phone: [phone]   [place], [place], [place] [phone], Ext. [phone] Mondays & Thursdays: 7-9 AM.  First 15 patients are seen on a first come, first serve basis.    [place] Providers:  Organization         Address  Phone   Notes  [facility] [address], [place] [phone] Also accepts self-pay patients.  [facility] [address], [place]  [phone]   [place], [address], [place] [phone]   [facility] [address], [place] [phone]   Lucianne Lei [address], [place]   [phone] Only accepts [place] Access [place] patients after they have their name applied to their card.   Self-Pay (no insurance) in [facility]:  Organization         Address  Phone   Notes  Sickle Cell Patients, [facility] [place] [phone]   [facility] [place] [phone]   [facility] [place]  [place], [place], [place] [phone]   [facility]/Dr. Osei-Bonsu  [address], [place] or [address], [place] [phone] Phone number for both [place] and [place] locations is the same.  [facility] [address], [place] [phone]   [facility] [address] [place] or [address] [phone]   [facility] [address], [place] ([phone], phone; [phone], fax Sees patients 1st and 3rd Saturday of every month.  Must not qualify for public or private insurance (i.e. Medicaid, Medicare, [place] Health Choice, Veterans' Benefits)  Household income should be no more than 200% of the poverty level The clinic cannot treat you if you are pregnant or think you are pregnant  Sexually transmitted diseases are not treated at the clinic.    Dental Care: Organization         Address  Phone  Notes  [facility] Department of [facility] [phone] Accepts children up to age 47 who are enrolled in [place] or [place]; pregnant women with a Medicaid card; and  children who have applied for Medicaid or [place] Health Choice, but were declined, whose parents can pay a reduced fee at time of service.  [facility] Department of Operating Room Services  [address], [place] [phone] Accepts children up to age 20 who are enrolled in [place] or [place]; pregnant women with a Medicaid card; and children who have applied for Medicaid or [place] Health Choice, but were declined, whose parents can pay a reduced fee at time of service.  [facility]  [place] ([phone] Patients are seen by appointment only. Walk-ins are not accepted. [place] will see patients 24 years of age and older. Monday - Tuesday (8am-5pm) Most Wednesdays (8:30-5pm) $30 per visit, cash only  [place]  [place]  [address], [facility] [phone] Patients are seen by appointment only. Walk-ins are not accepted. [place] will see patients 60 years of age and older. One Wednesday Evening (Monthly: Volunteer Based).  $30 per visit, cash only  [place]  [phone] for adults; Children under age 70, call Graduate Pediatric Dentistry at [phone]. Children aged 74-14, please call [phone] to request a pediatric application.  Dental services are provided in all areas of dental care including fillings, crowns and bridges, complete and partial dentures, implants, gum treatment, root canals, and extractions. Preventive care is also provided. Treatment is provided to both adults and children. Patients are selected via a lottery and there is often a waiting list.   [facility] [address] [place]  [phone] [email or web address]   [facility] [address] [place], [place] [phone], Ext. [phone] Second and Fourth Thursday of each month, opens at 6:30 AM; Clinic ends at 9 AM.  Patients are seen on a first-come first-served  basis, and a limited number are seen during each clinic.   [facility]  [address] Hillard Danker [place], [place] [phone]   Eligibility Requirements You must have lived in [place], [place], or [place] counties for at least the last three months.   You cannot be eligible for state or federal sponsored [employer], including [employer], [place], or [employer].   You generally cannot be eligible for healthcare insurance through your employer.    How to apply: Eligibility screenings are held every Tuesday and Wednesday afternoon from 1:00 pm until 4:00 pm. You do not need an appointment for the interview!  [facility] [address] [place], [place]   [place]  [place] Department  [place]  [phone]    Behavioral Health Resources in the Community: Intensive Outpatient Programs Organization         Address  Phone  Notes  Floris Vancleave. [address] [place], [place] [phone]   [facility] Outpatient [address] [place], [place]   ADS: Alcohol [facility] Drug S[facility] [address] [place], [place]   [place] [address],  [place], [place] or [phone]   Substance Abuse Resources Organization         Address  Phone  Notes  Alcohol and Drug Services  219-182-1857   [place]  [phone]   [facility] [place]   Chinita Pester  [phone]   Residential & Outpatient Substance Abuse Program  [phone]   Psychological Services Organization         Address  Phone  Notes  [facility] [place]  [place]  [phone]   [place] [address], [place] or [phone]    Mobile Crisis Teams Organization          Address  Phone  Notes  [facility], Mobile Crisis Care Unit  [phone]   [facility]  [address] [place], [place]   [place] [address], [place] [phone]    Self-Help/Support Groups Organization         Address  Phone             Notes  [place]. of [place] - variety  of support groups  336- (772)104-6752 Call for more information  Narcotics Anonymous (NA), [facility] [address], [employer] [place]  2 meetings at this location   Residential Facilities manager         Address  Phone  Notes  [facility] [place],    [place]  1-(518) [phone]   [facility]  [address], [place] [address], [place], [place]   [place], [place] [phone] Admissions: 8am-3pm M-F  Incentives Substance [place] [address]    [place], [place] [phone]   [facility] [address] [place], [place], [place]   [facility] [address],  [place], [place]   [facility] - Intensive Outpatient [address] Kristeen [address], [place], [place]   [facility] ([place].,  [place], [place] [phone] or [phone]   [facility] ([facility]) [address] [place], [place] Accepts Medicaid  [facility] [place] [address],  [place] [phone])[phone] Substance Abuse/Addiction Treatment   [facility] Organization         Address  Phone  Notes  [facility]  ([phone]   Domenic Schwab, PhD [address] Arlis Porta [place], [place]   [phone] or [phone]   [place], [place] [phone]   [facility] [address], [place], [place] [phone] Insurance/Medicaid/sponsorship  through [facility] [place] and Families [address] [place]                                    [place], [place] [phone] [place] [address], [place] [phone]    Dr. Adele Schilder  [phone]   [facility] Dept. 1) [address], [place] 2) [place] 3)  [place] [address], [place] [phone]  [phone]   [place] [phone] or [phone] (After Hours)

## 2015-10-14 NOTE — ED Notes (Signed)
Pt left at this time with all belongings.  

## 2016-03-19 ENCOUNTER — Emergency Department (HOSPITAL_COMMUNITY)
Admission: EM | Admit: 2016-03-19 | Discharge: 2016-03-20 | Disposition: A | Discharge status: Home / Self Care | Payer: Medicaid Other | Attending: Emergency Medicine | Admitting: Emergency Medicine

## 2016-03-19 ENCOUNTER — Encounter (HOSPITAL_COMMUNITY): Payer: Self-pay | Admitting: Emergency Medicine

## 2016-03-19 DIAGNOSIS — Z79899 Other long term (current) drug therapy: Secondary | ICD-10-CM | POA: Diagnosis not present

## 2016-03-19 DIAGNOSIS — F17210 Nicotine dependence, cigarettes, uncomplicated: Secondary | ICD-10-CM | POA: Insufficient documentation

## 2016-03-19 DIAGNOSIS — I10 Essential (primary) hypertension: Secondary | ICD-10-CM | POA: Insufficient documentation

## 2016-03-19 DIAGNOSIS — R1030 Lower abdominal pain, unspecified: Secondary | ICD-10-CM | POA: Diagnosis present

## 2016-03-19 DIAGNOSIS — N938 Other specified abnormal uterine and vaginal bleeding: Secondary | ICD-10-CM | POA: Diagnosis not present

## 2016-03-19 DIAGNOSIS — R102 Pelvic and perineal pain: Secondary | ICD-10-CM | POA: Insufficient documentation

## 2016-03-19 DIAGNOSIS — N939 Abnormal uterine and vaginal bleeding, unspecified: Secondary | ICD-10-CM

## 2016-03-19 LAB — COMPREHENSIVE METABOLIC PANEL
ALK PHOS: 70 U/L (ref 38–126)
ALT: 24 U/L (ref 14–54)
ANION GAP: 7 (ref 5–15)
AST: 21 U/L (ref 15–41)
Albumin: 3.8 g/dL (ref 3.5–5.0)
BILIRUBIN TOTAL: 0.4 mg/dL (ref 0.3–1.2)
BUN: 9 mg/dL (ref 6–20)
CO2: 23 mmol/L (ref 22–32)
Calcium: 9.1 mg/dL (ref 8.9–10.3)
Chloride: 106 mmol/L (ref 101–111)
Creatinine, Ser: 0.86 mg/dL (ref 0.44–1.00)
GFR calc Af Amer: >60 mL/min (ref >60)
GFR calc non Af Amer: >60 mL/min (ref >60)
Glucose, Bld: 135 mg/dL — ABNORMAL HIGH (ref 65–99)
Potassium: 3.8 mmol/L (ref 3.5–5.1)
Sodium: 136 mmol/L (ref 135–145)
TOTAL PROTEIN: 7.1 g/dL (ref 6.5–8.1)

## 2016-03-19 LAB — URINALYSIS, ROUTINE W REFLEX MICROSCOPIC
BILIRUBIN URINE: NEGATIVE
Glucose, UA: NEGATIVE mg/dL
KETONES UR: NEGATIVE mg/dL
NITRITE: NEGATIVE
PROTEIN: NEGATIVE mg/dL
Specific Gravity, Urine: 1.023 (ref 1.005–1.030)
pH: 5 (ref 5.0–8.0)

## 2016-03-19 LAB — URINE MICROSCOPIC-ADD ON

## 2016-03-19 LAB — CBC
HCT: 38.5 % (ref 36.0–46.0)
HEMOGLOBIN: 12.9 g/dL (ref 12.0–15.0)
MCH: 28 pg (ref 26.0–34.0)
MCHC: 33.5 g/dL (ref 30.0–36.0)
MCV: 83.5 fL (ref 78.0–100.0)
PLATELETS: 217 10*3/uL (ref 150–400)
RBC: 4.61 MIL/uL (ref 3.87–5.11)
RDW: 12.1 % (ref 11.5–15.5)
WBC: 6.5 10*3/uL (ref 4.0–10.5)

## 2016-03-19 LAB — LIPASE, BLOOD: Lipase: 19 U/L (ref 11–51)

## 2016-03-19 MED ORDER — HYDROCODONE-ACETAMINOPHEN 5-325 MG PO TABS
1.0000 | ORAL_TABLET | Freq: Once | ORAL | Status: AC
  Administered 2016-03-19: 1 via ORAL
  Filled 2016-03-19: qty 1

## 2016-03-19 MED ORDER — KETOROLAC TROMETHAMINE 60 MG/2ML IM SOLN
60.0000 mg | Freq: Once | INTRAMUSCULAR | Status: DC
  Filled 2016-03-19: qty 2

## 2016-03-19 NOTE — ED Notes (Signed)
Pt. reports low abdominal cramping onset this week with emesis x1 and occasional diarrhea.

## 2016-03-19 NOTE — ED Provider Notes (Signed)
CSN: [id number]     Arrival date & time 03/19/16  2240 History  By signing my name below, I, Phillis HaggisGabriella Gaje, attest that this documentation has been prepared under the direction and in the presence of [facility] Sosha Shepherd, NP-C. Electronically Signed: Phillis HaggisGabriella Gaje, ED Scribe. 03/19/2016. 11:43 PM.   Chief Complaint  Patient presents with  . Abdominal Pain   Patient is a 25 y.o. female presenting with abdominal pain. The history is provided by the patient. No language interpreter was used.  Abdominal Pain Pain location:  Suprapubic Pain quality: cramping   Pain radiates to:  Does not radiate Pain severity:  Moderate Onset quality:  Gradual Duration:  1 week Timing:  Constant Progression:  Worsening Chronicity:  New Ineffective treatments:  OTC medications Associated symptoms: diarrhea, nausea, vaginal bleeding and vomiting   Associated symptoms: no chills, no dysuria, no fever, no hematuria, no sore throat and no vaginal discharge   HPI Comments: Stephanie Gutierrez is a 25 y.o. female with a hx of HTN and trichomonas who presents to the Emergency Department complaining of cramping lower abdominal pain onset one week ago. Pt reports associated gradually worsening vaginal bleeding with clots and dyspareunia. LMP one week ago. Pt states that she typically has irregular period, but it is abnormal that she has such heavy bleeding right after her period ended. Pt states that she has been using 10 thin pads a day for her bleeding. Pt is not on BC. She has been monogamous with one female partner for 5 years. She last had sexual intercourse two days ago. She has taken Advil for pain to no relief. She denies hx of pregnancy, hx of PCOS, fever, chills, ear pain, sore throat, nausea, vomiting, diarrhea, dysuria, hematuria, vaginal discharge, or frequency. Pt currently does not have PCP. She does not take daily medications.   Past Medical History  Diagnosis Date  . Hypertension    History reviewed. No pertinent past  surgical history. Family History  Problem Relation Age of Onset  . Hypertension Mother   . CAD Other   . Diabetes Mellitus II Other    Social History  Substance Use Topics  . Smoking status: Current Every Day Smoker    Types: Cigarettes  . Smokeless tobacco: None  . Alcohol Use: Yes     Comment: rare   OB History    No data available     Review of Systems  Constitutional: Negative for fever and chills.  HENT: Negative for ear pain and sore throat.   Gastrointestinal: Positive for nausea, vomiting, abdominal pain and diarrhea.  Genitourinary: Positive for vaginal bleeding and dyspareunia. Negative for dysuria, hematuria and vaginal discharge.    Allergies  Review of patient's allergies indicates no known allergies.  Home Medications   Prior to Admission medications   Medication Sig Start Date End Date Taking? Authorizing Provider  butalbital-acetaminophen-caffeine (FIORICET) 50-325-40 MG per tablet Take 1-2 tablets by mouth every 6 (six) hours as needed for headache. 03/22/15 03/21/16 Yes Marisa Severinlga Otter, MD  megestrol (MEGACE) 40 MG tablet Take 1 tablet (40 mg total) by mouth daily. 03/20/16   Azaylia Fong Orlene OchM Sherrin Stahle, NP  naproxen (NAPROSYN) 500 MG tablet Take 1 tablet (500 mg total) by mouth 2 (two) times daily. 03/20/16   Ritika Hellickson Orlene OchM Fawnda Vitullo, NP   BP 115/72 mmHg  Pulse 85  Temp(Src) 98.6 F (37 C) (Oral)  Resp 18  Ht 5\' 4"  (1.626 m)  Wt 104.781 kg  BMI 39.63 kg/m2  SpO2 100%  LMP 03/12/2016  Physical Exam  Constitutional: She is oriented to person, place, and time. No distress.  Obese   HENT:  Head: Normocephalic and atraumatic.  Eyes: EOM are normal. Pupils are equal, round, and reactive to light.  Neck: Normal range of motion. Neck supple.  Cardiovascular: Normal rate, regular rhythm and normal heart sounds.   Pulmonary/Chest: Effort normal and breath sounds normal.  Abdominal: Soft. Bowel sounds are normal. There is tenderness in the right lower quadrant and left lower quadrant. There is  no CVA tenderness.  Lower abdominal tenderness to palpation  Genitourinary:  External genitalia without lesions, moderate blood vaginal vault. Positive CMT, bilateral adnexal tenderness, unable to palpate uterus due to patient habitus.   Musculoskeletal: Normal range of motion.  Neurological: She is alert and oriented to person, place, and time.  Skin: Skin is warm and dry.  Psychiatric: She has a normal mood and affect. Her behavior is normal.  Nursing note and vitals reviewed.   ED Course  Procedures (including critical care time) DIAGNOSTIC STUDIES: Oxygen Saturation is 100% on RA, normal by my interpretation.    COORDINATION OF CARE: 11:38 PM-Discussed treatment plan which includes labs, pelvic exam, and US of abdomen with pt at bedside and pt agreed to plan.    Labs Review Results for orders placed or performed during the hospital encounter of 03/19/16 (from the past 24 hour(s))  Lipase, blood     Status: None   Collection Time: 03/19/16 10:50 PM  Result Value Ref Range   Lipase 19 11 - 51 U/L  Comprehensive metabolic panel     Status: Abnormal   Collection Time: 03/19/16 10:50 PM  Result Value Ref Range   Sodium 136 135 - 145 mmol/L   Potassium 3.8 3.5 - 5.1 mmol/L   Chloride 106 101 - 111 mmol/L   CO2 23 22 - 32 mmol/L   Glucose, Bld 135 (H) 65 - 99 mg/dL   BUN 9 6 - 20 mg/dL   Creatinine, Ser 0.980.86 0.44 - 1.00 mg/dL   Calcium 9.1 8.9 - 11.910.3 mg/dL   Total Protein 7.1 6.5 - 8.1 g/dL   Albumin 3.8 3.5 - 5.0 g/dL   AST 21 15 - 41 U/L   ALT 24 14 - 54 U/L   Alkaline Phosphatase 70 38 - 126 U/L   Total Bilirubin 0.4 0.3 - 1.2 mg/dL   GFR calc non Af Amer >60 >60 mL/min   GFR calc Af Amer >60 >60 mL/min   Anion gap 7 5 - 15  CBC     Status: None   Collection Time: 03/19/16 10:50 PM  Result Value Ref Range   WBC 6.5 4.0 - 10.5 K/uL   RBC 4.61 3.87 - 5.11 MIL/uL   Hemoglobin 12.9 12.0 - 15.0 g/dL   HCT 14.738.5 82.936.0 - 56.246.0 %   MCV 83.5 78.0 - 100.0 fL   MCH 28.0 26.0 -  34.0 pg   MCHC 33.5 30.0 - 36.0 g/dL   RDW 13.012.1 86.511.5 - 78.415.5 %   Platelets 217 150 - 400 K/uL  Urinalysis, Routine w reflex microscopic     Status: Abnormal   Collection Time: 03/19/16 10:59 PM  Result Value Ref Range   Color, Urine YELLOW YELLOW   APPearance CLOUDY (A) CLEAR   Specific Gravity, Urine 1.023 1.005 - 1.030   pH 5.0 5.0 - 8.0   Glucose, UA NEGATIVE NEGATIVE mg/dL   Hgb urine dipstick LARGE (A) NEGATIVE   Bilirubin Urine NEGATIVE NEGATIVE  Ketones, ur NEGATIVE NEGATIVE mg/dL   Protein, ur NEGATIVE NEGATIVE mg/dL   Nitrite NEGATIVE NEGATIVE   Leukocytes, UA TRACE (A) NEGATIVE  Urine microscopic-add on     Status: Abnormal   Collection Time: 03/19/16 10:59 PM  Result Value Ref Range   Squamous Epithelial / LPF 6-30 (A) NONE SEEN   WBC, UA 0-5 0 - 5 WBC/hpf   RBC / HPF TOO NUMEROUS TO COUNT 0 - 5 RBC/hpf   Bacteria, UA RARE (A) NONE SEEN  Wet prep, genital     Status: Abnormal   Collection Time: 03/20/16 12:07 AM  Result Value Ref Range   Yeast Wet Prep HPF POC NONE SEEN NONE SEEN   Trich, Wet Prep NONE SEEN NONE SEEN   Clue Cells Wet Prep HPF POC PRESENT (A) NONE SEEN   WBC, Wet Prep HPF POC NONE SEEN NONE SEEN   Sperm NONE SEEN   I-Stat Beta hCG blood, ED ([facility], [facility], [facility] only)     Status: None   Collection Time: 03/20/16 12:37 AM  Result Value Ref Range   I-stat hCG, quantitative <5.0 <5 mIU/mL   Comment 3             Imaging Review  I have personally reviewed and evaluated these images and lab results as part of my medical decision-making.   MDM   Patient is nontoxic, nonseptic appearing, in no apparent distress.  Patient's pain and other symptoms adequately managed in emergency department.  Labs, imaging and vitals reviewed.  Patient does not meet the SIRS or Sepsis criteria.  On repeat exam patient does not have a surgical abdomin and there are no peritoneal signs.  No indication of appendicitis, bowel obstruction, bowel perforation, cholecystitis,  diverticulitis, or ectopic pregnancy.   Final diagnoses:  Pelvic pain in female  Abnormal vaginal bleeding   Patient awaiting ultrasound results. Care turned over to Lajuana Ripple, [facility]  I personally performed the services described in this documentation, which was scribed in my presence. The recorded information has been reviewed and is accurate.   [address] [place], [place] 03/20/16 1610  Shon Baton, MD 03/20/16 2300

## 2016-03-19 NOTE — ED Notes (Signed)
Pelvic cart at bedside. 

## 2016-03-20 ENCOUNTER — Emergency Department (HOSPITAL_COMMUNITY): Payer: Medicaid Other

## 2016-03-20 LAB — WET PREP, GENITAL
Sperm: NONE SEEN
TRICH WET PREP: NONE SEEN
WBC WET PREP: NONE SEEN
YEAST WET PREP: NONE SEEN

## 2016-03-20 LAB — GC/CHLAMYDIA PROBE AMP (~~LOC~~) NOT AT ARMC
Chlamydia: NEGATIVE
Neisseria Gonorrhea: NEGATIVE

## 2016-03-20 LAB — I-STAT BETA HCG BLOOD, ED (MC, WL, AP ONLY): I-stat hCG, quantitative: <5 m[IU]/mL (ref <5)

## 2016-03-20 MED ORDER — NAPROXEN 500 MG PO TABS: 500.0000 mg | ORAL_TABLET | Freq: Two times a day (BID) | ORAL | Status: DC

## 2016-03-20 MED ORDER — MEGESTROL ACETATE 40 MG PO TABS: 40.0000 mg | ORAL_TABLET | Freq: Every day | ORAL | Status: DC

## 2016-03-20 NOTE — Discharge Instructions (Signed)
[facility] for follow up. If your symptoms persist [facility] (called M[facility] Ad[facility]) for further evaluation.  Pelvic Pain, Female Female pelvic pain can be caused by many different things and start from a variety of places. Pelvic pain refers to pain that is located in the lower half of the abdomen and between your hips. The pain may occur over a short period of time (acute) or may be reoccurring (chronic). The cause of pelvic pain may be related to disorders affecting the female reproductive organs (gynecologic), but it may also be related to the bladder, kidney stones, an intestinal complication, or muscle or skeletal problems. Getting help right away for pelvic pain is important, especially if there has been severe, sharp, or a sudden onset of unusual pain. It is also important to get help right away because some types of pelvic pain can be life threatening.  CAUSES  Below are only some of the causes of pelvic pain. The causes of pelvic pain can be in one of several categories.   Gynecologic.  Pelvic inflammatory disease.  Sexually transmitted infection.  Ovarian cyst or a twisted ovarian ligament (ovarian torsion).  Uterine lining that grows outside the uterus (endometriosis).  Fibroids, cysts, or tumors.  Ovulation.  Pregnancy.  Pregnancy that occurs outside the uterus (ectopic pregnancy).  Miscarriage.  Labor.  Abruption of the placenta or ruptured uterus.  Infection.  Uterine infection (endometritis).  Bladder infection.  Diverticulitis.  Miscarriage related to a uterine infection (septic abortion).  Bladder.  Inflammation of the bladder (cystitis).  Kidney stone(s).  Gastrointestinal.  Constipation.  Diverticulitis.  Neurologic.  Trauma.  Feeling pelvic pain because of mental or emotional causes (psychosomatic).  Cancers of the bowel or pelvis. EVALUATION  Your caregiver  will want to take a careful history of your concerns. This includes recent changes in your health, a careful gynecologic history of your periods (menses), and a sexual history. Obtaining your family history and medical history is also important. Your caregiver may suggest a pelvic exam. A pelvic exam will help identify the location and severity of the pain. It also helps in the evaluation of which organ system may be involved. In order to identify the cause of the pelvic pain and be properly treated, your caregiver may order tests. These tests may include:   A pregnancy test.  Pelvic ultrasonography.  An X-ray exam of the abdomen.  A urinalysis or evaluation of vaginal discharge.  Blood tests. HOME CARE INSTRUCTIONS   Only take over-the-counter or prescription medicines for pain, discomfort, or fever as directed by your caregiver.   Rest as directed by your caregiver.   Eat a balanced diet.   Drink enough fluids to make your urine clear or pale yellow, or as directed.   Avoid sexual intercourse if it causes pain.   Apply warm or cold compresses to the lower abdomen depending on which one helps the pain.   Avoid stressful situations.   Keep a journal of your pelvic pain. Write down when it started, where the pain is located, and if there are things that seem to be associated with the pain, such as food or your menstrual cycle.  Follow up with your caregiver as directed.  SEEK MEDICAL CARE IF:  Your medicine does not help your pain.  You have abnormal vaginal discharge. SEEK IMMEDIATE MEDICAL CARE IF:   You have heavy bleeding from the vagina.   Your pelvic pain increases.  You feel light-headed or faint.   °· You have chills.   °· You have pain with urination or blood in your urine.   °· You have uncontrolled diarrhea or vomiting.   °· You have a fever or persistent symptoms for more than 3 days. °· You have a fever and your symptoms suddenly get worse.   °· You are  being physically or sexually abused. °  °This information is not intended to replace advice given to you by your health care provider. Make sure you discuss any questions you have with your health care provider. °  °Document Released: 07/29/2004 Document Revised: 05/23/2015 Document Reviewed: 12/22/2011 °Elsevier Interactive Patient Education ©2016 [employer] ° °

## 2016-03-20 NOTE — ED Notes (Signed)
Patient verbalized understanding of discharge instructions and denies any further needs or questions at this time. VS stable. Patient ambulatory with steady gait.  

## 2016-04-01 ENCOUNTER — Emergency Department (HOSPITAL_COMMUNITY)
Admission: EM | Admit: 2016-04-01 | Discharge: 2016-04-02 | Disposition: A | Discharge status: Home / Self Care | Payer: Medicaid Other | Attending: Emergency Medicine | Admitting: Emergency Medicine

## 2016-04-01 ENCOUNTER — Encounter (HOSPITAL_COMMUNITY): Payer: Self-pay

## 2016-04-01 ENCOUNTER — Emergency Department (HOSPITAL_COMMUNITY): Payer: Medicaid Other

## 2016-04-01 DIAGNOSIS — I10 Essential (primary) hypertension: Secondary | ICD-10-CM | POA: Insufficient documentation

## 2016-04-01 DIAGNOSIS — F17210 Nicotine dependence, cigarettes, uncomplicated: Secondary | ICD-10-CM | POA: Diagnosis not present

## 2016-04-01 DIAGNOSIS — Z79899 Other long term (current) drug therapy: Secondary | ICD-10-CM | POA: Insufficient documentation

## 2016-04-01 DIAGNOSIS — R079 Chest pain, unspecified: Secondary | ICD-10-CM | POA: Diagnosis not present

## 2016-04-01 LAB — BASIC METABOLIC PANEL
Anion gap: 8 (ref 5–15)
BUN: 8 mg/dL (ref 6–20)
CALCIUM: 9 mg/dL (ref 8.9–10.3)
CHLORIDE: 106 mmol/L (ref 101–111)
CO2: 25 mmol/L (ref 22–32)
CREATININE: 0.89 mg/dL (ref 0.44–1.00)
GFR calc non Af Amer: >60 mL/min (ref >60)
GLUCOSE: 118 mg/dL — AB (ref 65–99)
Potassium: 3.7 mmol/L (ref 3.5–5.1)
Sodium: 139 mmol/L (ref 135–145)

## 2016-04-01 LAB — CBC
HCT: 37.5 % (ref 36.0–46.0)
Hemoglobin: 12.5 g/dL (ref 12.0–15.0)
MCH: 27.7 pg (ref 26.0–34.0)
MCHC: 33.3 g/dL (ref 30.0–36.0)
MCV: 83 fL (ref 78.0–100.0)
PLATELETS: 222 10*3/uL (ref 150–400)
RBC: 4.52 MIL/uL (ref 3.87–5.11)
RDW: 11.7 % (ref 11.5–15.5)
WBC: 9.1 10*3/uL (ref 4.0–10.5)

## 2016-04-01 LAB — I-STAT TROPONIN, ED: TROPONIN I, POC: 0.01 ng/mL (ref 0.00–0.08)

## 2016-04-01 LAB — POC URINE PREG, ED: Preg Test, Ur: NEGATIVE

## 2016-04-01 NOTE — ED Notes (Signed)
Per pt she started having chest tightness and pain on yesterday; pt states it feels like her whole right side is going to pop; pt states some SOB and dizziness upon standing; pt states she feel like she has been running a race; Pt states pain at 10/10 on arrival in central chest; Pt a&ox 4 on arrival.

## 2016-04-02 ENCOUNTER — Encounter (HOSPITAL_COMMUNITY): Payer: Self-pay | Admitting: Radiology

## 2016-04-02 ENCOUNTER — Emergency Department (HOSPITAL_COMMUNITY): Payer: Medicaid Other

## 2016-04-02 LAB — D-DIMER, QUANTITATIVE: D-Dimer, Quant: 0.7 ug/mL-FEU — ABNORMAL HIGH (ref 0.00–0.50)

## 2016-04-02 MED ORDER — KETOROLAC TROMETHAMINE 30 MG/ML IJ SOLN
30.0000 mg | Freq: Once | INTRAMUSCULAR | Status: AC
  Administered 2016-04-02: 30 mg via INTRAVENOUS
  Filled 2016-04-02: qty 1

## 2016-04-02 MED ORDER — IOPAMIDOL (ISOVUE-370) INJECTION 76%
INTRAVENOUS | Status: AC
  Administered 2016-04-02: 100 mL
  Filled 2016-04-02: qty 100

## 2016-04-02 MED ORDER — MORPHINE SULFATE (PF) 4 MG/ML IV SOLN
4.0000 mg | Freq: Once | INTRAVENOUS | Status: AC
  Administered 2016-04-02: 4 mg via INTRAVENOUS
  Filled 2016-04-02: qty 1

## 2016-04-02 MED ORDER — IBUPROFEN 600 MG PO TABS: 600.0000 mg | ORAL_TABLET | Freq: Three times a day (TID) | ORAL | Status: DC | PRN

## 2016-04-02 NOTE — ED Notes (Signed)
Pt back from CT

## 2016-04-02 NOTE — ED Provider Notes (Signed)
CSN: [id number]     Arrival date & time 04/01/16  2203 History  By signing my name below, I, Stephanie Gutierrez, attest that this documentation has been prepared under the direction and in the presence of Azalia Bilis, MD. Electronically Signed: Bridgette Gutierrez, ED Scribe. 04/02/2016. 12:13 AM.     Chief Complaint  Patient presents with  . Chest Pain   The history is provided by the patient. No language interpreter was used.   HPI Comments: Stephanie Gutierrez is a 25 y.o. female who presents to the Emergency Department complaining of sudden onset, gradually worsening, tight and achy chest pain radiating to her back one day ago. Pt also has associated mild cough and shortness of breath. Pt states pain is exacerbated with heavy breathing and sneezing. Pt has not tried to alleviate the pain. Pt is not on birth control. She is a smoker. Pt has no h/o asthma, blood clots, or heart problems. No recent long travel or recent surgery. Pt denies leg swelling, fever, and chills.  Past Medical History  Diagnosis Date  . Hypertension    History reviewed. No pertinent past surgical history. Family History  Problem Relation Age of Onset  . Hypertension Mother   . CAD Other   . Diabetes Mellitus II Other    Social History  Substance Use Topics  . Smoking status: Current Every Day Smoker    Types: Cigarettes  . Smokeless tobacco: None  . Alcohol Use: Yes     Comment: rare   OB History    No data available     Review of Systems A complete 10 system review of systems was obtained and all systems are negative except as noted in the HPI and PMH.   Allergies  Review of patient's allergies indicates no known allergies.  Home Medications   Prior to Admission medications   Medication Sig Start Date End Date Taking? Authorizing Provider  megestrol (MEGACE) 40 MG tablet Take 1 tablet (40 mg total) by mouth daily. 03/20/16   Hope Orlene Och, NP  naproxen (NAPROSYN) 500 MG tablet Take 1 tablet (500 mg total) by mouth 2 (two)  times daily. 03/20/16   Hope Orlene Och, NP   BP 148/92 mmHg  Pulse 103  Temp(Src) 99.8 F (37.7 C) (Oral)  Resp 16  Wt 230 lb (104.327 kg)  SpO2 100%  LMP 03/12/2016 Physical Exam  Constitutional: She is oriented to person, place, and time. She appears well-developed and well-nourished. No distress.  HENT:  Head: Normocephalic and atraumatic.  Eyes: EOM are normal.  Neck: Normal range of motion.  Cardiovascular: Normal rate, regular rhythm and normal heart sounds.   Pulmonary/Chest: Effort normal and breath sounds normal.  Abdominal: Soft. She exhibits no distension. There is no tenderness.  Musculoskeletal: Normal range of motion.  Neurological: She is alert and oriented to person, place, and time.  Skin: Skin is warm and dry.  Psychiatric: She has a normal mood and affect. Judgment normal.  Nursing note and vitals reviewed.   ED Course  Procedures  DIAGNOSTIC STUDIES: Oxygen Saturation is 100% on RA, normal by my interpretation.    COORDINATION OF CARE: 12:13 AM Discussed treatment plan with pt at bedside which includes IV fluids, blood work and anti-inflammatory Rx and pt agreed to plan.  Labs Review Labs Reviewed  BASIC METABOLIC PANEL - Abnormal; Notable for the following:    Glucose, Bld 118 (*)    All other components within normal limits  D-DIMER, QUANTITATIVE (NOT AT [facility]) -  Abnormal; Notable for the following:    D-Dimer, Quant 0.70 (*)    All other components within normal limits  CBC  I-STAT TROPOININ, ED  POC URINE PREG, ED    Imaging Review Dg Chest 2 View  04/01/2016  CLINICAL DATA:  Chest pain EXAM: CHEST  2 VIEW COMPARISON:  None. FINDINGS: The heart size and mediastinal contours are within normal limits. Both lungs are clear. The visualized skeletal structures are unremarkable. IMPRESSION: No active cardiopulmonary disease. Electronically Signed   By: Gerome Samavid  Williams III M.D   On: 04/01/2016 22:29   Ct Angio Chest Pe W/cm &/or Wo Cm  04/02/2016   CLINICAL DATA:  Pleuritic chest pain and shortness of breath. EXAM: CT ANGIOGRAPHY CHEST WITH CONTRAST TECHNIQUE: Multidetector CT imaging of the chest was performed using the standard protocol during bolus administration of intravenous contrast. Multiplanar CT image reconstructions and MIPs were obtained to evaluate the vascular anatomy. CONTRAST:  100 cc Isovue 370 intravenous COMPARISON:  None. FINDINGS: Cardiovascular: Borderline cardiomegaly. No pericardial effusion. Negative thoracic aorta. No visible pulmonary embolism. Patient size degrades peripheral, small-vessel evaluation. Mediastinum:  Negative for adenopathy. Lungs/Pleura: Trace layering right pleural effusion with lower lobe opacity that is likely atelectatic. Negative for edema. Upper abdomen: No acute findings. Musculoskeletal: Negative. Review of the MIP images confirms the above findings. IMPRESSION: 1. Trace right pleural effusion with lower lobe atelectasis. 2. No visible pulmonary embolism. Electronically Signed   By: Marnee SpringJonathon  Watts M.D.   On: 04/02/2016 02:41   I have personally reviewed and evaluated these images and lab results as part of my medical decision-making.   EKG Interpretation   Date/Time:  Tuesday April 01 2016 22:05:09 EDT Ventricular Rate:  105 PR Interval:  154 QRS Duration: 82 QT Interval:  338 QTC Calculation: 446 R Axis:   93 Text Interpretation:  Sinus tachycardia Rightward axis Borderline ECG No  significant change was found Confirmed by Briawna Carver  MD, Caryn BeeKEVIN ([id number]) on  04/01/2016 11:36:00 PM      MDM   Final diagnoses:  Chest pain, unspecified chest pain type    Patient is overall well-appearing.  Elevated heart rate 103 in the emergency department.  D-dimer elevated.  CT angiogram negative for pulmonary embolism or pneumonia.  Likely chest wall pain or pleurisy.  Home with anti-inflammatories.  Primary care follow-up.  She understands to return to the emergency department for new or worsening  symptoms  I personally performed the services described in this documentation, which was scribed in my presence. The recorded information has been reviewed and is accurate.       Azalia BilisKevin Danyale Ridinger, MD 04/02/16 [phone]

## 2016-04-02 NOTE — ED Notes (Signed)
Patient transported to CT 

## 2016-04-02 NOTE — ED Notes (Signed)
Pt dc home. Pt denies SOB. Pt appears with no s/s of respiratory distress. Pt ambulatory. Discussed dc instructions and prescriptions.

## 2016-04-02 NOTE — Discharge Instructions (Signed)
Nonspecific Chest Pain  °Chest pain can be caused by many different conditions. There is always a chance that your pain could be related to something serious, such as a heart attack or a blood clot in your lungs. Chest pain can also be caused by conditions that are not life-threatening. If you have chest pain, it is very important to follow up with your health care provider. °CAUSES  °Chest pain can be caused by: °· Heartburn. °· Pneumonia or bronchitis. °· Anxiety or stress. °· Inflammation around your heart (pericarditis) or lung (pleuritis or pleurisy). °· A blood clot in your lung. °· A collapsed lung (pneumothorax). It can develop suddenly on its own (spontaneous pneumothorax) or from trauma to the chest. °· Shingles infection (varicella-zoster virus). °· Heart attack. °· Damage to the bones, muscles, and cartilage that make up your chest wall. This can include: °¨ Bruised bones due to injury. °¨ Strained muscles or cartilage due to frequent or repeated coughing or overwork. °¨ Fracture to one or more ribs. °¨ Sore cartilage due to inflammation (costochondritis). °RISK FACTORS  °Risk factors for chest pain may include: °· Activities that increase your risk for trauma or injury to your chest. °· Respiratory infections or conditions that cause frequent coughing. °· Medical conditions or overeating that can cause heartburn. °· Heart disease or family history of heart disease. °· Conditions or health behaviors that increase your risk of developing a blood clot. °· Having had chicken pox (varicella zoster). °SIGNS AND SYMPTOMS °Chest pain can feel like: °· Burning or tingling on the surface of your chest or deep in your chest. °· Crushing, pressure, aching, or squeezing pain. °· Dull or sharp pain that is worse when you move, cough, or take a deep breath. °· Pain that is also felt in your back, neck, shoulder, or arm, or pain that spreads to any of these areas. °Your chest pain may come and go, or it may stay  constant. °DIAGNOSIS °Lab tests or other studies may be needed to find the cause of your pain. Your health care provider may have you take a test called an ambulatory ECG (electrocardiogram). An ECG records your heartbeat patterns at the time the test is performed. You may also have other tests, such as: °· Transthoracic echocardiogram (TTE). During echocardiography, sound waves are used to create a picture of all of the heart structures and to look at how blood flows through your heart. °· Transesophageal echocardiogram (TEE). This is a more advanced imaging test that obtains images from inside your body. It allows your health care provider to see your heart in finer detail. °· Cardiac monitoring. This allows your health care provider to monitor your heart rate and rhythm in real time. °· Holter monitor. This is a portable device that records your heartbeat and can help to diagnose abnormal heartbeats. It allows your health care provider to track your heart activity for several days, if needed. °· Stress tests. These can be done through exercise or by taking medicine that makes your heart beat more quickly. °· Blood tests. °· Imaging tests. °TREATMENT  °Your treatment depends on what is causing your chest pain. Treatment may include: °· Medicines. These may include: °¨ Acid blockers for heartburn. °¨ Anti-inflammatory medicine. °¨ Pain medicine for inflammatory conditions. °¨ Antibiotic medicine, if an infection is present. °¨ Medicines to dissolve blood clots. °¨ Medicines to treat coronary artery disease. °· Supportive care for conditions that do not require medicines. This may include: °¨ Resting. °¨ Applying heat   or cold packs to injured areas. °¨ Limiting activities until pain decreases. °HOME CARE INSTRUCTIONS °· If you were prescribed an antibiotic medicine, finish it all even if you start to feel better. °· Avoid any activities that bring on chest pain. °· Do not use any tobacco products, including  cigarettes, chewing tobacco, or electronic cigarettes. If you need help quitting, ask your health care provider. °· Do not drink alcohol. °· Take medicines only as directed by your health care provider. °· Keep all follow-up visits as directed by your health care provider. This is important. This includes any further testing if your chest pain does not go away. °· If heartburn is the cause for your chest pain, you may be told to keep your head raised (elevated) while sleeping. This reduces the chance that acid will go from your stomach into your esophagus. °· Make lifestyle changes as directed by your health care provider. These may include: °¨ Getting regular exercise. Ask your health care provider to suggest some activities that are safe for you. °¨ Eating a heart-healthy diet. A registered dietitian can help you to learn healthy eating options. °¨ Maintaining a healthy weight. °¨ Managing diabetes, if necessary. °¨ Reducing stress. °SEEK MEDICAL CARE IF: °· Your chest pain does not go away after treatment. °· You have a rash with blisters on your chest. °· You have a fever. °SEEK IMMEDIATE MEDICAL CARE IF:  °· Your chest pain is worse. °· You have an increasing cough, or you cough up blood. °· You have severe abdominal pain. °· You have severe weakness. °· You faint. °· You have chills. °· You have sudden, unexplained chest discomfort. °· You have sudden, unexplained discomfort in your arms, back, neck, or jaw. °· You have shortness of breath at any time. °· You suddenly start to sweat, or your skin gets clammy. °· You feel nauseous or you vomit. °· You suddenly feel light-headed or dizzy. °· Your heart begins to beat quickly, or it feels like it is skipping beats. °These symptoms may represent a serious problem that is an emergency. Do not wait to see if the symptoms will go away. Get medical help right away. Call your local emergency services (911 in the [place] Do not drive yourself to the hospital. °  °This  information is not intended to replace advice given to you by your health care provider. Make sure you discuss any questions you have with your health care provider. °  °Document Released: 06/11/2005 Document Revised: 09/22/2014 Document Reviewed: 04/07/2014 °Elsevier Interactive Patient Education ©2016 [employer] ° °

## 2016-05-31 ENCOUNTER — Inpatient Hospital Stay (HOSPITAL_COMMUNITY)
Admission: AD | Admit: 2016-05-31 | Discharge: 2016-06-01 | Disposition: A | Discharge status: Home / Self Care | Payer: Medicaid Other | Source: Ambulatory Visit | Attending: Obstetrics & Gynecology | Admitting: Obstetrics & Gynecology

## 2016-05-31 ENCOUNTER — Encounter (HOSPITAL_COMMUNITY): Payer: Self-pay | Admitting: *Deleted

## 2016-05-31 DIAGNOSIS — F17210 Nicotine dependence, cigarettes, uncomplicated: Secondary | ICD-10-CM | POA: Diagnosis not present

## 2016-05-31 DIAGNOSIS — N939 Abnormal uterine and vaginal bleeding, unspecified: Secondary | ICD-10-CM

## 2016-05-31 DIAGNOSIS — N921 Excessive and frequent menstruation with irregular cycle: Secondary | ICD-10-CM | POA: Diagnosis not present

## 2016-05-31 DIAGNOSIS — Z3202 Encounter for pregnancy test, result negative: Secondary | ICD-10-CM | POA: Diagnosis not present

## 2016-05-31 DIAGNOSIS — I10 Essential (primary) hypertension: Secondary | ICD-10-CM | POA: Insufficient documentation

## 2016-05-31 DIAGNOSIS — N390 Urinary tract infection, site not specified: Secondary | ICD-10-CM | POA: Insufficient documentation

## 2016-05-31 LAB — URINALYSIS, ROUTINE W REFLEX MICROSCOPIC
Bilirubin Urine: NEGATIVE
Glucose, UA: NEGATIVE mg/dL
Ketones, ur: NEGATIVE mg/dL
NITRITE: POSITIVE — AB
Protein, ur: 100 mg/dL — AB
SPECIFIC GRAVITY, URINE: 1.02 (ref 1.005–1.030)
pH: 5 (ref 5.0–8.0)

## 2016-05-31 LAB — CBC
HEMATOCRIT: 36 % (ref 36.0–46.0)
HEMOGLOBIN: 12.2 g/dL (ref 12.0–15.0)
MCH: 26.9 pg (ref 26.0–34.0)
MCHC: 33.9 g/dL (ref 30.0–36.0)
MCV: 79.3 fL (ref 78.0–100.0)
Platelets: 231 10*3/uL (ref 150–400)
RBC: 4.54 MIL/uL (ref 3.87–5.11)
RDW: 12.9 % (ref 11.5–15.5)
WBC: 6.6 10*3/uL (ref 4.0–10.5)

## 2016-05-31 LAB — WET PREP, GENITAL
Clue Cells Wet Prep HPF POC: NONE SEEN
Sperm: NONE SEEN
Trich, Wet Prep: NONE SEEN
YEAST WET PREP: NONE SEEN

## 2016-05-31 LAB — URINE MICROSCOPIC-ADD ON

## 2016-05-31 LAB — POCT PREGNANCY, URINE: PREG TEST UR: NEGATIVE

## 2016-05-31 MED ORDER — MEDROXYPROGESTERONE ACETATE 10 MG PO TABS: 10.0000 mg | ORAL_TABLET | Freq: Every day | ORAL | 0 refills | Status: DC

## 2016-05-31 MED ORDER — IBUPROFEN 600 MG PO TABS
600.0000 mg | ORAL_TABLET | Freq: Once | ORAL | Status: AC
  Administered 2016-05-31: 600 mg via ORAL
  Filled 2016-05-31: qty 1

## 2016-05-31 MED ORDER — MEDROXYPROGESTERONE ACETATE 10 MG PO TABS
10.0000 mg | ORAL_TABLET | Freq: Every day | ORAL | Status: DC
  Filled 2016-05-31: qty 1

## 2016-05-31 MED ORDER — SULFAMETHOXAZOLE-TRIMETHOPRIM 800-160 MG PO TABS: 1.0000 | ORAL_TABLET | Freq: Two times a day (BID) | ORAL | 0 refills | Status: AC

## 2016-05-31 MED ORDER — MEDROXYPROGESTERONE ACETATE 10 MG PO TABS
10.0000 mg | ORAL_TABLET | Freq: Once | ORAL | Status: AC
  Administered 2016-05-31: 10 mg via ORAL
  Filled 2016-05-31: qty 1

## 2016-05-31 MED ORDER — TRAMADOL HCL 50 MG PO TABS: 50.0000 mg | ORAL_TABLET | Freq: Four times a day (QID) | ORAL | 0 refills | Status: DC | PRN

## 2016-05-31 MED ORDER — TRAMADOL HCL 50 MG PO TABS
50.0000 mg | ORAL_TABLET | Freq: Once | ORAL | Status: AC
  Administered 2016-05-31: 50 mg via ORAL
  Filled 2016-05-31: qty 1

## 2016-05-31 MED ORDER — TRAMADOL HCL 50 MG PO TABS: 50.0000 mg | ORAL_TABLET | Freq: Once | ORAL | Status: DC

## 2016-05-31 NOTE — Discharge Instructions (Signed)
Abnormal Uterine Bleeding °Abnormal uterine bleeding can affect women at various stages in life, including teenagers, women in their reproductive years, pregnant women, and women who have reached menopause. Several kinds of uterine bleeding are considered abnormal, including: °· Bleeding or spotting between periods.   °· Bleeding after sexual intercourse.   °· Bleeding that is heavier or more than normal.   °· Periods that last longer than usual. °· Bleeding after menopause.   °Many cases of abnormal uterine bleeding are minor and simple to treat, while others are more serious. Any type of abnormal bleeding should be evaluated by your health care provider. Treatment will depend on the cause of the bleeding. °HOME CARE INSTRUCTIONS °Monitor your condition for any changes. The following actions may help to alleviate any discomfort you are experiencing: °· Avoid the use of tampons and douches as directed by your health care provider. °· Change your pads frequently. °You should get regular pelvic exams and Pap tests. Keep all follow-up appointments for diagnostic tests as directed by your health care provider.  °SEEK MEDICAL CARE IF:  °· Your bleeding lasts more than 1 week.   °· You feel dizzy at times.   °SEEK IMMEDIATE MEDICAL CARE IF:  °· You pass out.   °· You are changing pads every 15 to 30 minutes.   °· You have abdominal pain. °· You have a fever.   °· You become sweaty or weak.   °· You are passing large blood clots from the vagina.   °· You start to feel nauseous and vomit. °MAKE SURE YOU:  °· Understand these instructions. °· Will watch your condition. °· Will get help right away if you are not doing well or get worse. °  °This information is not intended to replace advice given to you by your health care provider. Make sure you discuss any questions you have with your health care provider. °  °Document Released: 09/01/2005 Document Revised: 09/06/2013 Document Reviewed: 03/31/2013 °Elsevier Interactive  Patient Education ©2016 [employer] ° °Urinary Tract Infection °Urinary tract infections (UTIs) can develop anywhere along your urinary tract. Your urinary tract is your body's drainage system for removing wastes and extra water. Your urinary tract includes two kidneys, two ureters, a bladder, and a urethra. Your kidneys are a pair of bean-shaped organs. Each kidney is about the size of your fist. They are located below your ribs, one on each side of your spine. °CAUSES °Infections are caused by microbes, which are microscopic organisms, including fungi, viruses, and bacteria. These organisms are so small that they can only be seen through a microscope. Bacteria are the microbes that most commonly cause UTIs. °SYMPTOMS  °Symptoms of UTIs may vary by age and gender of the patient and by the location of the infection. Symptoms in young women typically include a frequent and intense urge to urinate and a painful, burning feeling in the bladder or urethra during urination. Older women and men are more likely to be tired, shaky, and weak and have muscle aches and abdominal pain. A fever may mean the infection is in your kidneys. Other symptoms of a kidney infection include pain in your back or sides below the ribs, nausea, and vomiting. °DIAGNOSIS °To diagnose a UTI, your caregiver will ask you about your symptoms. Your caregiver will also ask you to provide a urine sample. The urine sample will be tested for bacteria and white blood cells. White blood cells are made by your body to help fight infection. °TREATMENT  °Typically, UTIs can be treated with medication. Because most UTIs   are caused by a bacterial infection, they usually can be treated with the use of antibiotics. The choice of antibiotic and length of treatment depend on your symptoms and the type of bacteria causing your infection. °HOME CARE INSTRUCTIONS °· If you were prescribed antibiotics, take them exactly as your caregiver instructs you. Finish the  medication even if you feel better after you have only taken some of the medication. °· Drink enough water and fluids to keep your urine clear or pale yellow. °· Avoid caffeine, tea, and carbonated beverages. They tend to irritate your bladder. °· Empty your bladder often. Avoid holding urine for long periods of time. °· Empty your bladder before and after sexual intercourse. °· After a bowel movement, women should cleanse from front to back. Use each tissue only once. °SEEK MEDICAL CARE IF:  °· You have back pain. °· You develop a fever. °· Your symptoms do not begin to resolve within 3 days. °SEEK IMMEDIATE MEDICAL CARE IF:  °· You have severe back pain or lower abdominal pain. °· You develop chills. °· You have nausea or vomiting. °· You have continued burning or discomfort with urination. °MAKE SURE YOU:  °· Understand these instructions. °· Will watch your condition. °· Will get help right away if you are not doing well or get worse. °  °This information is not intended to replace advice given to you by your health care provider. Make sure you discuss any questions you have with your health care provider. °  °Document Released: 06/11/2005 Document Revised: 05/23/2015 Document Reviewed: 10/10/2011 °Elsevier Interactive Patient Education ©2016 [employer] ° °

## 2016-05-31 NOTE — MAU Note (Signed)
Patient presents to mau with c/o heavy vaginal bleeding; endorses passing large clots. States woke up and was covered in blood. Reporting abdominal pain that started yesterday and has gotten worse/ rating pain a 9/10 currently. Has taken tylenol and bc poweder. Vaginal bleeding started on 9/9; states has irregular menstrual cycles.

## 2016-05-31 NOTE — MAU Note (Signed)
Patient states she is wearing 2 pads at a time; soaking through them twice an hour.

## 2016-05-31 NOTE — MAU Provider Note (Signed)
History     CSN: [id number]  Arrival date and time: 05/31/16 2046   First Provider Initiated Contact with Patient 05/31/16 2230      Chief Complaint  Patient presents with  . Vaginal Bleeding   HPI   [name] is a 25 y.o. female [id number] here with heavy vaginal bleeding, she has a history of irregular menstrual cycles and heavy menstrual cycles. This menstrual cycle started on 9/10. Her cycle comes every 5 months or so. The bleeding is described as heavy.  She is having lower abdominal pain; on both sides. She has tried taking ibuprofen 800 mg yesterday and 400 mg today. This helped some. The last time she took pain medication was Thursday night.  She rates her pain 8/10.  She was given an RX for megace, however lost it and never filled it.   OB History    Gravida Para Term Preterm AB Living   0 0 0 0 0 0   SAB TAB Ectopic Multiple Live Births   0 0 0 0 0      Past Medical History:  Diagnosis Date  . Hypertension     Past Surgical History:  Procedure Laterality Date  . NO PAST SURGERIES      Family History  Problem Relation Age of Onset  . Hypertension Mother   . CAD Other   . Diabetes Mellitus II Other     Social History  Substance Use Topics  . Smoking status: Current Every Day Smoker    Types: Cigarettes  . Smokeless tobacco: Never Used  . Alcohol use Yes     Comment: rare    Allergies: No Known Allergies  Prescriptions Prior to Admission  Medication Sig Dispense Refill Last Dose  . ibuprofen (ADVIL,MOTRIN) 600 MG tablet Take 1 tablet (600 mg total) by mouth every 8 (eight) hours as needed. 15 tablet 0   . megestrol (MEGACE) 40 MG tablet Take 1 tablet (40 mg total) by mouth daily. 20 tablet 0    Results for orders placed or performed during the hospital encounter of 05/31/16 (from the past 48 hour(s))  Urinalysis, Routine w reflex microscopic (not at [facility])     Status: Abnormal   Collection Time: 05/31/16  8:52 PM  Result Value Ref Range    Color, Urine RED (A) YELLOW    Comment: BIOCHEMICALS MAY BE AFFECTED BY COLOR   APPearance TURBID (A) CLEAR   Specific Gravity, Urine 1.020 1.005 - 1.030   pH 5.0 5.0 - 8.0   Glucose, UA NEGATIVE NEGATIVE mg/dL   Hgb urine dipstick LARGE (A) NEGATIVE   Bilirubin Urine NEGATIVE NEGATIVE   Ketones, ur NEGATIVE NEGATIVE mg/dL   Protein, ur 782 (A) NEGATIVE mg/dL   Nitrite POSITIVE (A) NEGATIVE   Leukocytes, UA TRACE (A) NEGATIVE  Urine microscopic-add on     Status: Abnormal   Collection Time: 05/31/16  8:52 PM  Result Value Ref Range   Squamous Epithelial / LPF 0-5 (A) NONE SEEN   WBC, UA 6-30 0 - 5 WBC/hpf   RBC / HPF TOO NUMEROUS TO COUNT 0 - 5 RBC/hpf   Bacteria, UA FEW (A) NONE SEEN   Urine-Other MUCOUS PRESENT   Pregnancy, urine POC     Status: None   Collection Time: 05/31/16  9:09 PM  Result Value Ref Range   Preg Test, Ur NEGATIVE NEGATIVE    Comment:        THE SENSITIVITY OF THIS METHODOLOGY IS >24 mIU/mL  CBC     Status: None   Collection Time: 05/31/16  9:23 PM  Result Value Ref Range   WBC 6.6 4.0 - 10.5 K/uL   RBC 4.54 3.87 - 5.11 MIL/uL   Hemoglobin 12.2 12.0 - 15.0 g/dL   HCT 40.936.0 81.136.0 - 91.446.0 %   MCV 79.3 78.0 - 100.0 fL   MCH 26.9 26.0 - 34.0 pg   MCHC 33.9 30.0 - 36.0 g/dL   RDW 78.212.9 95.611.5 - 21.315.5 %   Platelets 231 150 - 400 K/uL  Wet prep, genital     Status: Abnormal   Collection Time: 05/31/16 10:50 PM  Result Value Ref Range   Yeast Wet Prep HPF POC NONE SEEN NONE SEEN   Trich, Wet Prep NONE SEEN NONE SEEN   Clue Cells Wet Prep HPF POC NONE SEEN NONE SEEN   WBC, Wet Prep HPF POC FEW (A) NONE SEEN    Comment: MODERATE BACTERIA SEEN   Sperm NONE SEEN     Review of Systems  Constitutional: Negative for fever.  Gastrointestinal: Positive for abdominal pain.  Genitourinary: Positive for frequency. Negative for dysuria, flank pain and urgency.  Musculoskeletal: Positive for back pain.  Neurological: Negative for dizziness.   Physical Exam    Blood pressure 125/71, pulse 71, temperature 98.2 F (36.8 C), temperature source Oral, resp. rate 18, height 5\' 4"  (1.626 m), weight 224 lb 1.9 oz (101.7 kg), SpO2 100 %.  Physical Exam  Constitutional: She is oriented to person, place, and time. She appears well-developed and well-nourished. No distress.  HENT:  Head: Normocephalic.  Eyes: Pupils are equal, round, and reactive to light.  Respiratory: Effort normal.  GI: There is generalized tenderness. There is no rigidity, no rebound and no guarding.  Genitourinary:  Genitourinary Comments: Vagina - Small amount of bright red blood in the vaginal canal, no odor  Cervix - No contact bleeding, no active bleeding  Bimanual exam: Cervix closed Uterus non tender, normal size Adnexa non tender, no masses bilaterally Wet prep done Chaperone present for exam.   Musculoskeletal: Normal range of motion.  Neurological: She is alert and oriented to person, place, and time.  Skin: Skin is warm. She is not diaphoretic.  Psychiatric: Her behavior is normal.    MAU Course  Procedures  None  MDM  Tramadol 2 tabs Ibuprofen 600 mg Wet prep  Provera 10 mg  Pain down from a 5/10 from 8/10  Assessment and Plan   A:  1. Acute UTI   2. Abnormal vaginal bleeding     P:  Discharge home in stable condition Bleeding precautions Follow up with [facility] as needed, if symptoms persist Rx: Provera, tramadol, bactrim    Duane LopeJennifer I Nejla Reasor, NP 06/01/2016 6:35 AM

## 2016-06-09 ENCOUNTER — Inpatient Hospital Stay (HOSPITAL_COMMUNITY)
Admission: AD | Admit: 2016-06-09 | Discharge: 2016-06-10 | Disposition: A | Discharge status: Home / Self Care | Payer: Medicaid Other | Source: Ambulatory Visit | Attending: Family Medicine | Admitting: Family Medicine

## 2016-06-09 DIAGNOSIS — R102 Pelvic and perineal pain: Secondary | ICD-10-CM | POA: Diagnosis not present

## 2016-06-09 DIAGNOSIS — F17210 Nicotine dependence, cigarettes, uncomplicated: Secondary | ICD-10-CM | POA: Insufficient documentation

## 2016-06-09 DIAGNOSIS — D500 Iron deficiency anemia secondary to blood loss (chronic): Secondary | ICD-10-CM | POA: Diagnosis not present

## 2016-06-09 DIAGNOSIS — N939 Abnormal uterine and vaginal bleeding, unspecified: Secondary | ICD-10-CM | POA: Diagnosis not present

## 2016-06-09 DIAGNOSIS — D649 Anemia, unspecified: Secondary | ICD-10-CM

## 2016-06-09 LAB — CBC
HCT: 27.5 % — ABNORMAL LOW (ref 36.0–46.0)
Hemoglobin: 9.5 g/dL — ABNORMAL LOW (ref 12.0–15.0)
MCH: 27.4 pg (ref 26.0–34.0)
MCHC: 34.5 g/dL (ref 30.0–36.0)
MCV: 79.3 fL (ref 78.0–100.0)
PLATELETS: 229 10*3/uL (ref 150–400)
RBC: 3.47 MIL/uL — AB (ref 3.87–5.11)
RDW: 13.4 % (ref 11.5–15.5)
WBC: 8.2 10*3/uL (ref 4.0–10.5)

## 2016-06-09 LAB — URINALYSIS, ROUTINE W REFLEX MICROSCOPIC
BILIRUBIN URINE: NEGATIVE
Glucose, UA: NEGATIVE mg/dL
KETONES UR: NEGATIVE mg/dL
LEUKOCYTES UA: NEGATIVE
NITRITE: NEGATIVE
PROTEIN: NEGATIVE mg/dL
Specific Gravity, Urine: 1.02 (ref 1.005–1.030)
pH: 5.5 (ref 5.0–8.0)

## 2016-06-09 LAB — URINE MICROSCOPIC-ADD ON

## 2016-06-09 LAB — POCT PREGNANCY, URINE: Preg Test, Ur: NEGATIVE

## 2016-06-09 NOTE — MAU Note (Signed)
Pt states she was seen here on 09/17, for same complaint and was given Rx for meds for bleeding and for pain and meds are not helping.

## 2016-06-10 ENCOUNTER — Inpatient Hospital Stay (HOSPITAL_COMMUNITY): Payer: Medicaid Other

## 2016-06-10 ENCOUNTER — Encounter (HOSPITAL_COMMUNITY): Payer: Self-pay

## 2016-06-10 DIAGNOSIS — D500 Iron deficiency anemia secondary to blood loss (chronic): Secondary | ICD-10-CM

## 2016-06-10 DIAGNOSIS — N939 Abnormal uterine and vaginal bleeding, unspecified: Secondary | ICD-10-CM | POA: Diagnosis not present

## 2016-06-10 MED ORDER — NORGESTIMATE-ETH ESTRADIOL 0.25-35 MG-MCG PO TABS: 1.0000 | ORAL_TABLET | Freq: Every day | ORAL | 11 refills | Status: DC

## 2016-06-10 MED ORDER — IBUPROFEN 800 MG PO TABS
800.0000 mg | ORAL_TABLET | Freq: Once | ORAL | Status: AC
  Administered 2016-06-10: 800 mg via ORAL
  Filled 2016-06-10: qty 1

## 2016-06-10 MED ORDER — FERROUS SULFATE 325 (65 FE) MG PO TABS: 325.0000 mg | ORAL_TABLET | Freq: Two times a day (BID) | ORAL | 1 refills | Status: DC

## 2016-06-10 MED ORDER — IBUPROFEN 800 MG PO TABS: 800.0000 mg | ORAL_TABLET | Freq: Three times a day (TID) | ORAL | 0 refills | Status: DC | PRN

## 2016-06-10 NOTE — MAU Provider Note (Signed)
History     CSN: [id number]  Arrival date and time: 06/09/16 2128   First Provider Initiated Contact with Patient 06/10/16 0036      Chief Complaint  Patient presents with  . Vaginal Bleeding   Vaginal Bleeding  The patient's primary symptoms include pelvic pain and vaginal bleeding. This is a new (Patient was given provera when here last. Bleeding did not stop. ) problem. Episode onset: started around 05/12/16. The problem occurs constantly. The problem has been unchanged. Pain severity now: 10/10  The problem affects both sides. She is not pregnant. The vaginal discharge was bloody. The vaginal bleeding is heavier than menses. She has been passing clots. She has not been passing tissue. She uses nothing for contraception. Her menstrual history has been irregular (05/12/16).    Past Medical History:  Diagnosis Date  . Hypertension     Past Surgical History:  Procedure Laterality Date  . NO PAST SURGERIES      Family History  Problem Relation Age of Onset  . Hypertension Mother   . CAD Other   . Diabetes Mellitus II Other     Social History  Substance Use Topics  . Smoking status: Current Every Day Smoker    Types: Cigarettes  . Smokeless tobacco: Never Used  . Alcohol use Yes     Comment: rare    Allergies: No Known Allergies  Prescriptions Prior to Admission  Medication Sig Dispense Refill Last Dose  . ibuprofen (ADVIL,MOTRIN) 600 MG tablet Take 1 tablet (600 mg total) by mouth every 8 (eight) hours as needed. 15 tablet 0   . medroxyPROGESTERone (PROVERA) 10 MG tablet Take 1 tablet (10 mg total) by mouth daily. 14 tablet 0   . traMADol (ULTRAM) 50 MG tablet Take 1-2 tablets (50-100 mg total) by mouth every 6 (six) hours as needed for moderate pain or severe pain. 6 tablet 0     Review of Systems  Genitourinary: Positive for pelvic pain and vaginal bleeding.   Physical Exam   Blood pressure 135/81, pulse 90, temperature 98.1 F (36.7 C), temperature source  Oral, resp. rate 18, height 5\' 3"  (1.6 m), weight 225 lb 4 oz (102.2 kg), SpO2 100 %.  Physical Exam  Nursing note and vitals reviewed. Constitutional: She is oriented to person, place, and time. She appears well-developed and well-nourished. No distress.  HENT:  Head: Normocephalic.  Cardiovascular: Normal rate.   Respiratory: Effort normal.  GI: Soft. There is no tenderness. There is no rebound.  Genitourinary:  Genitourinary Comments: External: no lesion Vagina: small amount of blood  Cervix: pink, smooth, no CMT Uterus: NSSC Adnexa: NT   Neurological: She is alert and oriented to person, place, and time.  Skin: Skin is warm and dry.  Psychiatric: She has a normal mood and affect.     Results for orders placed or performed during the hospital encounter of 06/09/16 (from the past 24 hour(s))  Urinalysis, [facility]ine w reflex microscopic [facility])     Status: Abnormal   Collection Time: 06/09/16 10:02 PM  Result Value Ref Range   Color, Urine YELLOW YELLOW   APPearance CLEAR CLEAR   Specific Gravity, Urine 1.020 1.005 - 1.030   pH 5.5 5.0 - 8.0   Glucose, UA NEGATIVE NEGATIVE mg/dL   Hgb urine dipstick LARGE (A) NEGATIVE   Bilirubin Urine NEGATIVE NEGATIVE   Ketones, ur NEGATIVE NEGATIVE mg/dL   Protein, ur NEGATIVE NEGATIVE mg/dL   Nitrite NEGATIVE NEGATIVE   Leukocytes, UA  NEGATIVE NEGATIVE  Urine microscopic-add on     Status: Abnormal   Collection Time: 06/09/16 10:02 PM  Result Value Ref Range   Squamous Epithelial / LPF 0-5 (A) NONE SEEN   WBC, UA 0-5 0 - 5 WBC/hpf   RBC / HPF 6-30 0 - 5 RBC/hpf   Bacteria, UA FEW (A) NONE SEEN  Pregnancy, urine POC     Status: None   Collection Time: 06/09/16 10:34 PM  Result Value Ref Range   Preg Test, Ur NEGATIVE NEGATIVE  CBC     Status: Abnormal   Collection Time: 06/09/16 10:42 PM  Result Value Ref Range   WBC 8.2 4.0 - 10.5 K/uL   RBC 3.47 (L) 3.87 - 5.11 MIL/uL   Hemoglobin 9.5 (L) 12.0 - 15.0 g/dL   HCT 16.1 (L)  09.6 - 46.0 %   MCV 79.3 78.0 - 100.0 fL   MCH 27.4 26.0 - 34.0 pg   MCHC 34.5 30.0 - 36.0 g/dL   RDW 04.5 40.9 - 81.1 %   Platelets 229 150 - 400 K/uL   US Transvaginal Non-ob  Result Date: 06/10/2016 CLINICAL DATA:  Acute onset of abnormal vaginal bleeding. Initial encounter. EXAM: ULTRASOUND PELVIS TRANSVAGINAL TECHNIQUE: Transvaginal ultrasound examination of the pelvis was performed including evaluation of the uterus, ovaries, adnexal regions, and pelvic cul-de-sac. COMPARISON:  Pelvic ultrasound performed 03/20/2016 FINDINGS: Uterus Measurements: 9.2 x 4.4 x 5.6 cm. No fibroids or other mass visualized. Nabothian cysts are noted at the cervix. Endometrium Thickness: 1.7 cm. No focal abnormality visualized. Trace fluid is noted at the lower uterine segment. Right ovary Measurements: 4.2 x 2.3 x 3.3 cm. Normal appearance/no adnexal mass. Left ovary Measurements: 3.2 x 2.4 x 2.7 cm. Normal appearance/no adnexal mass. Limited Doppler evaluation demonstrates normal color Doppler blood flow with regard to both ovaries. Other findings:  No free fluid seen within the pelvic cul-de-sac. IMPRESSION: Somewhat thickened endometrial echo complex and trace fluid at the lower uterine segment may correspond to the patient's vaginal bleeding. No focal mass seen, though if the patient's symptoms persist, further evaluation could be considered as deemed clinically appropriate. Electronically Signed   By: Roanna Raider M.D.   On: 06/10/2016 02:15   MAU Course  Procedures  MDM Patient offered appointment in clinic here. She declines. She states that she would like to see care in a private practice   Assessment and Plan   1. Abnormal uterine bleeding (AUB)   2. Abnormal uterine bleeding   3. Anemia, unspecified anemia type    DC home Comfort measures reviewed  Bleeding precautions RX: Stop provera, start OCPs, ibuprofen PRN for pain, Iron BID  Return to MAU as needed Call for an appointment in private  OBGYN office   Follow-up Information    [facility] .   Specialty:  Obstetrics and Gynecology Contact information: [address] [place] [address] [phone]           Tawnya Crook 06/10/2016, 2:26 AM

## 2016-06-10 NOTE — Discharge Instructions (Signed)

## 2017-08-17 ENCOUNTER — Encounter (HOSPITAL_COMMUNITY): Payer: Self-pay | Admitting: Emergency Medicine

## 2017-08-17 ENCOUNTER — Emergency Department (HOSPITAL_COMMUNITY)
Admission: EM | Admit: 2017-08-17 | Discharge: 2017-08-18 | Disposition: A | Discharge status: Home / Self Care | Payer: Medicaid Other | Attending: Emergency Medicine | Admitting: Emergency Medicine

## 2017-08-17 ENCOUNTER — Other Ambulatory Visit: Payer: Self-pay

## 2017-08-17 DIAGNOSIS — M545 Low back pain: Secondary | ICD-10-CM | POA: Diagnosis present

## 2017-08-17 DIAGNOSIS — Z5321 Procedure and treatment not carried out due to patient leaving prior to being seen by health care provider: Secondary | ICD-10-CM | POA: Insufficient documentation

## 2017-08-17 LAB — CBC WITH DIFFERENTIAL/PLATELET
BASOS PCT: 0 %
Basophils Absolute: 0 10*3/uL (ref 0.0–0.1)
Eosinophils Absolute: 0.1 10*3/uL (ref 0.0–0.7)
Eosinophils Relative: 1 %
HCT: 38.5 % (ref 36.0–46.0)
HEMOGLOBIN: 12.9 g/dL (ref 12.0–15.0)
Lymphocytes Relative: 47 %
Lymphs Abs: 3.6 10*3/uL (ref 0.7–4.0)
MCH: 25.7 pg — ABNORMAL LOW (ref 26.0–34.0)
MCHC: 33.5 g/dL (ref 30.0–36.0)
MCV: 76.8 fL — ABNORMAL LOW (ref 78.0–100.0)
Monocytes Absolute: 0.5 10*3/uL (ref 0.1–1.0)
Monocytes Relative: 6 %
NEUTROS PCT: 46 %
Neutro Abs: 3.5 10*3/uL (ref 1.7–7.7)
Platelets: 233 10*3/uL (ref 150–400)
RBC: 5.01 MIL/uL (ref 3.87–5.11)
RDW: 13.6 % (ref 11.5–15.5)
WBC: 7.7 10*3/uL (ref 4.0–10.5)

## 2017-08-17 LAB — I-STAT CHEM 8, ED
BUN: 7 mg/dL (ref 6–20)
CHLORIDE: 108 mmol/L (ref 101–111)
CREATININE: 0.8 mg/dL (ref 0.44–1.00)
Calcium, Ion: 1.13 mmol/L — ABNORMAL LOW (ref 1.15–1.40)
Glucose, Bld: 93 mg/dL (ref 65–99)
HEMATOCRIT: 40 % (ref 36.0–46.0)
Hemoglobin: 13.6 g/dL (ref 12.0–15.0)
Potassium: 4.1 mmol/L (ref 3.5–5.1)
Sodium: 143 mmol/L (ref 135–145)
TCO2: 26 mmol/L (ref 22–32)

## 2017-08-17 LAB — URINALYSIS, ROUTINE W REFLEX MICROSCOPIC
BILIRUBIN URINE: NEGATIVE
Glucose, UA: NEGATIVE mg/dL
Hgb urine dipstick: NEGATIVE
Ketones, ur: NEGATIVE mg/dL
NITRITE: NEGATIVE
PH: 6 (ref 5.0–8.0)
Protein, ur: NEGATIVE mg/dL
SPECIFIC GRAVITY, URINE: 1.013 (ref 1.005–1.030)

## 2017-08-17 LAB — I-STAT BETA HCG BLOOD, ED (MC, WL, AP ONLY)

## 2017-08-17 NOTE — ED Notes (Signed)
Pt informed staff that they were leaving and I was handed their bracelet and stickers

## 2017-08-17 NOTE — ED Triage Notes (Signed)
Patient reports low back pain after a fall yesterday , denies LOC/ambulatory , no hematuria or fever .

## 2018-06-08 ENCOUNTER — Emergency Department (HOSPITAL_COMMUNITY): Payer: Medicaid Other

## 2018-06-08 ENCOUNTER — Other Ambulatory Visit: Payer: Self-pay

## 2018-06-08 ENCOUNTER — Emergency Department (HOSPITAL_COMMUNITY)
Admission: EM | Admit: 2018-06-08 | Discharge: 2018-06-09 | Disposition: A | Discharge status: Home / Self Care | Payer: Medicaid Other | Attending: Emergency Medicine | Admitting: Emergency Medicine

## 2018-06-08 ENCOUNTER — Encounter (HOSPITAL_COMMUNITY): Payer: Self-pay | Admitting: Emergency Medicine

## 2018-06-08 DIAGNOSIS — F17210 Nicotine dependence, cigarettes, uncomplicated: Secondary | ICD-10-CM | POA: Insufficient documentation

## 2018-06-08 DIAGNOSIS — R0789 Other chest pain: Secondary | ICD-10-CM

## 2018-06-08 DIAGNOSIS — I10 Essential (primary) hypertension: Secondary | ICD-10-CM | POA: Diagnosis not present

## 2018-06-08 DIAGNOSIS — J00 Acute nasopharyngitis [common cold]: Secondary | ICD-10-CM

## 2018-06-08 DIAGNOSIS — R05 Cough: Secondary | ICD-10-CM | POA: Diagnosis present

## 2018-06-08 LAB — CBC
HCT: 40.2 % (ref 36.0–46.0)
Hemoglobin: 13.2 g/dL (ref 12.0–15.0)
MCH: 27.6 pg (ref 26.0–34.0)
MCHC: 32.8 g/dL (ref 30.0–36.0)
MCV: 84.1 fL (ref 78.0–100.0)
PLATELETS: 226 10*3/uL (ref 150–400)
RBC: 4.78 MIL/uL (ref 3.87–5.11)
RDW: 12 % (ref 11.5–15.5)
WBC: 6.1 10*3/uL (ref 4.0–10.5)

## 2018-06-08 LAB — I-STAT BETA HCG BLOOD, ED (MC, WL, AP ONLY): I-stat hCG, quantitative: <5 m[IU]/mL (ref <5)

## 2018-06-08 LAB — BASIC METABOLIC PANEL
ANION GAP: 8 (ref 5–15)
BUN: 9 mg/dL (ref 6–20)
CALCIUM: 9.1 mg/dL (ref 8.9–10.3)
CHLORIDE: 104 mmol/L (ref 98–111)
CO2: 27 mmol/L (ref 22–32)
CREATININE: 0.86 mg/dL (ref 0.44–1.00)
GFR calc Af Amer: >60 mL/min (ref >60)
GFR calc non Af Amer: >60 mL/min (ref >60)
GLUCOSE: 86 mg/dL (ref 70–99)
POTASSIUM: 3.7 mmol/L (ref 3.5–5.1)
Sodium: 139 mmol/L (ref 135–145)

## 2018-06-08 LAB — I-STAT TROPONIN, ED: TROPONIN I, POC: 0 ng/mL (ref 0.00–0.08)

## 2018-06-08 NOTE — ED Triage Notes (Signed)
Pt c/o chest pain that started today that radiates to her back. Also reports nasal congestion and cough. Chest pain constant.

## 2018-06-09 NOTE — ED Notes (Signed)
Reviewed d/c instructions with pt, who verbalized understanding and had no outstanding questions. Pt departed in NAD, refused use of wheelchair.   

## 2018-06-09 NOTE — Discharge Instructions (Signed)
You may take over-the-counter medicine for symptomatic relief, such as Tylenol, Motrin, TheraFlu, Alka seltzer , black elderberry, etc. Please limit acetaminophen (Tylenol) to 4000 mg and Ibuprofen (Motrin, Advil, etc.) to 2400 mg for a 24hr period. Please note that other over-the-counter medicine may contain acetaminophen or ibuprofen as a component of their ingredients.   

## 2018-06-09 NOTE — ED Provider Notes (Signed)
[facility] EMERGENCY DEPARTMENT Provider Note  CSN: [id number] Arrival date & time: 06/08/18 2125  Chief Complaint(s) Cough and Chest Pain  HPI Stephanie Gutierrez is a 27 y.o. female   The history is provided by the patient.  Cough  This is a new problem. The current episode started 2 days ago. The problem occurs every few minutes. The problem has not changed since onset.The cough is non-productive. Associated symptoms include chest pain (only with coughing), chills, rhinorrhea and sore throat. Associated symptoms comments: Nasal congestion. She is a smoker. Her past medical history does not include bronchitis, pneumonia or asthma.    Past Medical History Past Medical History:  Diagnosis Date  . Hypertension    Patient Active Problem List   Diagnosis Date Noted  . Rhabdomyolysis 07/10/2014   Home Medication(s) Prior to Admission medications   Medication Sig Start Date End Date Taking? Authorizing Provider  ferrous sulfate 325 (65 FE) MG tablet Take 1 tablet (325 mg total) by mouth 2 (two) times daily with a meal. Patient not taking: Reported on 06/09/2018 06/10/16   Armando Reichert, CNM  ibuprofen (ADVIL,MOTRIN) 600 MG tablet Take 1 tablet (600 mg total) by mouth every 8 (eight) hours as needed. Patient not taking: Reported on 06/09/2018 04/02/16   Azalia Bilis, MD  ibuprofen (ADVIL,MOTRIN) 800 MG tablet Take 1 tablet (800 mg total) by mouth every 8 (eight) hours as needed. Patient not taking: Reported on 06/09/2018 06/10/16   Armando Reichert, CNM  norgestimate-ethinyl estradiol (SPRINTEC 28) 0.25-35 MG-MCG tablet Take 1 tablet by mouth daily. Patient not taking: Reported on 06/09/2018 06/10/16   Armando Reichert, CNM  traMADol (ULTRAM) 50 MG tablet Take 1-2 tablets (50-100 mg total) by mouth every 6 (six) hours as needed for moderate pain or severe pain. Patient not taking: Reported on 06/09/2018 05/31/16   Rasch, Harolyn Rutherford, NP                                                                                          Past Surgical History Past Surgical History:  Procedure Laterality Date  . NO PAST SURGERIES     Family History Family History  Problem Relation Age of Onset  . Hypertension Mother   . CAD Other   . Diabetes Mellitus II Other     Social History Social History   Tobacco Use  . Smoking status: Current Every Day Smoker    Types: Cigarettes  . Smokeless tobacco: Never Used  Substance Use Topics  . Alcohol use: Yes    Comment: rare  . Drug use: No   Allergies Patient has no known allergies.  Review of Systems Review of Systems  Constitutional: Positive for chills.  HENT: Positive for rhinorrhea and sore throat.   Respiratory: Positive for cough.   Cardiovascular: Positive for chest pain (only with coughing).   All other systems are reviewed and are negative for acute change except as noted in the HPI  Physical Exam Vital Signs  I have reviewed the triage vital signs BP (!) 157/104   Pulse 92   Temp 99 F (37.2 C)   Resp  17   SpO2 98%   Physical Exam  Constitutional: She is oriented to person, place, and time. She appears well-developed and well-nourished. No distress.  HENT:  Head: Normocephalic and atraumatic.  Nose: Nose normal.  Mouth/Throat: No posterior oropharyngeal erythema or tonsillar abscesses. No tonsillar exudate.  Post nasal drip  Eyes: Pupils are equal, round, and reactive to light. Conjunctivae and EOM are normal. Right eye exhibits no discharge. Left eye exhibits no discharge. No scleral icterus.  Neck: Normal range of motion. Neck supple.  Cardiovascular: Normal rate and regular rhythm. Exam reveals no gallop and no friction rub.  No murmur heard. Pulmonary/Chest: Effort normal and breath sounds normal. No stridor. No respiratory distress. She has no rales.  Abdominal: Soft. She exhibits no distension. There is no tenderness.  Musculoskeletal: She exhibits no  edema or tenderness.  Neurological: She is alert and oriented to person, place, and time.  Skin: Skin is warm and dry. No rash noted. She is not diaphoretic. No erythema.  Psychiatric: She has a normal mood and affect.  Vitals reviewed.   ED Results and Treatments Labs (all labs ordered are listed, but only abnormal results are displayed) Labs Reviewed  BASIC METABOLIC PANEL  CBC  I-STAT TROPONIN, ED  I-STAT BETA HCG BLOOD, ED ([facility], [facility], [facility] ONLY)                                                                                                                         EKG  EKG Interpretation  Date/Time:  Tuesday June 08 2018 21:39:35 EDT Ventricular Rate:  99 PR Interval:  164 QRS Duration: 72 QT Interval:  350 QTC Calculation: 449 R Axis:   77 Text Interpretation:  Normal sinus rhythm Abnormal ECG No significant change since last tracing Confirmed by Drema Pry [phone]) on 06/09/2018 1:33:28 AM      Radiology Dg Chest 2 View  Result Date: 06/08/2018 CLINICAL DATA:  Initial evaluation for acute chest pain. EXAM: CHEST - 2 VIEW COMPARISON:  Prior radiograph from 04/01/2016. FINDINGS: The cardiac and mediastinal silhouettes are stable in size and contour, and remain within normal limits. The lungs are normally inflated. No airspace consolidation, pleural effusion, or pulmonary edema is identified. There is no pneumothorax. No acute osseous abnormality identified. IMPRESSION: No active cardiopulmonary disease. Electronically Signed   By: Rise Mu M.D.   On: 06/08/2018 22:43   Pertinent labs & imaging results that were available during my care of the patient were reviewed by me and considered in my medical decision making (see chart for details).  Medications Ordered in ED Medications - No data to display  Procedures Procedures Counseled  patient for approximately 5 minutes regarding smoking cessation. Discussed risks of smoking and how they applied and affected their visit here today. Patient is ready to quit at this time, however will follow up with their primary doctor. Provided with resources.   CPT code: 16109: intermediate counseling for smoking cessation    (including critical care time)  Medical Decision Making / ED Course I have reviewed the nursing notes for this encounter and the patient's prior records (if available in EHR or on provided paperwork).  Patient seen in first look process and screening labs obtain with an EKG was nonischemic reassuring.  Troponin negative.  No leukocytosis or anemia.  No significant electrolyte derangements or renal insufficiency.    27 y.o. female presents with cough, rhinorrhea,  and nasal congestio for 2 days. adequate oral hydration. Post-tussive chest pains.  Rest of history as above.  Patient appears well. No signs of toxicity. No hypoxia, tachypnea or other signs of respiratory distress. No sign of clinical dehydration. Lung exam Clear. Rest of exam as above.  Chest pain is MSK nature.  Low suspicion for ACS.  Doubt PE.  Not classic for aortic dissection or esophageal perforation.  Chest x-ray without evidence suggestive of pneumonia, pneumothorax, pneumomediastinum.  No abnormal contour of the mediastinum to suggest dissection. No evidence of acute injuries.  No evidence suggestive of pharyngitis, AOM, PNA.   Most consistent with viral upper respiratory infection.  Discussed symptomatic treatment with the patient and they will follow closely with their PCP.   The patient is safe for discharge with strict return precautions.    Final Clinical Impression(s) / ED Diagnoses Final diagnoses:  Acute nasopharyngitis  Chest wall pain   Disposition: Discharge  Condition: Good  I have discussed the results, Dx and Tx plan with the patient who expressed understanding and  agree(s) with the plan. Discharge instructions discussed at great length. The patient was given strict return precautions who verbalized understanding of the instructions. No further questions at time of discharge.    ED Discharge Orders    None       Follow Up: [facility] [address] [place] [address] [phone]  Schedule an appointment as soon as possible for a visit  in 5-7 days, If symptoms do not improve or  worsen      This chart was dictated using voice recognition software.  Despite best efforts to proofread,  errors can occur which can change the documentation meaning.   Nira Conn, MD 06/09/18 Earle Gell

## 2020-01-31 ENCOUNTER — Ambulatory Visit (HOSPITAL_COMMUNITY)
Admission: EM | Admit: 2020-01-31 | Discharge: 2020-01-31 | Disposition: A | Discharge status: Home / Self Care | Payer: Medicaid Other | Attending: Family Medicine | Admitting: Family Medicine

## 2020-01-31 ENCOUNTER — Other Ambulatory Visit: Payer: Self-pay

## 2020-01-31 DIAGNOSIS — R519 Headache, unspecified: Secondary | ICD-10-CM | POA: Diagnosis not present

## 2020-01-31 DIAGNOSIS — R110 Nausea: Secondary | ICD-10-CM

## 2020-01-31 DIAGNOSIS — I10 Essential (primary) hypertension: Secondary | ICD-10-CM

## 2020-01-31 MED ORDER — IBUPROFEN 800 MG PO TABS
800.0000 mg | ORAL_TABLET | Freq: Three times a day (TID) | ORAL | 0 refills | Status: DC
Start: 2020-01-31 — End: 2021-07-07

## 2020-01-31 MED ORDER — ONDANSETRON 4 MG PO TBDP
4.0000 mg | ORAL_TABLET | Freq: Three times a day (TID) | ORAL | 0 refills | Status: DC | PRN
Start: 2020-01-31 — End: 2020-05-06

## 2020-01-31 NOTE — Discharge Instructions (Signed)
Your blood pressure was noted to be elevated during your visit today. If you are currently taking medication for high blood pressure, please ensure you are taking this as directed. If you do not have a history of high blood pressure and your blood pressure remains persistently elevated, you may need to begin taking a medication at some point. You may return here within the next few days to recheck if unable to see your primary care provider or if do not have a one.  BP (!) 164/106 (BP Location: Left Arm)   Pulse 91   Temp 98.4 F (36.9 C) (Oral)   Resp 19   Wt 112.1 kg   SpO2 98%   BMI 43.79 kg/m

## 2020-01-31 NOTE — ED Triage Notes (Signed)
Pt is here with a headache, nausea that started 3 days ago. Pt has taken Tylenol, Advil PM to relieve discomfort.

## 2020-02-01 NOTE — ED Provider Notes (Signed)
Stephanie Gutierrez   [id number] 01/31/20 Arrival Time: 2353  ASSESSMENT & PLAN:  1. Bad headache   2. Nausea   3. Elevated blood pressure reading in office with diagnosis of hypertension     Meds ordered this encounter  Medications  . ibuprofen (ADVIL) 800 MG tablet    Sig: Take 1 tablet (800 mg total) by mouth 3 (three) times daily with meals.    Dispense:  21 tablet    Refill:  0  . ondansetron (ZOFRAN-ODT) 4 MG disintegrating tablet    Sig: Take 1 tablet (4 mg total) by mouth every 8 (eight) hours as needed for nausea or vomiting.    Dispense:  15 tablet    Refill:  0   Normal neurological exam. Afebrile without nuchal rigidity. Discussed. Current presentation and symptoms are consistent with prior headaches and are not consistent with SAH, ICH, meningitis, or temporal arteritis. No indication for neurodiagnostic workup at this time. Discussed.  Recommend: Follow-up Information    [facility].   Specialty: Emergency Medicine Why: If symptoms worsen in any way. Contact information: [address] [id number] [facility] [phone]           Reviewed expectations re: course of current medical issues. Questions answered. Outlined signs and symptoms indicating need for more acute intervention. Patient verbalized understanding. After Visit Summary given.   SUBJECTIVE: History from: Patient Patient is able to give a clear and coherent history.  Stephanie Gutierrez is a 29 y.o. female who presents with complaint of a headache. Onset gradual, 3 d ago. Location: frontal without radiation. History of headaches: similar headaches in the past. Precipitating factors include: none which have been determined. Associated symptoms: Preceding aura: no. Nausea/vomiting: very mild nausea without emesis. Vision changes: no. Increased sensitivity to light and to noises: yes. Fever: no. Sinus  pressure/congestion: no. Extremity weakness: no. Home treatment has included Tylenol without much relief. Current headache has limited normal daily activities; has missed work. Denies dizziness, loss of balance, muscle weakness, numbness of extremities and speech difficulties. No head injury reported. Ambulatory without difficulty. No recent travel.  Increased blood pressure noted today. Reports that she has been treated for hypertension in the past. Not currently treated.  She reports no chest pain on exertion, no dyspnea on exertion, no swelling of ankles, no orthostatic dizziness or lightheadedness, no orthopnea or paroxysmal nocturnal dyspnea and no palpitations.    OBJECTIVE:  Vitals:   01/31/20 1734 01/31/20 1738  BP: (!) 164/106   Pulse: 91   Resp: 19   Temp: 98.4 F (36.9 C)   TempSrc: Oral   SpO2: 98%   Weight:  112.1 kg    General appearance: alert; NAD but appears fatigued HENT: normocephalic; atraumatic Eyes: PERRLA; EOMI; conjunctivae normal Neck: supple with FROM Lungs: clear to auscultation bilaterally; unlabored respirations Heart: regular rate and rhythm Extremities: no edema; symmetrical with no gross deformities Skin: warm and dry Neurologic: alert; speech is fluent and clear without dysarthria or aphasia; CN 2-12 grossly intact; no facial droop; normal gait; normal symmetric reflexes; normal extremity strength and sensation throughout; bilateral upper and lower extremity sensation is grossly intact with 5/5 symmetric strength; normal grip strength Psychological: alert and cooperative; normal mood and affect   No Known Allergies  Past Medical History:  Diagnosis Date  . Hypertension    Social History   Socioeconomic History  . Marital status: Single    Spouse name: Not on file  .  Number of children: Not on file  . Years of education: Not on file  . Highest education level: Not on file  Occupational History  . Not on file  Tobacco Use  .  Smoking status: Current Every Day Smoker    Types: Cigarettes  . Smokeless tobacco: Never Used  Substance and Sexual Activity  . Alcohol use: Yes    Comment: rare  . Drug use: No  . Sexual activity: Not on file  Other Topics Concern  . Not on file  Social History Narrative  . Not on file   Social Determinants of Health   Financial Resource Strain:   . Difficulty of Paying Living Expenses:   Food Insecurity:   . Worried About Programme researcher, broadcasting/film/video in the Last Year:   . Barista in the Last Year:   Transportation Needs:   . Freight forwarder (Medical):   Marland Kitchen Lack of Transportation (Non-Medical):   Physical Activity:   . Days of Exercise per Week:   . Minutes of Exercise per Session:   Stress:   . Feeling of Stress :   Social Connections:   . Frequency of Communication with Friends and Family:   . Frequency of Social Gatherings with Friends and Family:   . Attends Religious Services:   . Active Member of Clubs or Organizations:   . Attends Banker Meetings:   Marland Kitchen Marital Status:   Intimate Partner Violence:   . Fear of Current or Ex-Partner:   . Emotionally Abused:   Marland Kitchen Physically Abused:   . Sexually Abused:    Family History  Problem Relation Age of Onset  . Hypertension Mother   . CAD Other   . Diabetes Mellitus II Other    Past Surgical History:  Procedure Laterality Date  . NO PAST SURGERIES       Mardella Layman, MD 02/01/20 [phone]

## 2020-03-15 ENCOUNTER — Other Ambulatory Visit: Payer: Self-pay

## 2020-03-15 ENCOUNTER — Emergency Department (HOSPITAL_COMMUNITY)
Admission: EM | Admit: 2020-03-15 | Discharge: 2020-03-15 | Disposition: A | Discharge status: Home / Self Care | Payer: Medicaid Other | Attending: Emergency Medicine | Admitting: Emergency Medicine

## 2020-03-15 ENCOUNTER — Emergency Department (HOSPITAL_COMMUNITY): Payer: Medicaid Other

## 2020-03-15 ENCOUNTER — Encounter (HOSPITAL_COMMUNITY): Payer: Self-pay | Admitting: Emergency Medicine

## 2020-03-15 DIAGNOSIS — I10 Essential (primary) hypertension: Secondary | ICD-10-CM | POA: Insufficient documentation

## 2020-03-15 DIAGNOSIS — R1011 Right upper quadrant pain: Secondary | ICD-10-CM | POA: Diagnosis present

## 2020-03-15 DIAGNOSIS — I2510 Atherosclerotic heart disease of native coronary artery without angina pectoris: Secondary | ICD-10-CM | POA: Insufficient documentation

## 2020-03-15 DIAGNOSIS — E119 Type 2 diabetes mellitus without complications: Secondary | ICD-10-CM | POA: Diagnosis not present

## 2020-03-15 DIAGNOSIS — F17210 Nicotine dependence, cigarettes, uncomplicated: Secondary | ICD-10-CM | POA: Insufficient documentation

## 2020-03-15 DIAGNOSIS — K8050 Calculus of bile duct without cholangitis or cholecystitis without obstruction: Secondary | ICD-10-CM

## 2020-03-15 DIAGNOSIS — R1031 Right lower quadrant pain: Secondary | ICD-10-CM | POA: Diagnosis not present

## 2020-03-15 DIAGNOSIS — K8020 Calculus of gallbladder without cholecystitis without obstruction: Secondary | ICD-10-CM | POA: Diagnosis not present

## 2020-03-15 DIAGNOSIS — R109 Unspecified abdominal pain: Secondary | ICD-10-CM

## 2020-03-15 LAB — URINALYSIS, ROUTINE W REFLEX MICROSCOPIC
Bilirubin Urine: NEGATIVE
Glucose, UA: NEGATIVE mg/dL
Ketones, ur: NEGATIVE mg/dL
Nitrite: NEGATIVE
Protein, ur: NEGATIVE mg/dL
Specific Gravity, Urine: 1.013 (ref 1.005–1.030)
pH: 5 (ref 5.0–8.0)

## 2020-03-15 LAB — CBC
HCT: 40.2 % (ref 36.0–46.0)
Hemoglobin: 13.5 g/dL (ref 12.0–15.0)
MCH: 28 pg (ref 26.0–34.0)
MCHC: 33.6 g/dL (ref 30.0–36.0)
MCV: 83.4 fL (ref 80.0–100.0)
Platelets: 278 10*3/uL (ref 150–400)
RBC: 4.82 MIL/uL (ref 3.87–5.11)
RDW: 11.9 % (ref 11.5–15.5)
WBC: 6.5 10*3/uL (ref 4.0–10.5)
nRBC: 0 % (ref 0.0–0.2)

## 2020-03-15 LAB — COMPREHENSIVE METABOLIC PANEL
ALT: 28 U/L (ref 0–44)
AST: 20 U/L (ref 15–41)
Albumin: 4 g/dL (ref 3.5–5.0)
Alkaline Phosphatase: 75 U/L (ref 38–126)
Anion gap: 9 (ref 5–15)
BUN: 8 mg/dL (ref 6–20)
CO2: 23 mmol/L (ref 22–32)
Calcium: 8.7 mg/dL — ABNORMAL LOW (ref 8.9–10.3)
Chloride: 104 mmol/L (ref 98–111)
Creatinine, Ser: 0.88 mg/dL (ref 0.44–1.00)
GFR calc Af Amer: >60 mL/min (ref >60)
GFR calc non Af Amer: >60 mL/min (ref >60)
Glucose, Bld: 155 mg/dL — ABNORMAL HIGH (ref 70–99)
Potassium: 3.9 mmol/L (ref 3.5–5.1)
Sodium: 136 mmol/L (ref 135–145)
Total Bilirubin: 0.6 mg/dL (ref 0.3–1.2)
Total Protein: 7.6 g/dL (ref 6.5–8.1)

## 2020-03-15 LAB — LIPASE, BLOOD: Lipase: 21 U/L (ref 11–51)

## 2020-03-15 LAB — I-STAT BETA HCG BLOOD, ED (MC, WL, AP ONLY): I-stat hCG, quantitative: <5 m[IU]/mL (ref <5)

## 2020-03-15 MED ORDER — OXYCODONE HCL 5 MG PO TABS
5.0000 mg | ORAL_TABLET | Freq: Once | ORAL | Status: AC
  Administered 2020-03-15: 5 mg via ORAL
  Filled 2020-03-15: qty 1

## 2020-03-15 MED ORDER — OXYCODONE HCL 5 MG PO TABS: 5.0000 mg | ORAL_TABLET | ORAL | 0 refills | Status: DC | PRN

## 2020-03-15 MED ORDER — NAPROXEN 250 MG PO TABS
500.0000 mg | ORAL_TABLET | Freq: Once | ORAL | Status: AC
  Administered 2020-03-15: 500 mg via ORAL
  Filled 2020-03-15: qty 2

## 2020-03-15 MED ORDER — NAPROXEN 500 MG PO TABS
500.0000 mg | ORAL_TABLET | Freq: Two times a day (BID) | ORAL | 0 refills | Status: DC
Start: 2020-03-15 — End: 2020-04-07

## 2020-03-15 NOTE — Discharge Instructions (Addendum)
You were evaluated in the Emergency Department and after careful evaluation, we did not find any emergent condition requiring admission or further testing in the hospital.  Your exam/testing today was overall reassuring.  Your symptoms seem to be due to gallbladder stones.  Please take the Naprosyn anti-inflammatory medication as directed.  For more significant pain you can use the oxycodone medication.  Please call the general surgery office to schedule an appointment.  For worsening pain or fever, please return to the emergency department.  Please return to the Emergency Department if you experience any worsening of your condition.  We encourage you to follow up with a primary care provider.  Thank you for allowing [place] to be a part of your care.

## 2020-03-15 NOTE — ED Notes (Signed)
Discharge instructions reviewed with patient. Patient verbalizes understanding and is in agreement with plans. All questions answered and medications reviewed. Patient AaOx4, ambulatory, and left with friend.

## 2020-03-15 NOTE — ED Provider Notes (Signed)
[facility]EM[facility]Y DEPT [facility] Emergency Department Provider Note MRN:  [id number]  Arrival date & time: 03/15/20     Chief Complaint   Abdominal Pain   History of Present Illness   Stephanie Gutierrez is a 29 y.o. year-old female with a history of hypertension presenting to the ED with chief complaint of abdominal pain.  Location: Right upper quadrant with occasional radiation to the right flank Duration: 2 or 3 days Onset: Sudden Timing: Constant Description: Aching pain, cannot get comfortable Severity: Moderate to severe, currently 12 out of 10 Exacerbating/Alleviating Factors: None Associated Symptoms: Occasional nausea Pertinent Negatives: Denies vomiting, no diarrhea, no chest pain, no shortness of breath, no fever, no chills.   Review of Systems  A complete 10 system review of systems was obtained and all systems are negative except as noted in the HPI and PMH.   Patient's Health History    Past Medical History:  Diagnosis Date  . Hypertension     Past Surgical History:  Procedure Laterality Date  . NO PAST SURGERIES      Family History  Problem Relation Age of Onset  . Hypertension Mother   . CAD Other   . Diabetes Mellitus II Other     Social History   Socioeconomic History  . Marital status: Single    Spouse name: Not on file  . Number of children: Not on file  . Years of education: Not on file  . Highest education level: Not on file  Occupational History  . Not on file  Tobacco Use  . Smoking status: Current Every Day Smoker    Types: Cigarettes  . Smokeless tobacco: Never Used  Substance and Sexual Activity  . Alcohol use: Yes    Comment: rare  . Drug use: No  . Sexual activity: Not on file  Other Topics Concern  . Not on file  Social History Narrative  . Not on file   Social Determinants of Health   Financial Resource Strain:   . Difficulty of Paying Living Expenses:   Food Insecurity:   . Worried About Programme researcher, broadcasting/film/video in the  Last Year:   . Barista in the Last Year:   Transportation Needs:   . Freight forwarder (Medical):   Marland Kitchen Lack of Transportation (Non-Medical):   Physical Activity:   . Days of Exercise per Week:   . Minutes of Exercise per Session:   Stress:   . Feeling of Stress :   Social Connections:   . Frequency of Communication with Friends and Family:   . Frequency of Social Gatherings with Friends and Family:   . Attends Religious Services:   . Active Member of Clubs or Organizations:   . Attends Banker Meetings:   Marland Kitchen Marital Status:   Intimate Partner Violence:   . Fear of Current or Ex-Partner:   . Emotionally Abused:   Marland Kitchen Physically Abused:   . Sexually Abused:      Physical Exam   Vitals:   03/15/20 1440 03/15/20 1716  BP: (!) 211/108 (!) 181/100  Pulse: 79 72  Resp: 20 20  Temp: 99.1 F (37.3 C) 99.5 F (37.5 C)  SpO2: 98% 98%    CONSTITUTIONAL: Well-appearing, in mild distress due to pain NEURO:  Alert and oriented x 3, no focal deficits EYES:  eyes equal and reactive ENT/NECK:  no LAD, no JVD CARDIO: Regular rate, well-perfused, normal S1 and S2 PULM:  CTAB no wheezing or rhonchi GI/GU:  normal bowel sounds, non-distended, mild tenderness palpation to the right upper quadrant MSK/SPINE:  No gross deformities, no edema SKIN:  no rash, atraumatic PSYCH:  Appropriate speech and behavior  *Additional and/or pertinent findings included in MDM below  Diagnostic and Interventional Summary    EKG Interpretation  Date/Time:    Ventricular Rate:    PR Interval:    QRS Duration:   QT Interval:    QTC Calculation:   R Axis:     Text Interpretation:        Labs Reviewed  COMPREHENSIVE METABOLIC PANEL - Abnormal; Notable for the following components:      Result Value   Glucose, Bld 155 (*)    Calcium 8.7 (*)    All other components within normal limits  URINALYSIS, ROUTINE W REFLEX MICROSCOPIC - Abnormal; Notable for the following  components:   APPearance HAZY (*)    Hgb urine dipstick SMALL (*)    Leukocytes,Ua SMALL (*)    Bacteria, UA RARE (*)    All other components within normal limits  LIPASE, BLOOD  CBC  I-STAT BETA HCG BLOOD, ED ([facility], [facility], [facility] ONLY)    US Abdomen Limited RUQ  Final Result      Medications  oxyCODONE (Oxy IR/ROXICODONE) immediate release tablet 5 mg (5 mg Oral Given 03/15/20 2026)  naproxen (NAPROSYN) tablet 500 mg (500 mg Oral Given 03/15/20 2026)     Procedures  /  Critical Care Procedures  ED Course and Medical Decision Making  I have reviewed the triage vital signs, the nursing notes, and pertinent available records from the EMR.  Listed above are laboratory and imaging tests that I personally ordered, reviewed, and interpreted and then considered in my medical decision making (see below for details).      History and exam suspicious for biliary pathology, colic versus cholecystitis.  No fever, ultrasound confirming cholelithiasis but no evidence of cholecystitis.  Will provide symptomatic management and reassess.  If can get symptoms under control, would be appropriate for discharge with GEN surge follow-up.  Patient is feeling much better, still with mild pain but manageable.  Appropriate for discharge with general surgery follow-up and pain management, advised to stay away from fatty foods.  Elmer Sow. Pilar Plate, MD [facility] Emergency Medicine [facility] [email or web address]  Final Clinical Impressions(s) / ED Diagnoses     ICD-10-CM   1. Biliary colic  K80.50   2. Abdominal pain  R10.9 US Abdomen Limited RUQ    US Abdomen Limited RUQ  3. Gallstones  K80.20     ED Discharge Orders         Ordered    naproxen (NAPROSYN) 500 MG tablet  2 times daily     Discontinue  Reprint     03/15/20 2201    oxyCODONE (ROXICODONE) 5 MG immediate release tablet  Every 4 hours PRN     Discontinue  Reprint     03/15/20 2201           Discharge Instructions Discussed  with and Provided to Patient:     Discharge Instructions     You were evaluated in the Emergency Department and after careful evaluation, we did not find any emergent condition requiring admission or further testing in the hospital.  Your exam/testing today was overall reassuring.  Your symptoms seem to be due to gallbladder stones.  Please take the Naprosyn anti-inflammatory medication as directed.  For more significant pain you can use the oxycodone medication.  Please call the general surgery office to schedule an appointment.  For worsening pain or fever, please return to the emergency department.  Please return to the Emergency Department if you experience any worsening of your condition.  We encourage you to follow up with a primary care provider.  Thank you for allowing [place] to be a part of your care.        Sabas Sous, MD 03/15/20 2207

## 2020-03-15 NOTE — ED Triage Notes (Signed)
Pt reports RUQ sharp pain that radiates to her back. Had some nausea earlier today. Last BM today. No relief from percocet.

## 2020-03-20 ENCOUNTER — Encounter (HOSPITAL_COMMUNITY): Payer: Self-pay

## 2020-03-20 ENCOUNTER — Emergency Department (HOSPITAL_COMMUNITY)
Admission: EM | Admit: 2020-03-20 | Discharge: 2020-03-21 | Disposition: A | Discharge status: Home / Self Care | Payer: Medicaid Other | Attending: Emergency Medicine | Admitting: Emergency Medicine

## 2020-03-20 DIAGNOSIS — I10 Essential (primary) hypertension: Secondary | ICD-10-CM | POA: Diagnosis not present

## 2020-03-20 DIAGNOSIS — Z79899 Other long term (current) drug therapy: Secondary | ICD-10-CM | POA: Diagnosis not present

## 2020-03-20 DIAGNOSIS — K8050 Calculus of bile duct without cholangitis or cholecystitis without obstruction: Secondary | ICD-10-CM | POA: Insufficient documentation

## 2020-03-20 DIAGNOSIS — R1010 Upper abdominal pain, unspecified: Secondary | ICD-10-CM | POA: Diagnosis present

## 2020-03-20 DIAGNOSIS — F17210 Nicotine dependence, cigarettes, uncomplicated: Secondary | ICD-10-CM | POA: Insufficient documentation

## 2020-03-20 LAB — CBC
HCT: 40 % (ref 36.0–46.0)
Hemoglobin: 13.7 g/dL (ref 12.0–15.0)
MCH: 28.8 pg (ref 26.0–34.0)
MCHC: 34.3 g/dL (ref 30.0–36.0)
MCV: 84.2 fL (ref 80.0–100.0)
Platelets: 272 10*3/uL (ref 150–400)
RBC: 4.75 MIL/uL (ref 3.87–5.11)
RDW: 11.8 % (ref 11.5–15.5)
WBC: 6 10*3/uL (ref 4.0–10.5)
nRBC: 0 % (ref 0.0–0.2)

## 2020-03-20 MED ORDER — SODIUM CHLORIDE 0.9% FLUSH: 3.0000 mL | Freq: Once | INTRAVENOUS | Status: DC

## 2020-03-20 NOTE — ED Triage Notes (Signed)
Pt arrives to ED w/ c/o 10/10 RUQ abdominal pain. Pt seen 03/15/20 and diagnosed w/ gallstones.

## 2020-03-21 ENCOUNTER — Other Ambulatory Visit: Payer: Self-pay

## 2020-03-21 LAB — URINALYSIS, ROUTINE W REFLEX MICROSCOPIC
Bilirubin Urine: NEGATIVE
Glucose, UA: NEGATIVE mg/dL
Hgb urine dipstick: NEGATIVE
Ketones, ur: NEGATIVE mg/dL
Nitrite: NEGATIVE
Protein, ur: NEGATIVE mg/dL
Specific Gravity, Urine: 1.017 (ref 1.005–1.030)
pH: 5 (ref 5.0–8.0)

## 2020-03-21 LAB — LIPASE, BLOOD: Lipase: 19 U/L (ref 11–51)

## 2020-03-21 LAB — I-STAT BETA HCG BLOOD, ED (MC, WL, AP ONLY): I-stat hCG, quantitative: <5 m[IU]/mL (ref <5)

## 2020-03-21 LAB — COMPREHENSIVE METABOLIC PANEL
ALT: 36 U/L (ref 0–44)
AST: 28 U/L (ref 15–41)
Albumin: 3.8 g/dL (ref 3.5–5.0)
Alkaline Phosphatase: 69 U/L (ref 38–126)
Anion gap: 10 (ref 5–15)
BUN: 7 mg/dL (ref 6–20)
CO2: 24 mmol/L (ref 22–32)
Calcium: 8.9 mg/dL (ref 8.9–10.3)
Chloride: 104 mmol/L (ref 98–111)
Creatinine, Ser: 0.74 mg/dL (ref 0.44–1.00)
GFR calc Af Amer: >60 mL/min (ref >60)
GFR calc non Af Amer: >60 mL/min (ref >60)
Glucose, Bld: 116 mg/dL — ABNORMAL HIGH (ref 70–99)
Potassium: 3.7 mmol/L (ref 3.5–5.1)
Sodium: 138 mmol/L (ref 135–145)
Total Bilirubin: 0.6 mg/dL (ref 0.3–1.2)
Total Protein: 6.9 g/dL (ref 6.5–8.1)

## 2020-03-21 MED ORDER — OXYCODONE-ACETAMINOPHEN 5-325 MG PO TABS: 1.0000 | ORAL_TABLET | ORAL | 0 refills | Status: DC | PRN

## 2020-03-21 MED ORDER — OXYCODONE-ACETAMINOPHEN 5-325 MG PO TABS
2.0000 | ORAL_TABLET | Freq: Once | ORAL | Status: AC
  Administered 2020-03-21: 2 via ORAL
  Filled 2020-03-21: qty 2

## 2020-03-21 MED ORDER — IBUPROFEN 400 MG PO TABS
600.0000 mg | ORAL_TABLET | Freq: Once | ORAL | Status: AC
  Administered 2020-03-21: 600 mg via ORAL
  Filled 2020-03-21: qty 1

## 2020-03-21 MED ORDER — PROMETHAZINE HCL 25 MG PO TABS
12.5000 mg | ORAL_TABLET | Freq: Once | ORAL | Status: AC
  Administered 2020-03-21: 12.5 mg via ORAL
  Filled 2020-03-21: qty 1

## 2020-03-21 NOTE — ED Notes (Signed)
Triage nurse  Was notified about pt. bp

## 2020-03-21 NOTE — Discharge Instructions (Addendum)
Keep your appointment with general surgery. Continue to take naproxen and use percocet for break through pain.

## 2020-03-21 NOTE — ED Notes (Signed)
Pt discharge instructions and prescription reviewed with the patient. The patient verbalized understanding of instructions. Pt discharged. 

## 2020-03-23 NOTE — ED Provider Notes (Signed)
[facility] EMERGENCY DEPARTMENT Provider Note   CSN: [id number] Arrival date & time: 03/20/20  2302     History Chief Complaint  Patient presents with  . Abdominal Pain    Stephanie Gutierrez is a 29 y.o. female.  HPI   29 year old female with upper abdominal pain.  She was recently seen in the emergency room and diagnosed with gallstones.  She is prescribed pain medication and also advised to take naproxen.  She did make an appointment to follow-up with general surgery but this is not until next week.  She is out of her narcotic pain medicine and is here because her pain is not been well controlled with the proximal limb wound.  Some mild nausea.  No vomiting.  Pain feels similar to what she has been experiencing.  No fevers or chills.  No yellowing of her eyes or significant change in her urine color.  Past Medical History:  Diagnosis Date  . Hypertension     Patient Active Problem List   Diagnosis Date Noted  . Rhabdomyolysis 07/10/2014    Past Surgical History:  Procedure Laterality Date  . NO PAST SURGERIES       OB History    Gravida  0   Para  0   Term  0   Preterm  0   AB  0   Living  0     SAB  0   TAB  0   Ectopic  0   Multiple  0   Live Births  0           Family History  Problem Relation Age of Onset  . Hypertension Mother   . CAD Other   . Diabetes Mellitus II Other     Social History   Tobacco Use  . Smoking status: Current Every Day Smoker    Types: Cigarettes  . Smokeless tobacco: Never Used  Substance Use Topics  . Alcohol use: Yes    Comment: rare  . Drug use: No    Home Medications Prior to Admission medications   Medication Sig Start Date End Date Taking? Authorizing Provider  ibuprofen (ADVIL) 800 MG tablet Take 1 tablet (800 mg total) by mouth 3 (three) times daily with meals. 01/31/20   Mardella Layman, MD  naproxen (NAPROSYN) 500 MG tablet Take 1 tablet (500 mg total) by mouth 2 (two) times daily.  03/15/20   Sabas Sous, MD  ondansetron (ZOFRAN-ODT) 4 MG disintegrating tablet Take 1 tablet (4 mg total) by mouth every 8 (eight) hours as needed for nausea or vomiting. 01/31/20   Mardella Layman, MD  oxyCODONE-acetaminophen (PERCOCET/ROXICET) 5-325 MG tablet Take 1 tablet by mouth every 4 (four) hours as needed for severe pain. 03/21/20   Raeford Razor, MD  ferrous sulfate 325 (65 FE) MG tablet Take 1 tablet (325 mg total) by mouth 2 (two) times daily with a meal. Patient not taking: Reported on 06/09/2018 06/10/16 01/31/20  Thressa Sheller D, CNM  norgestimate-ethinyl estradiol (SPRINTEC 28) 0.25-35 MG-MCG tablet Take 1 tablet by mouth daily. Patient not taking: Reported on 06/09/2018 06/10/16 01/31/20  Thressa Sheller D, CNM    Allergies    Patient has no known allergies.  Review of Systems   Review of Systems All systems reviewed and negative, other than as noted in HPI.  Physical Exam Updated Vital Signs BP (!) 162/85 (BP Location: Right Arm)   Pulse 74   Temp 98.3 F (36.8 C) (Oral)   Resp  20   SpO2 98%   Physical Exam Vitals and nursing note reviewed.  Constitutional:      General: She is not in acute distress.    Appearance: She is well-developed.  HENT:     Head: Normocephalic and atraumatic.  Eyes:     General:        Right eye: No discharge.        Left eye: No discharge.     Conjunctiva/sclera: Conjunctivae normal.  Cardiovascular:     Rate and Rhythm: Normal rate and regular rhythm.     Heart sounds: Normal heart sounds. No murmur heard.  No friction rub. No gallop.   Pulmonary:     Effort: Pulmonary effort is normal. No respiratory distress.     Breath sounds: Normal breath sounds.  Abdominal:     General: There is no distension.     Palpations: Abdomen is soft.     Tenderness: There is no abdominal tenderness.     Comments: Epigastric and right upper quadrant tenderness without rebound or guarding.  No distention.  Musculoskeletal:        General: No  tenderness.     Cervical back: Neck supple.  Skin:    General: Skin is warm and dry.  Neurological:     Mental Status: She is alert.  Psychiatric:        Behavior: Behavior normal.        Thought Content: Thought content normal.     ED Results / Procedures / Treatments   Labs (all labs ordered are listed, but only abnormal results are displayed) Labs Reviewed  COMPREHENSIVE METABOLIC PANEL - Abnormal; Notable for the following components:      Result Value   Glucose, Bld 116 (*)    All other components within normal limits  URINALYSIS, ROUTINE W REFLEX MICROSCOPIC - Abnormal; Notable for the following components:   APPearance HAZY (*)    Leukocytes,Ua TRACE (*)    Bacteria, UA RARE (*)    All other components within normal limits  LIPASE, BLOOD  CBC  I-STAT BETA HCG BLOOD, ED ([facility], [facility], [facility] ONLY)    EKG None  Radiology No results found.  Procedures Procedures (including critical care time)  Medications Ordered in ED Medications  oxyCODONE-acetaminophen (PERCOCET/ROXICET) 5-325 MG per tablet 2 tablet (2 tablets Oral Given 03/21/20 0908)  promethazine (PHENERGAN) tablet 12.5 mg (12.5 mg Oral Given 03/21/20 0908)  ibuprofen (ADVIL) tablet 600 mg (600 mg Oral Given 03/21/20 0907)    ED Course  I have reviewed the triage vital signs and the nursing notes.  Pertinent labs & imaging results that were available during my care of the patient were reviewed by me and considered in my medical decision making (see chart for details).    MDM Rules/Calculators/A&P                          29 year old female with biliary colic.  LFTs and lipase today are fine.  She is afebrile.  No significant change in symptoms since last evaluated but out of pain medication.  She has been following her discharge instruction and and has made an appointment with general surgery although this is not until next week.  I think is reasonable to provide her with another prescription for pain medicine to tide  her over.  Emergent return precautions were discussed.  Final Clinical Impression(s) / ED Diagnoses Final diagnoses:  Biliary colic    Rx /  DC Orders ED Discharge Orders         Ordered    oxyCODONE-acetaminophen (PERCOCET/ROXICET) 5-325 MG tablet  Every 4 hours PRN     Discontinue  Reprint     03/21/20 1012           Raeford Razor, MD 03/23/20 [phone]

## 2020-04-01 ENCOUNTER — Ambulatory Visit: Payer: Self-pay | Admitting: General Surgery

## 2020-04-01 NOTE — H&P (Signed)
Stephanie Gutierrez Appointment: 03/29/2020 4:00 PM Location: [facility] Patient #: [id number] DOB: Feb 11, 1991 Single / Language: [name] / Race: Black or African American Female  History of Present Illness Stephanie Areola Gutierrez. Maddox Bratcher MD; 04/01/2020 3:28 PM) The patient is a 29 year old female who presents for evaluation of gall stones. she is referred by dr Pilar Plate in the ED for evaluation of gallbladder problems. She initially presented to the emergency room on July 1 with complaints of several hours of pain on her right side in her upper abdomen. She had nausea it radiated to the right side. It was not getting better so she went to the emergency room. She underwent an ultrasound which I reviewed which showed fatty liver as well as a gallstone. Her labs were unremarkable and she was discharged home with referral to our office. She represented again to the ER on July 6 with similar symptoms. She is accompanied by her mother today. She states that she has tried to modify her diet and as such she has not really had as severe symptoms as the 2 episodes that prompted her to go to the emergency room. She has a bowel movement daily. She states that she might of had a similar episode 1 year ago but it was not nearly as bad as the episode at the beginning of the month. She states that she is sickle cell trait but otherwise no medical problems. She does smoke. A pack last 2 to 3 days. No prior abdominal surgeries. No fever or chills. No acholic stools.   Problem List/Past Medical Stephanie Areola Gutierrez. Andrey Campanile, MD; 04/01/2020 3:29 PM) SYMPTOMATIC CHOLELITHIASIS (K80.20) SEVERE OBESITY (E66.01)  Past Surgical History Stephanie Areola Gutierrez. Andrey Campanile, MD; 03/29/2020 4:46 PM) No pertinent past surgical history  Allergies (Stephanie Gutierrez, RMA; 03/29/2020 4:20 PM) No Known Drug Allergies [03/29/2020]:  Medication History (Stephanie Gutierrez, RMA; 03/29/2020 4:21 PM) oxyCODONE-Acetaminophen (5-325MG  Tablet, Oral) Active. Naproxen  (500MG  Tablet, Oral) Active.  Pregnancy / Birth History Gutierrez. M, MD; 03/29/2020 4:46 PM) Age at menarche 9 years. Irregular periods  Other Problems 03/31/2020 Gutierrez. M, MD; 04/01/2020 3:29 PM) Anxiety Disorder Cholelithiasis     Review of Systems 04/03/2020 Gutierrez. Stephanie Hauck MD; 03/29/2020 4:46 PM) General Not Present- Appetite Loss, Chills, Fatigue, Fever, Night Sweats, Weight Gain and Weight Loss. Skin Not Present- Change in Wart/Mole, Dryness, Hives, Jaundice, New Lesions, Non-Healing Wounds, Rash and Ulcer. HEENT Not Present- Earache, Hearing Loss, Hoarseness, Nose Bleed, Oral Ulcers, Ringing in the Ears, Seasonal Allergies, Sinus Pain, Sore Throat, Visual Disturbances, Wears glasses/contact lenses and Yellow Eyes. Respiratory Not Present- Bloody sputum, Chronic Cough, Difficulty Breathing, Snoring and Wheezing. Breast Not Present- Breast Mass, Breast Pain, Nipple Discharge and Skin Changes. Cardiovascular Present- Shortness of Breath. Not Present- Chest Pain, Difficulty Breathing Lying Down, Leg Cramps, Palpitations, Rapid Heart Rate and Swelling of Extremities. Gastrointestinal Present- Abdominal Pain. Not Present- Bloating, Bloody Stool, Change in Bowel Habits, Chronic diarrhea, Constipation, Difficulty Swallowing, Excessive gas, Gets full quickly at meals, Hemorrhoids, Indigestion, Nausea, Rectal Pain and Vomiting. Female Genitourinary Not Present- Frequency, Nocturia, Painful Urination, Pelvic Pain and Urgency. Musculoskeletal Present- Back Pain. Not Present- Joint Pain, Joint Stiffness, Muscle Pain, Muscle Weakness and Swelling of Extremities. Neurological Present- Headaches. Not Present- Decreased Memory, Fainting, Numbness, Seizures, Tingling, Tremor, Trouble walking and Weakness. Psychiatric Present- Anxiety. Not Present- Bipolar, Change in Sleep Pattern, Depression, Fearful and Frequent crying.  Vitals 03/31/2020 Gutierrez. Zahrah Sutherlin MD; 04/01/2020 3:25 PM) 03/29/2020 4:21 PM Weight: 242.86 lb  Height: 63in Body Surface  Area: 2.1 Gutierrez Body Mass Index: 43.02 kg/Gutierrez  Temp.: 97.46F  Pulse: 102 (Regular)  BP: 130/84(Sitting, Left Arm, Standard)        Physical Exam Stephanie Areola Gutierrez. Jassiah Viviano MD; 04/01/2020 3:24 PM)  The physical exam findings are as follows: Note:severe obesity  General Mental Status-Alert. General Appearance-Consistent with stated age. Hydration-Well hydrated. Voice-Normal.  Head and Neck Head-normocephalic, atraumatic with no lesions or palpable masses. Trachea-midline. Thyroid Gland Characteristics - normal size and consistency.  Eye Eyeball - Bilateral-Extraocular movements intact. Sclera/Conjunctiva - Bilateral-No scleral icterus.  Chest and Lung Exam Chest and lung exam reveals -quiet, even and easy respiratory effort with no use of accessory muscles and on auscultation, normal breath sounds, no adventitious sounds and normal vocal resonance. Inspection Chest Wall - Normal. Back - normal.  Breast - Did not examine.  Cardiovascular Cardiovascular examination reveals -normal heart sounds, regular rate and rhythm with no murmurs and normal pedal pulses bilaterally.  Abdomen Inspection Inspection of the abdomen reveals - No Hernias. Skin - Scar - no surgical scars. Palpation/Percussion Palpation and Percussion of the abdomen reveal - Soft, Non Tender, No Rebound tenderness, No Rigidity (guarding) and No hepatosplenomegaly. Auscultation Auscultation of the abdomen reveals - Bowel sounds normal.  Peripheral Vascular Upper Extremity Palpation - Pulses bilaterally normal.  Neurologic Neurologic evaluation reveals -alert and oriented x 3 with no impairment of recent or remote memory. Mental Status-Normal.  Neuropsychiatric The patient's mood and affect are described as -normal. Judgment and Insight-insight is appropriate concerning matters relevant to self.  Musculoskeletal Normal Exam - Left-Upper Extremity  Strength Normal and Lower Extremity Strength Normal. Normal Exam - Right-Upper Extremity Strength Normal and Lower Extremity Strength Normal.  Lymphatic Head & Neck  General Head & Neck Lymphatics: Bilateral - Description - Normal. Axillary - Did not examine. Femoral & Inguinal - Did not examine.    Assessment & Plan Stephanie Areola Gutierrez. Felma Pfefferle MD; 04/01/2020 3:29 PM)  SYMPTOMATIC CHOLELITHIASIS (K80.20) Impression: I believe the patient's symptoms are consistent with gallbladder disease.  We discussed gallbladder disease. The patient was given Agricultural engineer. We discussed non-operative and operative management. We discussed the signs & symptoms of acute cholecystitis  I discussed laparoscopic cholecystectomy with IOC in detail. The patient was given educational material as well as diagrams detailing the procedure. We discussed the risks and benefits of a laparoscopic cholecystectomy including, but not limited to bleeding, infection, injury to surrounding structures such as the intestine or liver, bile leak, retained gallstones, need to convert to an open procedure, prolonged diarrhea, blood clots such as DVT, common bile duct injury, anesthesia risks, and possible need for additional procedures. We discussed the typical post-operative recovery course. I explained that the likelihood of improvement of their symptoms is good.  The patient has elected to proceed with surgery.  This patient encounter took 31 minutes on day of visit to perform the following: take history, perform exam, review outside records, interpret imaging, counsel the patient on their diagnosis  Current Plans Pt Education - Pamphlet Given - Laparoscopic Gallbladder Surgery: discussed with patient and provided information. You are being scheduled for surgery- Our schedulers will call you.  You should hear from our office's scheduling department within 5 working days about the location, date, and time of surgery. We try to  make accommodations for patient's preferences in scheduling surgery, but sometimes the OR schedule or the surgeon's schedule prevents [place] from making those accommodations.  If you have not heard from our office [phone]) in 5 working days, call the  office and ask for your surgeon's nurse.  If you have other questions about your diagnosis, plan, or surgery, call the office and ask for your surgeon's nurse.   SEVERE OBESITY (E66.01)  Mary Sella. Andrey Campanile, MD, FACS General, Bariatric, & Minimally Invasive Surgery [facility], [place]

## 2020-04-07 ENCOUNTER — Other Ambulatory Visit: Payer: Self-pay

## 2020-04-07 ENCOUNTER — Ambulatory Visit (HOSPITAL_COMMUNITY)
Admission: EM | Admit: 2020-04-07 | Discharge: 2020-04-07 | Disposition: A | Discharge status: Home / Self Care | Payer: Medicaid Other | Attending: Emergency Medicine | Admitting: Emergency Medicine

## 2020-04-07 ENCOUNTER — Encounter (HOSPITAL_COMMUNITY): Payer: Self-pay | Admitting: *Deleted

## 2020-04-07 DIAGNOSIS — K8050 Calculus of bile duct without cholangitis or cholecystitis without obstruction: Secondary | ICD-10-CM | POA: Insufficient documentation

## 2020-04-07 HISTORY — DX: Calculus of gallbladder without cholecystitis without obstruction: K80.20

## 2020-04-07 LAB — CBC
HCT: 39.6 % (ref 36.0–46.0)
Hemoglobin: 13.3 g/dL (ref 12.0–15.0)
MCH: 27.9 pg (ref 26.0–34.0)
MCHC: 33.6 g/dL (ref 30.0–36.0)
MCV: 83.2 fL (ref 80.0–100.0)
Platelets: 277 10*3/uL (ref 150–400)
RBC: 4.76 MIL/uL (ref 3.87–5.11)
RDW: 11.9 % (ref 11.5–15.5)
WBC: 8.7 10*3/uL (ref 4.0–10.5)
nRBC: 0 % (ref 0.0–0.2)

## 2020-04-07 LAB — COMPREHENSIVE METABOLIC PANEL
ALT: 31 U/L (ref 0–44)
AST: 19 U/L (ref 15–41)
Albumin: 3.7 g/dL (ref 3.5–5.0)
Alkaline Phosphatase: 78 U/L (ref 38–126)
Anion gap: 9 (ref 5–15)
BUN: 6 mg/dL (ref 6–20)
CO2: 27 mmol/L (ref 22–32)
Calcium: 9 mg/dL (ref 8.9–10.3)
Chloride: 102 mmol/L (ref 98–111)
Creatinine, Ser: 0.76 mg/dL (ref 0.44–1.00)
GFR calc Af Amer: >60 mL/min (ref >60)
GFR calc non Af Amer: >60 mL/min (ref >60)
Glucose, Bld: 88 mg/dL (ref 70–99)
Potassium: 3.8 mmol/L (ref 3.5–5.1)
Sodium: 138 mmol/L (ref 135–145)
Total Bilirubin: 0.6 mg/dL (ref 0.3–1.2)
Total Protein: 7.2 g/dL (ref 6.5–8.1)

## 2020-04-07 LAB — LIPASE, BLOOD: Lipase: 22 U/L (ref 11–51)

## 2020-04-07 MED ORDER — OXYCODONE-ACETAMINOPHEN 5-325 MG PO TABS: 1.0000 | ORAL_TABLET | Freq: Four times a day (QID) | ORAL | 0 refills | Status: DC | PRN

## 2020-04-07 MED ORDER — NAPROXEN 500 MG PO TABS
500.0000 mg | ORAL_TABLET | Freq: Two times a day (BID) | ORAL | 0 refills | Status: DC
Start: 2020-04-07 — End: 2020-05-15

## 2020-04-07 NOTE — ED Triage Notes (Signed)
C/O RUQ pain; states ran out of pain med and is about to run out of naproxen from surgeon.  States was told gallstones approx 2-3 wks ago; supposed to have surgery, awaiting surgery date.

## 2020-04-07 NOTE — Discharge Instructions (Signed)
AVOID ANY FATTY FOODS TO PREVENT PAIN.  I will call you if you have any concerning lab findings.  Please follow up with your surgeon on Monday.  If any worsening of symptoms please go to the ER.

## 2020-04-09 NOTE — ED Provider Notes (Signed)
[facility]    CSN: [id number] Arrival date & time: 04/07/20  1719      History   Chief Complaint No chief complaint on file.   HPI Stephanie Gutierrez is a 29 y.o. female.   [name] presents with complaints of upper abdominal pain. Was found to have gallstones after going to the ER 3 weeks ago. She has since followed up with general surgery, on 7/15, and is to have surgery, but has not received call back for surgery date. She hasn't eaten today due to the pain. Last night ate mashed potatoes and gravy as well as popcorn chicken. No fevers. Some nausea, no vomiting. She ran out of naproxen and percocet which had been helping with her pain.    ROS per HPI, negative if not otherwise mentioned.      Past Medical History:  Diagnosis Date   Gallstones    Hypertension     Patient Active Problem List   Diagnosis Date Noted   Rhabdomyolysis 07/10/2014    Past Surgical History:  Procedure Laterality Date   NO PAST SURGERIES      OB History    Gravida  0   Para  0   Term  0   Preterm  0   AB  0   Living  0     SAB  0   TAB  0   Ectopic  0   Multiple  0   Live Births  0            Home Medications    Prior to Admission medications   Medication Sig Start Date End Date Taking? Authorizing Provider  ibuprofen (ADVIL) 800 MG tablet Take 1 tablet (800 mg total) by mouth 3 (three) times daily with meals. 01/31/20   Mardella Layman, MD  naproxen (NAPROSYN) 500 MG tablet Take 1 tablet (500 mg total) by mouth 2 (two) times daily. 04/07/20   Georgetta Haber, NP  ondansetron (ZOFRAN-ODT) 4 MG disintegrating tablet Take 1 tablet (4 mg total) by mouth every 8 (eight) hours as needed for nausea or vomiting. 01/31/20   Mardella Layman, MD  oxyCODONE-acetaminophen (PERCOCET/ROXICET) 5-325 MG tablet Take 1 tablet by mouth every 6 (six) hours as needed for severe pain. 04/07/20   Georgetta Haber, NP  ferrous sulfate 325 (65 FE) MG tablet Take 1 tablet (325 mg  total) by mouth 2 (two) times daily with a meal. Patient not taking: Reported on 06/09/2018 06/10/16 01/31/20  Thressa Sheller D, CNM  norgestimate-ethinyl estradiol (SPRINTEC 28) 0.25-35 MG-MCG tablet Take 1 tablet by mouth daily. Patient not taking: Reported on 06/09/2018 06/10/16 01/31/20  Armando Reichert, CNM    Family History Family History  Problem Relation Age of Onset   Hypertension Mother    CAD Other    Diabetes Mellitus II Other     Social History Social History   Tobacco Use   Smoking status: Current Every Day Smoker    Types: Cigarettes   Smokeless tobacco: Never Used  Vaping Use   Vaping Use: Never used  Substance Use Topics   Alcohol use: Yes    Comment: rare   Drug use: No     Allergies   Patient has no known allergies.   Review of Systems Review of Systems   Physical Exam Triage Vital Signs ED Triage Vitals [04/07/20 1800]  Enc Vitals Group     BP (!) 152/106     Pulse Rate 86  Resp 14     Temp 98.5 F (36.9 C)     Temp Source Oral     SpO2 100 %     Weight      Height      Head Circumference      Peak Flow      Pain Score 10     Pain Loc      Pain Edu?      Excl. in GC?    No data found.  Updated Vital Signs BP (!) 152/106 Comment: Has been having high blood pressure recently   Pulse 86    Temp 98.5 F (36.9 C) (Oral)    Resp 14    LMP 03/26/2020 (Approximate) Comment: "spotting"   SpO2 100%   Visual Acuity Right Eye Distance:   Left Eye Distance:   Bilateral Distance:    Right Eye Near:   Left Eye Near:    Bilateral Near:     Physical Exam Constitutional:      General: She is not in acute distress.    Appearance: She is well-developed.  Cardiovascular:     Rate and Rhythm: Normal rate.  Pulmonary:     Effort: Pulmonary effort is normal.  Abdominal:     Tenderness: There is abdominal tenderness in the right upper quadrant and epigastric area.  Skin:    General: Skin is warm and dry.  Neurological:     Mental  Status: She is alert and oriented to person, place, and time.      UC Treatments / Results  Labs (all labs ordered are listed, but only abnormal results are displayed) Labs Reviewed  CBC  COMPREHENSIVE METABOLIC PANEL  LIPASE, BLOOD    EKG   Radiology No results found.  Procedures Procedures (including critical care time)  Medications Ordered in UC Medications - No data to display  Initial Impression / Assessment and Plan / UC Course  I have reviewed the triage vital signs and the nursing notes.  Pertinent labs & imaging results that were available during my care of the patient were reviewed by me and considered in my medical decision making (see chart for details).     Known gallstones, ate mashed potatoes and gravy and popcorn chicken prior to onset of symptoms. Diet recommendations discussed. Pain management provided, encouraged surgical follow up. Labs repeated to ensure no obvious cholecystitis. Return precautions provided. Patient verbalized understanding and agreeable to plan.   Final Clinical Impressions(s) / UC Diagnoses   Final diagnoses:  Biliary colic     Discharge Instructions     AVOID ANY FATTY FOODS TO PREVENT PAIN.  I will call you if you have any concerning lab findings.  Please follow up with your surgeon on Monday.  If any worsening of symptoms please go to the ER.    ED Prescriptions    Medication Sig Dispense Auth. Provider   naproxen (NAPROSYN) 500 MG tablet Take 1 tablet (500 mg total) by mouth 2 (two) times daily. 30 tablet Linus Mako B, NP   oxyCODONE-acetaminophen (PERCOCET/ROXICET) 5-325 MG tablet  (Status: Discontinued) Take 1 tablet by mouth every 6 (six) hours as needed for severe pain. 15 tablet Linus Mako B, NP   oxyCODONE-acetaminophen (PERCOCET/ROXICET) 5-325 MG tablet  (Status: Discontinued) Take 1 tablet by mouth every 6 (six) hours as needed for severe pain. 15 tablet Linus Mako B, NP   oxyCODONE-acetaminophen  (PERCOCET/ROXICET) 5-325 MG tablet Take 1 tablet by mouth every 6 (six) hours as needed for  severe pain. 15 tablet Linus Mako B, NP     I have reviewed the PDMP during this encounter.   Georgetta Haber, NP 04/09/20 [phone]

## 2020-04-23 ENCOUNTER — Ambulatory Visit (HOSPITAL_COMMUNITY)
Admission: EM | Admit: 2020-04-23 | Discharge: 2020-04-23 | Disposition: A | Discharge status: Home / Self Care | Payer: Medicaid Other | Attending: Family Medicine | Admitting: Family Medicine

## 2020-04-23 ENCOUNTER — Other Ambulatory Visit: Payer: Self-pay

## 2020-04-23 ENCOUNTER — Encounter (HOSPITAL_COMMUNITY): Payer: Self-pay

## 2020-04-23 DIAGNOSIS — R109 Unspecified abdominal pain: Secondary | ICD-10-CM

## 2020-04-23 MED ORDER — POLYETHYLENE GLYCOL 3350 17 G PO PACK: 17.0000 g | PACK | Freq: Every day | ORAL | 0 refills | Status: DC

## 2020-04-23 MED ORDER — FAMOTIDINE 20 MG PO TABS
20.0000 mg | ORAL_TABLET | Freq: Two times a day (BID) | ORAL | 0 refills | Status: DC
Start: 2020-04-23 — End: 2021-07-07

## 2020-04-23 MED ORDER — TRAMADOL HCL 50 MG PO TABS: 50.0000 mg | ORAL_TABLET | Freq: Four times a day (QID) | ORAL | 0 refills | Status: DC | PRN

## 2020-04-23 NOTE — Discharge Instructions (Addendum)
Follow-up with your surgeon Dr. Gaynelle Adu tomorrow regarding your visit here today for abdominal pain.  As we discussed if your symptoms of pain become severe and are unrelieved with the treatment I prescribed here today you are to go immediately to the emergency department.  I encourage you to avoid eating foods that are rich in fat, high in salt, and high in saturated fat content as these will only make your symptoms worse.

## 2020-04-23 NOTE — ED Provider Notes (Addendum)
[facility]    CSN: [id number] Arrival date & time: 04/23/20  1850      History   Chief Complaint Chief Complaint  Patient presents with  . Abdominal Pain    HPI Stephanie Gutierrez is a 29 y.o. female.   HPI Patient with a know history of gallstone and biliary colic who is scheduled for a cholecystectomy in early September, presents for evaluation of abdominal pain. She has had multiple recent ER visits for the same complaint. The abdominal pain is generalized to the right side and epigastric region,  but greater in RUQ. Pain is characterized as sharp and crampy. She was seen here at [facility] on 04/07/20 with the same symptoms and provided a limited quantity of pain medication. She has taken ibuprofen without relief of abdominal pain. The pain is similar to previous of episode of biliary colic. She endorses eating foods high in fat over the last few days which may precipitated these symptoms. No fever, denies diarrhea, or vomiting. Endorses mild nausea. No chest pain, SOB, or dizziness. Refuses ER at present due to wait times. Past Medical History:  Diagnosis Date  . Gallstones   . Hypertension     Patient Active Problem List   Diagnosis Date Noted  . Rhabdomyolysis 07/10/2014    Past Surgical History:  Procedure Laterality Date  . NO PAST SURGERIES      OB History    Gravida  0   Para  0   Term  0   Preterm  0   AB  0   Living  0     SAB  0   TAB  0   Ectopic  0   Multiple  0   Live Births  0            Home Medications    Prior to Admission medications   Medication Sig Start Date End Date Taking? Authorizing Provider  naproxen (NAPROSYN) 500 MG tablet Take 1 tablet (500 mg total) by mouth 2 (two) times daily. 04/07/20  Yes Burky, Dorene Grebe B, NP  ibuprofen (ADVIL) 800 MG tablet Take 1 tablet (800 mg total) by mouth 3 (three) times daily with meals. 01/31/20   Mardella Layman, MD  ondansetron (ZOFRAN-ODT) 4 MG disintegrating tablet Take 1 tablet (4 mg  total) by mouth every 8 (eight) hours as needed for nausea or vomiting. 01/31/20   Mardella Layman, MD  oxyCODONE-acetaminophen (PERCOCET/ROXICET) 5-325 MG tablet Take 1 tablet by mouth every 6 (six) hours as needed for severe pain. 04/07/20   Georgetta Haber, NP  ferrous sulfate 325 (65 FE) MG tablet Take 1 tablet (325 mg total) by mouth 2 (two) times daily with a meal. Patient not taking: Reported on 06/09/2018 06/10/16 01/31/20  Thressa Sheller D, CNM  norgestimate-ethinyl estradiol (SPRINTEC 28) 0.25-35 MG-MCG tablet Take 1 tablet by mouth daily. Patient not taking: Reported on 06/09/2018 06/10/16 01/31/20  Armando Reichert, CNM    Family History Family History  Problem Relation Age of Onset  . Hypertension Mother   . CAD Other   . Diabetes Mellitus II Other     Social History Social History   Tobacco Use  . Smoking status: Current Every Day Smoker    Types: Cigarettes  . Smokeless tobacco: Never Used  Vaping Use  . Vaping Use: Never used  Substance Use Topics  . Alcohol use: Yes    Comment: rare  . Drug use: No     Allergies   Patient  has no known allergies.   Review of Systems Review of Systems Pertinent negatives listed in HPI Physical Exam Triage Vital Signs ED Triage Vitals  Enc Vitals Group     BP 04/23/20 2019 (!) 172/85     Pulse Rate 04/23/20 2019 (!) 16     Resp 04/23/20 2019 16     Temp 04/23/20 2019 98.1 F (36.7 C)     Temp Source 04/23/20 2019 Oral     SpO2 04/23/20 2019 99 %     Weight --      Height --      Head Circumference --      Peak Flow --      Pain Score 04/23/20 2023 10     Pain Loc --      Pain Edu? --      Excl. in GC? --    No data found.  Updated Vital Signs BP (!) 172/85 (BP Location: Left Arm)   Pulse 96 (iniitally entered incorrectly)   Temp 98.1 F (36.7 C) (Oral)   Resp 16   LMP 03/26/2020 (Approximate) Comment: "spotting"  SpO2 99%   Visual Acuity Right Eye Distance:   Left Eye Distance:   Bilateral Distance:     Right Eye Near:   Left Eye Near:    Bilateral Near:     Physical Exam General appearance: alert, well developed, well nourished, cooperative and acute distress present  Head: Normocephalic, without obvious abnormality, atraumatic Respiratory: Respirations even and unlabored, normal respiratory rate Heart: rate and rhythm normal. No gallop or murmurs noted on exam  Abdomen: BS +, + distention,  no rebound tenderness (RUQ or RLQ) or no mass Extremities: No gross deformities Skin: Skin color, texture, turgor normal. No rashes seen  Psych: Appropriate mood and affect. ate.  UC Treatments / Results  Labs (all labs ordered are listed, but only abnormal results are displayed) Labs Reviewed - No data to display  EKG   Radiology No results found.  Procedures Procedures (including critical care time)  Medications Ordered in UC Medications - No data to display  Initial Impression / Assessment and Plan / UC Course  I have reviewed the triage vital signs and the nursing notes.  Pertinent labs & imaging results that were available during my care of the patient were reviewed by me and considered in my medical decision making (see chart for details).      Treating symptoms today, as patient has known gallbladder disease. She has not had normal BM, therefore encouraged increase intake of water and adding Miralax. Famotidine daily for acid indigestion. Short course of tramadol for pain. Follow-up with surgeon in AM. ER if no relief of pain with prescribed medications or if symptoms worsen. An After Visit Summary was printed and given to the patient/family. Precautions discussed. Red flags discussed. Questions invited and answered. They voiced understanding and agreement. Final Clinical Impressions(s) / UC Diagnoses   Final diagnoses:  Right sided abdominal pain     Discharge Instructions     Follow-up with your surgeon Dr. Gaynelle Adu tomorrow regarding your visit here today  for abdominal pain.  As we discussed if your symptoms of pain become severe and are unrelieved with the treatment I prescribed here today you are to go immediately to the emergency department.  I encourage you to avoid eating foods that are rich in fat, high in salt, and high in saturated fat content as these will only make your symptoms worse.    ED  Prescriptions    Medication Sig Dispense Auth. Provider   traMADol (ULTRAM) 50 MG tablet Take 1 tablet (50 mg total) by mouth every 6 (six) hours as needed for moderate pain or severe pain. Patient taking differently:  Take 100 mg by mouth every 6 (six) hours as needed for moderate pain or severe pain.  10 tablet Bing Neighbors, FNP   polyethylene glycol (MIRALAX MIX-IN PAX) 17 g packet Take 17 g by mouth daily. Patient not taking:  Reported on 04/26/2020 14 each Bing Neighbors, FNP   famotidine (PEPCID) 20 MG tablet Take 1 tablet (20 mg total) by mouth 2 (two) times daily. Patient not taking:  Reported on 04/26/2020 30 tablet Bing Neighbors, FNP     I have reviewed the PDMP during this encounter.   Bing Neighbors, FNP 04/28/20 1209    Bing Neighbors, FNP 04/28/20 1212

## 2020-04-23 NOTE — ED Triage Notes (Signed)
Pt RUQ abdominal pain since yesterday, vomiting last night. Denies fever, chills, bloating. Took naproxen 500mg  this morning.

## 2020-04-26 ENCOUNTER — Encounter (HOSPITAL_COMMUNITY): Payer: Self-pay

## 2020-04-26 NOTE — Progress Notes (Signed)
COVID Vaccine Completed: Date COVID Vaccine completed: COVID vaccine manufacturer: Pfizer    Moderna   [employer]   PCP -  Cardiologist -   Chest x-ray - greater than 1 year EKG - 04/30/20 in epic (pre op) Stress Test -  ECHO -  Cardiac Cath -   Sleep Study -  CPAP -   Fasting Blood Sugar -  Checks Blood Sugar _____ times a day  Blood Thinner Instructions: Aspirin Instructions: Last Dose:  Anesthesia review:   Patient denies shortness of breath, fever, cough and chest pain at PAT appointment   Patient verbalized understanding of instructions that were given to them at the PAT appointment. Patient was also instructed that they will need to review over the PAT instructions again at home before surgery.

## 2020-04-26 NOTE — Patient Instructions (Signed)
DUE TO COVID-19 ONLY ONE VISITOR IS ALLOWED TO COME WITH YOU AND STAY IN THE WAITING ROOM ONLY DURING PRE OP AND PROCEDURE.    COVID SWAB TESTING  COMPLETED ON:  Friday, Aug. 13, 2021 at 2:15 PM   [address] [place], [place] [address]  (Must self quarantine after testing. Follow instructions on handout.)       Your procedure is scheduled on: Tuesday, Aug. 17, 2021   Report to [facility] Main  Entrance    Report to admitting at 1:45 PM   Call this number if you have problems the morning of surgery [phone]   Do not eat food :After Midnight.   May have liquids until 12:45 PM  day of surgery  CLEAR LIQUID DIET  Foods Allowed                                                                     Foods Excluded  Water, Black Coffee and tea, regular and decaf                             liquids that you cannot  Plain Jell-O in any flavor  (No red)                                           see through such as: Fruit ices (not with fruit pulp)                                     milk, soups, orange juice              Iced Popsicles (No red)                                    All solid food                                   Apple juices Sports drinks like Gatorade (No red) Lightly seasoned clear broth or consume(fat free) Sugar, honey syrup  Sample Menu Breakfast                                Lunch                                     Supper Cranberry juice                    Beef broth                            Chicken broth Jell-O  Grape juice                           Apple juice Coffee or tea                        Jell-O                                      Popsicle                                                Coffee or tea                        Coffee or tea      Complete one Ensure drink the morning of surgery at 12:45 PM the day of surgery.   Oral Hygiene is also important to reduce your risk of infection.                                     Remember - BRUSH YOUR TEETH THE MORNING OF SURGERY WITH YOUR REGULAR TOOTHPASTE   Do NOT smoke after Midnight   Take these medicines the morning of surgery with A SIP OF WATER: None                               You may not have any metal on your body including hair pins, jewelry, and body piercings             Do not wear make-up, lotions, powders, perfumes/cologne, or deodorant             Do not wear nail polish.  Do not shave  48 hours prior to surgery.           Do not bring valuables to the hospital. [facility] IS NOT             RESPONSIBLE   FOR VALUABLES.   Contacts, dentures or bridgework may not be worn into surgery.    Patients discharged the day of surgery will not be allowed to drive home.   Special Instructions: Bring a copy of your healthcare power of attorney and living will documents         the day of surgery if you haven't scanned them in before.              Please read over the following fact sheets you were given: IF YOU HAVE QUESTIONS ABOUT YOUR PRE OP INSTRUCTIONS PLEASE CALL [phone]   [facility] - Preparing for Surgery Before surgery, you can play an important role.  Because skin is not sterile, your skin needs to be as free of germs as possible.  You can reduce the number of germs on your skin by washing with CHG (chlorahexidine gluconate) soap before surgery.  CHG is an antiseptic cleaner which kills germs and bonds with the skin to continue killing germs even after washing. Please DO NOT use if you have an allergy to CHG or antibacterial soaps.  If your skin becomes reddened/irritated  stop using the CHG and inform your nurse when you arrive at Short Stay. Do not shave (including legs and underarms) for at least 48 hours prior to the first CHG shower.  You may shave your face/neck.  Please follow these instructions carefully:  1.  Shower with CHG Soap the night before surgery and the  morning of surgery.  2.  If you choose to wash your  hair, wash your hair first as usual with your normal  shampoo.  3.  After you shampoo, rinse your hair and body thoroughly to remove the shampoo.                             4.  Use CHG as you would any other liquid soap.  You can apply chg directly to the skin and wash.  Gently with a scrungie or clean washcloth.  5.  Apply the CHG Soap to your body ONLY FROM THE NECK DOWN.   Do   not use on face/ open                           Wound or open sores. Avoid contact with eyes, ears mouth and   genitals (private parts).                       Wash face,  Genitals (private parts) with your normal soap.             6.  Wash thoroughly, paying special attention to the area where your    surgery  will be performed.  7.  Thoroughly rinse your body with warm water from the neck down.  8.  DO NOT shower/wash with your normal soap after using and rinsing off the CHG Soap.                9.  Pat yourself dry with a clean towel.            10.  Wear clean pajamas.            11.  Place clean sheets on your bed the night of your first shower and do not  sleep with pets. Day of Surgery : Do not apply any lotions/deodorants the morning of surgery.  Please wear clean clothes to the hospital/surgery center.  FAILURE TO FOLLOW THESE INSTRUCTIONS MAY RESULT IN THE CANCELLATION OF YOUR SURGERY  PATIENT SIGNATURE_________________________________  NURSE SIGNATURE__________________________________  ________________________________________________________________________

## 2020-04-27 ENCOUNTER — Other Ambulatory Visit (HOSPITAL_COMMUNITY): Payer: Medicaid Other

## 2020-04-28 ENCOUNTER — Inpatient Hospital Stay (HOSPITAL_COMMUNITY): Admission: RE | Admit: 2020-04-28 | Payer: Medicaid Other | Source: Ambulatory Visit

## 2020-04-30 ENCOUNTER — Inpatient Hospital Stay (HOSPITAL_COMMUNITY)
Admission: RE | Admit: 2020-04-30 | Discharge: 2020-04-30 | Disposition: A | Discharge status: Home / Self Care | Payer: Medicaid Other | Source: Ambulatory Visit

## 2020-04-30 HISTORY — DX: Obesity, unspecified: E66.9

## 2020-05-05 ENCOUNTER — Encounter (HOSPITAL_COMMUNITY): Payer: Self-pay | Admitting: *Deleted

## 2020-05-05 ENCOUNTER — Emergency Department (HOSPITAL_COMMUNITY)
Admission: EM | Admit: 2020-05-05 | Discharge: 2020-05-06 | Disposition: A | Discharge status: Home / Self Care | Payer: Medicaid Other | Attending: Emergency Medicine | Admitting: Emergency Medicine

## 2020-05-05 ENCOUNTER — Other Ambulatory Visit: Payer: Self-pay

## 2020-05-05 DIAGNOSIS — K8051 Calculus of bile duct without cholangitis or cholecystitis with obstruction: Secondary | ICD-10-CM | POA: Insufficient documentation

## 2020-05-05 DIAGNOSIS — R1011 Right upper quadrant pain: Secondary | ICD-10-CM | POA: Diagnosis present

## 2020-05-05 DIAGNOSIS — I10 Essential (primary) hypertension: Secondary | ICD-10-CM | POA: Insufficient documentation

## 2020-05-05 DIAGNOSIS — K8050 Calculus of bile duct without cholangitis or cholecystitis without obstruction: Secondary | ICD-10-CM

## 2020-05-05 LAB — URINALYSIS, ROUTINE W REFLEX MICROSCOPIC
Bilirubin Urine: NEGATIVE
Glucose, UA: NEGATIVE mg/dL
Hgb urine dipstick: NEGATIVE
Ketones, ur: NEGATIVE mg/dL
Leukocytes,Ua: NEGATIVE
Nitrite: NEGATIVE
Protein, ur: NEGATIVE mg/dL
Specific Gravity, Urine: 1.012 (ref 1.005–1.030)
pH: 7 (ref 5.0–8.0)

## 2020-05-05 LAB — COMPREHENSIVE METABOLIC PANEL
ALT: 22 U/L (ref 0–44)
AST: 18 U/L (ref 15–41)
Albumin: 3.8 g/dL (ref 3.5–5.0)
Alkaline Phosphatase: 73 U/L (ref 38–126)
Anion gap: 8 (ref 5–15)
BUN: 10 mg/dL (ref 6–20)
CO2: 24 mmol/L (ref 22–32)
Calcium: 8.9 mg/dL (ref 8.9–10.3)
Chloride: 105 mmol/L (ref 98–111)
Creatinine, Ser: 0.91 mg/dL (ref 0.44–1.00)
GFR calc Af Amer: >60 mL/min (ref >60)
GFR calc non Af Amer: >60 mL/min (ref >60)
Glucose, Bld: 125 mg/dL — ABNORMAL HIGH (ref 70–99)
Potassium: 3.8 mmol/L (ref 3.5–5.1)
Sodium: 137 mmol/L (ref 135–145)
Total Bilirubin: 0.5 mg/dL (ref 0.3–1.2)
Total Protein: 7.1 g/dL (ref 6.5–8.1)

## 2020-05-05 LAB — CBC
HCT: 38.9 % (ref 36.0–46.0)
Hemoglobin: 13.1 g/dL (ref 12.0–15.0)
MCH: 28.3 pg (ref 26.0–34.0)
MCHC: 33.7 g/dL (ref 30.0–36.0)
MCV: 84 fL (ref 80.0–100.0)
Platelets: 267 10*3/uL (ref 150–400)
RBC: 4.63 MIL/uL (ref 3.87–5.11)
RDW: 12.1 % (ref 11.5–15.5)
WBC: 7 10*3/uL (ref 4.0–10.5)
nRBC: 0 % (ref 0.0–0.2)

## 2020-05-05 LAB — LIPASE, BLOOD: Lipase: 29 U/L (ref 11–51)

## 2020-05-05 LAB — I-STAT BETA HCG BLOOD, ED (MC, WL, AP ONLY): I-stat hCG, quantitative: <5 m[IU]/mL (ref <5)

## 2020-05-05 NOTE — ED Triage Notes (Signed)
The pt has gallstones and is supposed to have that surgery the 31st of this month more pain since yesterday  lmp irregular

## 2020-05-06 MED ORDER — ONDANSETRON HCL 4 MG/2ML IJ SOLN
4.0000 mg | Freq: Once | INTRAMUSCULAR | Status: AC
  Administered 2020-05-06: 4 mg via INTRAVENOUS
  Filled 2020-05-06: qty 2

## 2020-05-06 MED ORDER — ONDANSETRON HCL 4 MG/2ML IJ SOLN
INTRAMUSCULAR | Status: AC
  Filled 2020-05-06: qty 2

## 2020-05-06 MED ORDER — TRAMADOL HCL 50 MG PO TABS: 50.0000 mg | ORAL_TABLET | Freq: Four times a day (QID) | ORAL | 0 refills | Status: DC | PRN

## 2020-05-06 MED ORDER — ONDANSETRON 4 MG PO TBDP
4.0000 mg | ORAL_TABLET | Freq: Three times a day (TID) | ORAL | 0 refills | Status: DC | PRN
Start: 2020-05-06 — End: 2021-07-07

## 2020-05-06 MED ORDER — MORPHINE SULFATE (PF) 4 MG/ML IV SOLN
4.0000 mg | Freq: Once | INTRAVENOUS | Status: AC
  Administered 2020-05-06: 4 mg via INTRAVENOUS
  Filled 2020-05-06: qty 1

## 2020-05-06 NOTE — Discharge Instructions (Addendum)
Make sure to follow a low-fat diet.  Take medications as prescribed.  Call Dr. Andrey Campanile first thing in the morning.

## 2020-05-06 NOTE — ED Provider Notes (Signed)
[name] EMERGENCY DEPARTMENT Provider Note   CSN: [id number] Arrival date & time: 05/05/20  2109     History Chief Complaint  Patient presents with  . Abdominal Pain  . Cholelithiasis    Stephanie Gutierrez is a 29 y.o. female.  HPI     This a 29 year old female with a history of hypertension, obesity, gallstones who presents with right upper quadrant pain.  Patient reports that she has had more constant right upper quadrant pain over the last 2 to 3 days.  She states it is consistent with the pain that she has had with her gallbladder.  She is supposed to have her gallbladder out on August 31.  She states that she ran out of tramadol several days ago and has had continued pain since that time.  It is worse with eating.  She rates her pain currently at 10 out of 10.  She has not taken anything additional for her pain.  She reports nausea without vomiting.  She has not had any fevers.  I reviewed her chart.  She is due for surgery with Dr. Andrey Campanile later this week.  She had an ultrasound on July 1 that showed gallstones but no evidence of cholecystitis.  Past Medical History:  Diagnosis Date  . Gallstones   . Hypertension   . Obesity     Patient Active Problem List   Diagnosis Date Noted  . Rhabdomyolysis 07/10/2014    Past Surgical History:  Procedure Laterality Date  . NO PAST SURGERIES       OB History    Gravida  0   Para  0   Term  0   Preterm  0   AB  0   Living  0     SAB  0   TAB  0   Ectopic  0   Multiple  0   Live Births  0           Family History  Problem Relation Age of Onset  . Hypertension Mother   . CAD Other   . Diabetes Mellitus II Other     Social History   Tobacco Use  . Smoking status: Current Every Day Smoker    Types: Cigarettes  . Smokeless tobacco: Never Used  Vaping Use  . Vaping Use: Never used  Substance Use Topics  . Alcohol use: Yes    Comment: rare  . Drug use: No    Home  Medications Prior to Admission medications   Medication Sig Start Date End Date Taking? Authorizing Provider  famotidine (PEPCID) 20 MG tablet Take 1 tablet (20 mg total) by mouth 2 (two) times daily. Patient not taking: Reported on 04/26/2020 04/23/20   Bing Neighbors, FNP  ibuprofen (ADVIL) 800 MG tablet Take 1 tablet (800 mg total) by mouth 3 (three) times daily with meals. Patient not taking: Reported on 04/26/2020 01/31/20   Mardella Layman, MD  naproxen (NAPROSYN) 500 MG tablet Take 1 tablet (500 mg total) by mouth 2 (two) times daily. Patient not taking: Reported on 04/26/2020 04/07/20   Linus Mako B, NP  ondansetron (ZOFRAN-ODT) 4 MG disintegrating tablet Take 1 tablet (4 mg total) by mouth every 8 (eight) hours as needed for nausea or vomiting. 05/06/20   Olivene Cookston, Mayer Masker, MD  polyethylene glycol (MIRALAX MIX-IN PAX) 17 g packet Take 17 g by mouth daily. Patient not taking: Reported on 04/26/2020 04/23/20   Bing Neighbors, FNP  traMADol (ULTRAM) 50 MG  tablet Take 1 tablet (50 mg total) by mouth every 6 (six) hours as needed for moderate pain or severe pain. 05/06/20   Toma Erichsen, Mayer Masker, MD  ferrous sulfate 325 (65 FE) MG tablet Take 1 tablet (325 mg total) by mouth 2 (two) times daily with a meal. Patient not taking: Reported on 06/09/2018 06/10/16 01/31/20  Thressa Sheller D, CNM  norgestimate-ethinyl estradiol (SPRINTEC 28) 0.25-35 MG-MCG tablet Take 1 tablet by mouth daily. Patient not taking: Reported on 06/09/2018 06/10/16 01/31/20  Thressa Sheller D, CNM    Allergies    Patient has no known allergies.  Review of Systems   Review of Systems  Constitutional: Negative for fever.  Respiratory: Negative for shortness of breath.   Cardiovascular: Negative for chest pain.  Gastrointestinal: Positive for abdominal pain and nausea. Negative for diarrhea and vomiting.  Genitourinary: Negative for dysuria.  All other systems reviewed and are negative.   Physical Exam Updated Vital  Signs BP 130/68   Pulse 83   Temp 98.7 F (37.1 C) (Oral)   Resp 17   Ht 1.6 m (5\' 3" )   Wt 109.8 kg   SpO2 95%   BMI 42.87 kg/m   Physical Exam Vitals and nursing note reviewed.  Constitutional:      Appearance: She is well-developed. She is obese. She is not ill-appearing.  HENT:     Head: Normocephalic and atraumatic.     Mouth/Throat:     Mouth: Mucous membranes are moist.  Eyes:     Pupils: Pupils are equal, round, and reactive to light.  Cardiovascular:     Rate and Rhythm: Normal rate and regular rhythm.     Heart sounds: Normal heart sounds.  Pulmonary:     Effort: Pulmonary effort is normal. No respiratory distress.     Breath sounds: No wheezing.  Abdominal:     General: Bowel sounds are normal.     Palpations: Abdomen is soft.     Tenderness: There is abdominal tenderness in the right upper quadrant. There is no guarding or rebound. Positive signs include Murphy's sign.  Musculoskeletal:     Cervical back: Neck supple.  Skin:    General: Skin is warm and dry.  Neurological:     Mental Status: She is alert and oriented to person, place, and time.  Psychiatric:        Mood and Affect: Mood normal.     ED Results / Procedures / Treatments   Labs (all labs ordered are listed, but only abnormal results are displayed) Labs Reviewed  COMPREHENSIVE METABOLIC PANEL - Abnormal; Notable for the following components:      Result Value   Glucose, Bld 125 (*)    All other components within normal limits  URINALYSIS, ROUTINE W REFLEX MICROSCOPIC - Abnormal; Notable for the following components:   APPearance HAZY (*)    All other components within normal limits  LIPASE, BLOOD  CBC  I-STAT BETA HCG BLOOD, ED ([facility], [facility], [facility] ONLY)    EKG None  Radiology No results found.  Procedures Procedures (including critical care time)  Medications Ordered in ED Medications  morphine 4 MG/ML injection 4 mg (4 mg Intravenous Given 05/06/20 1400)  ondansetron (ZOFRAN)  injection 4 mg (4 mg Intravenous Given 05/06/20 1354)    ED Course  I have reviewed the triage vital signs and the nursing notes.  Pertinent labs & imaging results that were available during my care of the patient were reviewed by me and considered  in my medical decision making (see chart for details).    MDM Rules/Calculators/A&P                           Patient presents with right upper quadrant pain.  Known cholelithiasis.  She has scheduled cholecystectomy on the 31st.  She is overall nontoxic and vital signs are reassuring.  She is afebrile.  Tender to palpation but no signs of peritonitis.  Lab work reviewed from triage.  No leukocytosis.  LFTs and bilirubin are normal.  Suspect biliary colic.  She is out of her pain medication.  She was given pain and nausea medication.  She is able to orally hydrate without difficulty.  I will prescribe her a short course of ongoing medication.  Narcotic database was reviewed.  She has had several small short courses of narcotic medications within the time period diagnosis in July.  Given that she has definitive treatment later this week, feel is reasonable to prescribe her another short course.  Patient is agreeable to plan.  She will call Dr. Andrey Campanile in the morning.  After history, exam, and medical workup I feel the patient has been appropriately medically screened and is safe for discharge home. Pertinent diagnoses were discussed with the patient. Patient was given return precautions.   Final Clinical Impression(s) / ED Diagnoses Final diagnoses:  Biliary colic    Rx / DC Orders ED Discharge Orders         Ordered    ondansetron (ZOFRAN-ODT) 4 MG disintegrating tablet  Every 8 hours PRN        05/06/20 1451    traMADol (ULTRAM) 50 MG tablet  Every 6 hours PRN        05/06/20 1451           Breland Trouten, Mayer Masker, MD 05/06/20 1525

## 2020-05-06 NOTE — ED Notes (Signed)
Patient verbalizes understanding of discharge instructions. Opportunity for questioning and answers were provided. Armband removed by staff, pt discharged from ED to home with family via POV, A&Ox4, ambulatory

## 2020-05-10 NOTE — Progress Notes (Signed)
DUE TO COVID-19 ONLY ONE VISITOR IS ALLOWED TO COME WITH YOU AND STAY IN THE WAITING ROOM ONLY DURING PRE OP AND PROCEDURE DAY OF SURGERY. THE 1 VISITOR  MAY VISIT WITH YOU AFTER SURGERY IN YOUR PRIVATE ROOM DURING VISITING HOURS ONLY!  YOU NEED TO HAVE A COVID 19 TEST ON_8/27/21______ @_______ , THIS TEST MUST BE DONE BEFORE SURGERY,  COVID TESTING SITE [address] [place] , IT IS ON THE RIGHT GOING OUT [address] APPROXIMATELY  2 MINUTES PAST [employer] ON THE RIGHT. ONCE YOUR COVID TEST IS COMPLETED,  PLEASE BEGIN THE QUARANTINE INSTRUCTIONS AS OUTLINED IN YOUR HANDOUT.                Stephanie Gutierrez  05/10/2020   Your procedure is scheduled on: 05/15/20   Report to [facility] Main  Entrance   Report to admitting at    1130  1030AM AM     Call this number if you have problems the morning of surgery [phone]    REMEMBER: NO  SOLID FOOD CANDY OR GUM AFTER MIDNIGHT. CLEAR LIQUIDS UNTIL  1030AM      . NOTHING BY MOUTH EXCEPT CLEAR LIQUIDS UNTIL    . PLEASE FINISH 1030AMENSURE DRINK PER SURGEON ORDER  WHICH NEEDS TO BE COMPLETED .  AT     1030AM     CLEAR LIQUID DIET   Foods Allowed                                                                    Coffee and tea, regular and decaf                            Fruit ices (not with fruit pulp)                                      Iced Popsicles                                    Carbonated beverages, regular and diet                                    Cranberry, grape and apple juices Sports drinks like Gatorade Lightly seasoned clear broth or consume(fat free) Sugar, honey syrup ___________________________________________________________________      BRUSH YOUR TEETH MORNING OF SURGERY AND RINSE YOUR MOUTH OUT, NO CHEWING GUM CANDY OR MINTS.     Take these medicines the morning of surgery with A SIP OF WATER: NONE  DO NOT TAKE ANY DIABETIC MEDICATIONS DAY OF YOUR SURGERY                                You may not have any metal on your body including hair pins and              piercings  Do not wear jewelry, make-up, lotions, powders  or perfumes, deodorant             Do not wear nail polish on your fingernails.  Do not shave  48 hours prior to surgery.              Men may shave face and neck.   Do not bring valuables to the hospital. [place].  Contacts, dentures or bridgework may not be worn into surgery.  Leave suitcase in the car. After surgery it may be brought to your room.     Patients discharged the day of surgery will not be allowed to drive home. IF YOU ARE HAVING SURGERY AND GOING HOME THE SAME DAY, YOU MUST HAVE AN ADULT TO DRIVE YOU HOME AND BE WITH YOU FOR 24 HOURS. YOU MAY GO HOME BY TAXI OR UBER OR ORTHERWISE, BUT AN ADULT MUST ACCOMPANY YOU HOME AND STAY WITH YOU FOR 24 HOURS.  Name and phone number of your driver:  Special Instructions: N/A              Please read over the following fact sheets you were given: _____________________________________________________________________  [facility] - Preparing for Surgery Before surgery, you can play an important role.  Because skin is not sterile, your skin needs to be as free of germs as possible.  You can reduce the number of germs on your skin by washing with CHG (chlorahexidine gluconate) soap before surgery.  CHG is an antiseptic cleaner which kills germs and bonds with the skin to continue killing germs even after washing. Please DO NOT use if you have an allergy to CHG or antibacterial soaps.  If your skin becomes reddened/irritated stop using the CHG and inform your nurse when you arrive at Short Stay. Do not shave (including legs and underarms) for at least 48 hours prior to the first CHG shower.  You may shave your face/neck. Please follow these instructions carefully:  1.  Shower with CHG Soap the night before surgery and the  morning of  Surgery.  2.  If you choose to wash your hair, wash your hair first as usual with your  normal  shampoo.  3.  After you shampoo, rinse your hair and body thoroughly to remove the  shampoo.                           4.  Use CHG as you would any other liquid soap.  You can apply chg directly  to the skin and wash                       Gently with a scrungie or clean washcloth.  5.  Apply the CHG Soap to your body ONLY FROM THE NECK DOWN.   Do not use on face/ open                           Wound or open sores. Avoid contact with eyes, ears mouth and genitals (private parts).                       Wash face,  Genitals (private parts) with your normal soap.             6.  Wash thoroughly, paying special attention to the area  where your surgery  will be performed.  7.  Thoroughly rinse your body with warm water from the neck down.  8.  DO NOT shower/wash with your normal soap after using and rinsing off  the CHG Soap.                9.  Pat yourself dry with a clean towel.            10.  Wear clean pajamas.            11.  Place clean sheets on your bed the night of your first shower and do not  sleep with pets. Day of Surgery : Do not apply any lotions/deodorants the morning of surgery.  Please wear clean clothes to the hospital/surgery center.  FAILURE TO FOLLOW THESE INSTRUCTIONS MAY RESULT IN THE CANCELLATION OF YOUR SURGERY PATIENT SIGNATURE_________________________________  NURSE SIGNATURE__________________________________  ________________________________________________________________________

## 2020-05-11 ENCOUNTER — Encounter (HOSPITAL_COMMUNITY): Payer: Self-pay

## 2020-05-11 ENCOUNTER — Encounter (HOSPITAL_COMMUNITY)
Admission: RE | Admit: 2020-05-11 | Discharge: 2020-05-11 | Disposition: A | Discharge status: Home / Self Care | Payer: Medicaid Other | Source: Ambulatory Visit | Attending: General Surgery | Admitting: General Surgery

## 2020-05-11 ENCOUNTER — Other Ambulatory Visit: Payer: Self-pay

## 2020-05-11 ENCOUNTER — Other Ambulatory Visit (HOSPITAL_COMMUNITY)
Admission: RE | Admit: 2020-05-11 | Discharge: 2020-05-11 | Disposition: A | Discharge status: Home / Self Care | Payer: Medicaid Other | Source: Ambulatory Visit | Attending: General Surgery | Admitting: General Surgery

## 2020-05-11 DIAGNOSIS — Z20822 Contact with and (suspected) exposure to covid-19: Secondary | ICD-10-CM | POA: Diagnosis not present

## 2020-05-11 DIAGNOSIS — Z01818 Encounter for other preprocedural examination: Secondary | ICD-10-CM | POA: Diagnosis present

## 2020-05-11 HISTORY — DX: Anxiety disorder, unspecified: F41.9

## 2020-05-11 HISTORY — DX: Depression, unspecified: F32.A

## 2020-05-11 LAB — SARS CORONAVIRUS 2 (TAT 6-24 HRS): SARS Coronavirus 2: NEGATIVE

## 2020-05-14 NOTE — Anesthesia Preprocedure Evaluation (Addendum)
Anesthesia Evaluation  Patient identified by MRN, date of birth, ID band Patient awake    Reviewed: Allergy & Precautions, NPO status , Patient's Chart, lab work & pertinent test results  Airway Mallampati: II  TM Distance: >3 FB Neck ROM: Full    Dental no notable dental hx. (+) Teeth Intact, Dental Advisory Given,    Pulmonary Current Smoker,  7 cigg/d x 10 years No inhalers   Pulmonary exam normal breath sounds clear to auscultation       Cardiovascular hypertension, Normal cardiovascular exam Rhythm:Regular Rate:Normal  Has no PCP- BP 168/110   Neuro/Psych PSYCHIATRIC DISORDERS Anxiety Depression negative neurological ROS     GI/Hepatic Neg liver ROS, GERD  Medicated and Controlled,Symptomatic cholelithiasis    Endo/Other  Morbid obesityBMI 44  Renal/GU negative Renal ROS  negative genitourinary   Musculoskeletal negative musculoskeletal ROS (+)   Abdominal (+) + obese,   Peds  Hematology negative hematology ROS (+) hct 38.9   Anesthesia Other Findings   Reproductive/Obstetrics negative OB ROS                            Anesthesia Physical Anesthesia Plan  ASA: III  Anesthesia Plan: General   Post-op Pain Management:    Induction: Intravenous  PONV Risk Score and Plan: 3 and Ondansetron, Dexamethasone, Midazolam and Treatment may vary due to age or medical condition  Airway Management Planned: Oral ETT  Additional Equipment: None  Intra-op Plan:   Post-operative Plan: Extubation in OR  Informed Consent: I have reviewed the patients History and Physical, chart, labs and discussed the procedure including the risks, benefits and alternatives for the proposed anesthesia with the patient or authorized representative who has indicated his/her understanding and acceptance.     Dental advisory given  Plan Discussed with: CRNA  Anesthesia Plan Comments:         Anesthesia Quick Evaluation

## 2020-05-15 ENCOUNTER — Telehealth (HOSPITAL_COMMUNITY): Payer: Self-pay | Admitting: *Deleted

## 2020-05-15 ENCOUNTER — Ambulatory Visit (HOSPITAL_COMMUNITY)
Admission: RE | Admit: 2020-05-15 | Discharge: 2020-05-15 | Disposition: A | Discharge status: Home / Self Care | Payer: Medicaid Other | Source: Ambulatory Visit | Attending: General Surgery | Admitting: General Surgery

## 2020-05-15 ENCOUNTER — Ambulatory Visit (HOSPITAL_COMMUNITY): Payer: Medicaid Other | Admitting: Anesthesiology

## 2020-05-15 ENCOUNTER — Encounter (HOSPITAL_COMMUNITY): Payer: Self-pay | Admitting: General Surgery

## 2020-05-15 ENCOUNTER — Encounter (HOSPITAL_COMMUNITY)
Admission: RE | Disposition: A | Discharge status: Home / Self Care | Payer: Self-pay | Source: Ambulatory Visit | Attending: General Surgery

## 2020-05-15 ENCOUNTER — Ambulatory Visit (HOSPITAL_COMMUNITY): Payer: Medicaid Other

## 2020-05-15 DIAGNOSIS — Z79899 Other long term (current) drug therapy: Secondary | ICD-10-CM | POA: Diagnosis not present

## 2020-05-15 DIAGNOSIS — Z6841 Body Mass Index (BMI) 40.0 and over, adult: Secondary | ICD-10-CM | POA: Diagnosis not present

## 2020-05-15 DIAGNOSIS — Z419 Encounter for procedure for purposes other than remedying health state, unspecified: Secondary | ICD-10-CM

## 2020-05-15 DIAGNOSIS — D573 Sickle-cell trait: Secondary | ICD-10-CM | POA: Insufficient documentation

## 2020-05-15 DIAGNOSIS — I10 Essential (primary) hypertension: Secondary | ICD-10-CM | POA: Insufficient documentation

## 2020-05-15 DIAGNOSIS — K219 Gastro-esophageal reflux disease without esophagitis: Secondary | ICD-10-CM | POA: Insufficient documentation

## 2020-05-15 DIAGNOSIS — F419 Anxiety disorder, unspecified: Secondary | ICD-10-CM | POA: Diagnosis not present

## 2020-05-15 DIAGNOSIS — F17210 Nicotine dependence, cigarettes, uncomplicated: Secondary | ICD-10-CM | POA: Insufficient documentation

## 2020-05-15 DIAGNOSIS — F329 Major depressive disorder, single episode, unspecified: Secondary | ICD-10-CM | POA: Insufficient documentation

## 2020-05-15 DIAGNOSIS — Z8249 Family history of ischemic heart disease and other diseases of the circulatory system: Secondary | ICD-10-CM | POA: Insufficient documentation

## 2020-05-15 DIAGNOSIS — K8010 Calculus of gallbladder with chronic cholecystitis without obstruction: Secondary | ICD-10-CM | POA: Insufficient documentation

## 2020-05-15 HISTORY — PX: CHOLECYSTECTOMY: SHX55

## 2020-05-15 LAB — PREGNANCY, URINE: Preg Test, Ur: NEGATIVE

## 2020-05-15 SURGERY — LAPAROSCOPIC CHOLECYSTECTOMY WITH INTRAOPERATIVE CHOLANGIOGRAM
Anesthesia: General

## 2020-05-15 MED ORDER — MIDAZOLAM HCL 2 MG/2ML IJ SOLN
INTRAMUSCULAR | Status: AC
  Filled 2020-05-15: qty 2

## 2020-05-15 MED ORDER — SUCCINYLCHOLINE CHLORIDE 200 MG/10ML IV SOSY
PREFILLED_SYRINGE | INTRAVENOUS | Status: AC
  Filled 2020-05-15: qty 10

## 2020-05-15 MED ORDER — GLYCOPYRROLATE PF 0.2 MG/ML IJ SOSY
PREFILLED_SYRINGE | INTRAMUSCULAR | Status: DC | PRN
  Administered 2020-05-15: .1 mg via INTRAVENOUS

## 2020-05-15 MED ORDER — LACTATED RINGERS IR SOLN
Status: DC | PRN
  Administered 2020-05-15: 1000 mL

## 2020-05-15 MED ORDER — OXYCODONE HCL 5 MG PO TABS: 5.0000 mg | ORAL_TABLET | Freq: Once | ORAL | Status: DC | PRN

## 2020-05-15 MED ORDER — BUPIVACAINE-EPINEPHRINE (PF) 0.25% -1:200000 IJ SOLN
INTRAMUSCULAR | Status: AC
  Filled 2020-05-15: qty 30

## 2020-05-15 MED ORDER — LIDOCAINE 2% (20 MG/ML) 5 ML SYRINGE
INTRAMUSCULAR | Status: DC | PRN
  Administered 2020-05-15: 1.5 mg/kg/h via INTRAVENOUS

## 2020-05-15 MED ORDER — PROPOFOL 10 MG/ML IV BOLUS
INTRAVENOUS | Status: AC
  Filled 2020-05-15: qty 20

## 2020-05-15 MED ORDER — OXYCODONE HCL 5 MG/5ML PO SOLN: 5.0000 mg | Freq: Once | ORAL | Status: DC | PRN

## 2020-05-15 MED ORDER — ONDANSETRON HCL 4 MG/2ML IJ SOLN
INTRAMUSCULAR | Status: AC
  Filled 2020-05-15: qty 2

## 2020-05-15 MED ORDER — ORAL CARE MOUTH RINSE: 15.0000 mL | Freq: Once | OROMUCOSAL | Status: AC

## 2020-05-15 MED ORDER — IOHEXOL 300 MG/ML  SOLN
INTRAMUSCULAR | Status: DC | PRN
  Administered 2020-05-15: 1 mL

## 2020-05-15 MED ORDER — DEXAMETHASONE SODIUM PHOSPHATE 10 MG/ML IJ SOLN
INTRAMUSCULAR | Status: AC
  Filled 2020-05-15: qty 1

## 2020-05-15 MED ORDER — BUPIVACAINE-EPINEPHRINE 0.25% -1:200000 IJ SOLN
INTRAMUSCULAR | Status: DC | PRN
  Administered 2020-05-15: 30 mL

## 2020-05-15 MED ORDER — PHENYLEPHRINE 40 MCG/ML (10ML) SYRINGE FOR IV PUSH (FOR BLOOD PRESSURE SUPPORT)
PREFILLED_SYRINGE | INTRAVENOUS | Status: DC | PRN
  Administered 2020-05-15: 120 ug via INTRAVENOUS

## 2020-05-15 MED ORDER — HYDROMORPHONE HCL 1 MG/ML IJ SOLN
INTRAMUSCULAR | Status: DC | PRN
  Administered 2020-05-15: 1 mg via INTRAVENOUS

## 2020-05-15 MED ORDER — KETOROLAC TROMETHAMINE 30 MG/ML IJ SOLN
INTRAMUSCULAR | Status: DC | PRN
  Administered 2020-05-15: 30 mg via INTRAVENOUS

## 2020-05-15 MED ORDER — OXYCODONE HCL 5 MG PO TABS: 5.0000 mg | ORAL_TABLET | Freq: Four times a day (QID) | ORAL | 0 refills | Status: DC | PRN

## 2020-05-15 MED ORDER — LACTATED RINGERS IV SOLN: INTRAVENOUS | Status: DC

## 2020-05-15 MED ORDER — GABAPENTIN 300 MG PO CAPS
300.0000 mg | ORAL_CAPSULE | ORAL | Status: AC
  Administered 2020-05-15: 300 mg via ORAL
  Filled 2020-05-15: qty 1

## 2020-05-15 MED ORDER — SUCCINYLCHOLINE CHLORIDE 200 MG/10ML IV SOSY
PREFILLED_SYRINGE | INTRAVENOUS | Status: DC | PRN
  Administered 2020-05-15: 120 mg via INTRAVENOUS

## 2020-05-15 MED ORDER — PROMETHAZINE HCL 25 MG/ML IJ SOLN
INTRAMUSCULAR | Status: AC
  Filled 2020-05-15: qty 1

## 2020-05-15 MED ORDER — ACETAMINOPHEN 500 MG PO TABS: 1000.0000 mg | ORAL_TABLET | Freq: Once | ORAL | Status: DC

## 2020-05-15 MED ORDER — SUGAMMADEX SODIUM 500 MG/5ML IV SOLN
INTRAVENOUS | Status: AC
  Filled 2020-05-15: qty 5

## 2020-05-15 MED ORDER — LIDOCAINE 2% (20 MG/ML) 5 ML SYRINGE
INTRAMUSCULAR | Status: AC
  Filled 2020-05-15: qty 10

## 2020-05-15 MED ORDER — SODIUM CHLORIDE 0.9 % IV SOLN
2.0000 g | INTRAVENOUS | Status: AC
  Administered 2020-05-15: 2 g via INTRAVENOUS
  Filled 2020-05-15: qty 2

## 2020-05-15 MED ORDER — CHLORHEXIDINE GLUCONATE 0.12 % MT SOLN
15.0000 mL | Freq: Once | OROMUCOSAL | Status: AC
  Administered 2020-05-15: 15 mL via OROMUCOSAL

## 2020-05-15 MED ORDER — HYDROMORPHONE HCL 1 MG/ML IJ SOLN
INTRAMUSCULAR | Status: AC
  Filled 2020-05-15: qty 1

## 2020-05-15 MED ORDER — MIDAZOLAM HCL 5 MG/5ML IJ SOLN
INTRAMUSCULAR | Status: DC | PRN
  Administered 2020-05-15: 1 mg via INTRAVENOUS
  Administered 2020-05-15: 2 mg via INTRAVENOUS

## 2020-05-15 MED ORDER — ROCURONIUM BROMIDE 10 MG/ML (PF) SYRINGE
PREFILLED_SYRINGE | INTRAVENOUS | Status: AC
  Filled 2020-05-15: qty 10

## 2020-05-15 MED ORDER — FENTANYL CITRATE (PF) 250 MCG/5ML IJ SOLN
INTRAMUSCULAR | Status: AC
  Filled 2020-05-15: qty 10

## 2020-05-15 MED ORDER — ACETAMINOPHEN 500 MG PO TABS
1000.0000 mg | ORAL_TABLET | ORAL | Status: AC
  Administered 2020-05-15: 1000 mg via ORAL
  Filled 2020-05-15: qty 2

## 2020-05-15 MED ORDER — LIDOCAINE 2% (20 MG/ML) 5 ML SYRINGE
INTRAMUSCULAR | Status: DC | PRN
  Administered 2020-05-15: 60 mg via INTRAVENOUS

## 2020-05-15 MED ORDER — KETOROLAC TROMETHAMINE 30 MG/ML IJ SOLN: 30.0000 mg | Freq: Once | INTRAMUSCULAR | Status: DC | PRN

## 2020-05-15 MED ORDER — PROMETHAZINE HCL 25 MG/ML IJ SOLN
6.2500 mg | INTRAMUSCULAR | Status: DC | PRN
  Administered 2020-05-15: 6.25 mg via INTRAVENOUS

## 2020-05-15 MED ORDER — CHLORHEXIDINE GLUCONATE CLOTH 2 % EX PADS: 6.0000 | MEDICATED_PAD | Freq: Once | CUTANEOUS | Status: DC

## 2020-05-15 MED ORDER — ROCURONIUM BROMIDE 10 MG/ML (PF) SYRINGE
PREFILLED_SYRINGE | INTRAVENOUS | Status: DC | PRN
  Administered 2020-05-15: 10 mg via INTRAVENOUS
  Administered 2020-05-15: 40 mg via INTRAVENOUS

## 2020-05-15 MED ORDER — ARTIFICIAL TEARS OPHTHALMIC OINT
TOPICAL_OINTMENT | OPHTHALMIC | Status: AC
  Filled 2020-05-15: qty 3.5

## 2020-05-15 MED ORDER — 0.9 % SODIUM CHLORIDE (POUR BTL) OPTIME
TOPICAL | Status: DC | PRN
  Administered 2020-05-15: 1000 mL

## 2020-05-15 MED ORDER — PROPOFOL 10 MG/ML IV BOLUS
INTRAVENOUS | Status: DC | PRN
  Administered 2020-05-15: 200 mg via INTRAVENOUS

## 2020-05-15 MED ORDER — DEXAMETHASONE SODIUM PHOSPHATE 10 MG/ML IJ SOLN
INTRAMUSCULAR | Status: DC | PRN
  Administered 2020-05-15: 10 mg via INTRAVENOUS

## 2020-05-15 MED ORDER — HYDROMORPHONE HCL 1 MG/ML IJ SOLN
0.2500 mg | INTRAMUSCULAR | Status: DC | PRN
  Administered 2020-05-15: 0.5 mg via INTRAVENOUS

## 2020-05-15 MED ORDER — ACETAMINOPHEN 500 MG PO TABS: 1000.0000 mg | ORAL_TABLET | Freq: Three times a day (TID) | ORAL | 0 refills | Status: AC

## 2020-05-15 MED ORDER — FENTANYL CITRATE (PF) 100 MCG/2ML IJ SOLN
INTRAMUSCULAR | Status: DC | PRN
Start: 2020-05-15 — End: 2020-05-15
  Administered 2020-05-15: 100 ug via INTRAVENOUS
  Administered 2020-05-15: 150 ug via INTRAVENOUS
  Administered 2020-05-15: 50 ug via INTRAVENOUS
  Administered 2020-05-15: 150 ug via INTRAVENOUS
  Administered 2020-05-15: 50 ug via INTRAVENOUS

## 2020-05-15 MED ORDER — KETAMINE HCL 10 MG/ML IJ SOLN
INTRAMUSCULAR | Status: DC | PRN
  Administered 2020-05-15: 30 mg via INTRAVENOUS

## 2020-05-15 MED ORDER — MEPERIDINE HCL 50 MG/ML IJ SOLN: 6.2500 mg | INTRAMUSCULAR | Status: DC | PRN

## 2020-05-15 MED ORDER — SUGAMMADEX SODIUM 500 MG/5ML IV SOLN
INTRAVENOUS | Status: DC | PRN
  Administered 2020-05-15: 300 mg via INTRAVENOUS

## 2020-05-15 MED ORDER — HYDROMORPHONE HCL 2 MG/ML IJ SOLN
INTRAMUSCULAR | Status: AC
  Filled 2020-05-15: qty 1

## 2020-05-15 SURGICAL SUPPLY — 60 items
ADH SKN CLS APL DERMABOND .7 (GAUZE/BANDAGES/DRESSINGS)
APL PRP STRL LF DISP 70% ISPRP (MISCELLANEOUS)
APL SKNCLS STERI-STRIP NONHPOA (GAUZE/BANDAGES/DRESSINGS)
APL SRG 38 LTWT LNG FL B (MISCELLANEOUS)
APPLICATOR ARISTA FLEXITIP XL (MISCELLANEOUS) IMPLANT
APPLIER CLIP 5 13 M/L LIGAMAX5 (MISCELLANEOUS) ×2
APPLIER CLIP ROT 10 11.4 M/L (STAPLE)
APR CLP MED LRG 11.4X10 (STAPLE)
APR CLP MED LRG 5 ANG JAW (MISCELLANEOUS) ×1
BAG SPEC RTRVL 10 TROC 200 (ENDOMECHANICALS) ×1
BENZOIN TINCTURE PRP APPL 2/3 (GAUZE/BANDAGES/DRESSINGS) IMPLANT
BNDG ADH 1X3 SHEER STRL LF (GAUZE/BANDAGES/DRESSINGS) ×8 IMPLANT
BNDG ADH THN 3X1 STRL LF (GAUZE/BANDAGES/DRESSINGS) ×4
CABLE HIGH FREQUENCY MONO STRZ (ELECTRODE) ×2 IMPLANT
CHLORAPREP W/TINT 26 (MISCELLANEOUS) IMPLANT
CLIP APPLIE 5 13 M/L LIGAMAX5 (MISCELLANEOUS) ×1 IMPLANT
CLIP APPLIE ROT 10 11.4 M/L (STAPLE) IMPLANT
CLIP VESOLOCK MED LG 6/CT (CLIP) IMPLANT
CLSR STERI-STRIP ANTIMIC 1/2X4 (GAUZE/BANDAGES/DRESSINGS) ×2 IMPLANT
COVER MAYO STAND STRL (DRAPES) ×2 IMPLANT
COVER SURGICAL LIGHT HANDLE (MISCELLANEOUS) ×2 IMPLANT
COVER WAND RF STERILE (DRAPES) IMPLANT
DECANTER SPIKE VIAL GLASS SM (MISCELLANEOUS) ×2 IMPLANT
DERMABOND ADVANCED (GAUZE/BANDAGES/DRESSINGS)
DERMABOND ADVANCED .7 DNX12 (GAUZE/BANDAGES/DRESSINGS) IMPLANT
DRAPE C-ARM 42X120 X-RAY (DRAPES) ×2 IMPLANT
DRSG TEGADERM 2-3/8X2-3/4 SM (GAUZE/BANDAGES/DRESSINGS) IMPLANT
DRSG TEGADERM 4X4.75 (GAUZE/BANDAGES/DRESSINGS) ×2 IMPLANT
DURAPREP 26ML APPLICATOR (WOUND CARE) ×2 IMPLANT
ELECT REM PT RETURN 15FT ADLT (MISCELLANEOUS) ×2 IMPLANT
GAUZE SPONGE 2X2 8PLY STRL LF (GAUZE/BANDAGES/DRESSINGS) ×1 IMPLANT
GLOVE BIO SURGEON STRL SZ7.5 (GLOVE) ×2 IMPLANT
GLOVE INDICATOR 8.0 STRL GRN (GLOVE) ×2 IMPLANT
GOWN STRL REUS W/TWL XL LVL3 (GOWN DISPOSABLE) ×8 IMPLANT
GRASPER SUT TROCAR 14GX15 (MISCELLANEOUS) ×2 IMPLANT
HEMOSTAT ARISTA ABSORB 3G PWDR (HEMOSTASIS) IMPLANT
HEMOSTAT SNOW SURGICEL 2X4 (HEMOSTASIS) IMPLANT
KIT BASIN OR (CUSTOM PROCEDURE TRAY) ×2 IMPLANT
KIT TURNOVER KIT A (KITS) ×2 IMPLANT
L-HOOK LAP DISP 36CM (ELECTROSURGICAL)
LHOOK LAP DISP 36CM (ELECTROSURGICAL) IMPLANT
POUCH RETRIEVAL ECOSAC 10 (ENDOMECHANICALS) ×1 IMPLANT
POUCH RETRIEVAL ECOSAC 10MM (ENDOMECHANICALS) ×1
SCISSORS LAP 5X35 DISP (ENDOMECHANICALS) ×2 IMPLANT
SET CHOLANGIOGRAPH MIX (MISCELLANEOUS) ×2 IMPLANT
SET IRRIG TUBING LAPAROSCOPIC (IRRIGATION / IRRIGATOR) ×2 IMPLANT
SET TUBE SMOKE EVAC HIGH FLOW (TUBING) ×2 IMPLANT
SLEEVE XCEL OPT CAN 5 100 (ENDOMECHANICALS) ×4 IMPLANT
SPONGE GAUZE 2X2 STER 10/PKG (GAUZE/BANDAGES/DRESSINGS) ×1
STRIP CLOSURE SKIN 1/2X4 (GAUZE/BANDAGES/DRESSINGS) IMPLANT
SUT MNCRL AB 4-0 PS2 18 (SUTURE) ×2 IMPLANT
SUT VIC AB 0 UR5 27 (SUTURE) IMPLANT
SUT VICRYL 0 TIES 12 18 (SUTURE) IMPLANT
SUT VICRYL 0 UR6 27IN ABS (SUTURE) ×2 IMPLANT
TOWEL OR 17X26 10 PK STRL BLUE (TOWEL DISPOSABLE) ×2 IMPLANT
TOWEL OR NON WOVEN STRL DISP B (DISPOSABLE) ×2 IMPLANT
TRAY LAPAROSCOPIC (CUSTOM PROCEDURE TRAY) ×2 IMPLANT
TROCAR BLADELESS OPT 5 100 (ENDOMECHANICALS) ×2 IMPLANT
TROCAR XCEL BLUNT TIP 100MML (ENDOMECHANICALS) IMPLANT
TROCAR XCEL NON-BLD 11X100MML (ENDOMECHANICALS) IMPLANT

## 2020-05-15 NOTE — Interval H&P Note (Signed)
History and Physical Interval Note:  05/15/2020 12:16 PM  Stephanie Gutierrez  has presented today for surgery, with the diagnosis of SYMPTOMATIC CHOLELITHIASIS.  The various methods of treatment have been discussed with the patient and family. After consideration of risks, benefits and other options for treatment, the patient has consented to  Procedure(s): LAPAROSCOPIC CHOLECYSTECTOMY WITH POSSIBLE INTRAOPERATIVE CHOLANGIOGRAM (N/A) as a surgical intervention.  The patient's history has been reviewed, patient examined, no change in status, stable for surgery.  I have reviewed the patient's chart and labs.  Questions were answered to the patient's satisfaction.     Gaynelle Adu

## 2020-05-15 NOTE — Transfer of Care (Signed)
Immediate Anesthesia Transfer of Care Note  Patient: Stephanie Gutierrez  Procedure(s) Performed: LAPAROSCOPIC CHOLECYSTECTOMY WITH  INTRAOPERATIVE CHOLANGIOGRAM (N/A )  Patient Location: PACU  Anesthesia Type:General  Level of Consciousness: awake, alert , oriented and patient cooperative  Airway & Oxygen Therapy: Patient Spontanous Breathing and Patient connected to face mask oxygen  Post-op Assessment: Report given to RN, Post -op Vital signs reviewed and stable and Patient moving all extremities X 4  Post vital signs: stable  Last Vitals:  Vitals Value Taken Time  BP 162/106 05/15/20 1430  Temp 36.8 C 05/15/20 1427  Pulse 98 05/15/20 1432  Resp 24 05/15/20 1432  SpO2 100 % 05/15/20 1432  Vitals shown include unvalidated device data.  Last Pain:  Vitals:   05/15/20 1427  TempSrc:   PainSc: 0-No pain         Complications: No complications documented.

## 2020-05-15 NOTE — Discharge Instructions (Signed)
[facility] LAPAROSCOPIC SURGERY: POST OP INSTRUCTIONS Always review your discharge instruction sheet given to you by the facility where your surgery was performed. IF YOU HAVE DISABILITY OR FAMILY LEAVE FORMS, YOU MUST BRING THEM TO THE OFFICE FOR PROCESSING.   DO NOT GIVE THEM TO YOUR DOCTOR.  PAIN CONTROL  1. First take acetaminophen (Tylenol) AND/or ibuprofen (Advil) to control your pain after surgery.  Follow directions on package.  Taking acetaminophen (Tylenol) and/or ibuprofen (Advil) regularly after surgery will help to control your pain and lower the amount of prescription pain medication you may need.  You should not take more than 3,000 mg (3 grams) of acetaminophen (Tylenol) in 24 hours.  You should not take ibuprofen (Advil), aleve, motrin, naprosyn or other NSAIDS if you have a history of stomach ulcers or chronic kidney disease.  2. A prescription for pain medication may be given to you upon discharge.  Take your pain medication as prescribed, if you still have uncontrolled pain after taking acetaminophen (Tylenol) or ibuprofen (Advil). 3. Use ice packs to help control pain. 4. If you need a refill on your pain medication, please contact your pharmacy.  They will contact our office to request authorization. Prescriptions will not be filled after 5pm or on week-ends.  HOME MEDICATIONS 5. Take your usually prescribed medications unless otherwise directed.  DIET 6. You should follow a light diet the first few days after arrival home.  Be sure to include lots of fluids daily. Avoid fatty, fried foods.   CONSTIPATION 7. It is common to experience some constipation after surgery and if you are taking pain medication.  Increasing fluid intake and taking a stool softener (such as Colace) will usually help or prevent this problem from occurring.  A mild laxative (Milk of Magnesia or Miralax) should be taken according to package instructions if there are no bowel  movements after 48 hours.  WOUND/INCISION CARE 8. Most patients will experience some swelling and bruising in the area of the incisions.  Ice packs will help.  Swelling and bruising can take several days to resolve.  9. Unless discharge instructions indicate otherwise, follow guidelines below  a. STERI-STRIPS - you may remove your outer bandages 48 hours after surgery, and you may shower at that time.  You have steri-strips (small skin tapes) in place directly over the incision.  These strips should be left on the skin for 7-10 days.   b. DERMABOND/SKIN GLUE - you may shower in 24 hours.  The glue will flake off over the next 2-3 weeks. 10. Any sutures or staples will be removed at the office during your follow-up visit.  ACTIVITIES 11. You may resume regular (light) daily activities beginning the next day--such as daily self-care, walking, climbing stairs--gradually increasing activities as tolerated.  You may have sexual intercourse when it is comfortable.  Refrain from any heavy lifting or straining until approved by your doctor. a. You may drive when you are no longer taking prescription pain medication, you can comfortably wear a seatbelt, and you can safely maneuver your car and apply brakes.  FOLLOW-UP 12. You should see your doctor in the office for a follow-up appointment approximately 2-3 weeks after your surgery.  You should have been given your post-op/follow-up appointment when your surgery was scheduled.  If you did not receive a post-op/follow-up appointment, make sure that you call for this appointment within a day or two after you arrive home to insure a convenient appointment time.  OTHER   INSTRUCTIONS 13.   WHEN TO CALL YOUR DOCTOR: 1. Fever over 101.0 2. Inability to urinate 3. Continued bleeding from incision. 4. Increased pain, redness, or drainage from the incision. 5. Increasing abdominal pain  The clinic staff is available to answer your questions during regular  business hours.  Please don't hesitate to call and ask to speak to one of the nurses for clinical concerns.  If you have a medical emergency, go to the nearest emergency room or call 911.  A surgeon from [facility] is always on call at the hospital. [address], [address], [place], [place]  [address] ? [address] [address], [place], [place]   [address] [phone] ? [phone] ? FAX [phone] Web site: [email or web address]  .........   Managing Your Pain After Surgery Without Opioids    Thank you for participating in our program to help patients manage their pain after surgery without opioids. This is part of our effort to provide you with the best care possible, without exposing you or your family to the risk that opioids pose.  What pain can I expect after surgery? You can expect to have some pain after surgery. This is normal. The pain is typically worse the day after surgery, and quickly begins to get better. Many studies have found that many patients are able to manage their pain after surgery with Over-the-Counter (OTC) medications such as Tylenol and Motrin. If you have a condition that does not allow you to take Tylenol or Motrin, notify your surgical team.  How will I manage my pain? The best strategy for controlling your pain after surgery is around the clock pain control with Tylenol (acetaminophen) and Motrin (ibuprofen or Advil). Alternating these medications with each other allows you to maximize your pain control. In addition to Tylenol and Motrin, you can use heating pads or ice packs on your incisions to help reduce your pain.  How will I alternate your regular strength over-the-counter pain medication? You will take a dose of pain medication every three hours. ; Start by taking 650 mg of Tylenol (2 pills of 325 mg) ; 3 hours later take 600 mg of Motrin (3 pills of 200 mg) ; 3 hours after taking the Motrin take 650 mg of Tylenol ; 3 hours  after that take 600 mg of Motrin.   - 1 -  See example - if your first dose of Tylenol is at 12:00 PM   12:00 PM Tylenol 650 mg (2 pills of 325 mg)  3:00 PM Motrin 600 mg (3 pills of 200 mg)  6:00 PM Tylenol 650 mg (2 pills of 325 mg)  9:00 PM Motrin 600 mg (3 pills of 200 mg)  Continue alternating every 3 hours   We recommend that you follow this schedule around-the-clock for at least 3 days after surgery, or until you feel that it is no longer needed. Use the table on the last page of this handout to keep track of the medications you are taking. Important: Do not take more than 3000mg of Tylenol or 1800mg of Motrin in a 24-hour period. Do not take ibuprofen/Motrin if you have a history of bleeding stomach ulcers, severe kidney disease, &/or actively taking a blood thinner  What if I still have pain? If you have pain that is not controlled with the over-the-counter pain medications (Tylenol and Motrin or Advil) you might have what we call "breakthrough" pain. You will receive a prescription for a small amount of an opioid   pain medication such as Oxycodone, Tramadol, or Tylenol with Codeine. Use these opioid pills in the first 24 hours after surgery if you have breakthrough pain. Do not take more than 1 pill every 4-6 hours.  If you still have uncontrolled pain after using all opioid pills, don't hesitate to call our staff using the number provided. We will help make sure you are managing your pain in the best way possible, and if necessary, we can provide a prescription for additional pain medication.   Day 1    Time  Name of Medication Number of pills taken  Amount of Acetaminophen  Pain Level   Comments  AM PM       AM PM       AM PM       AM PM       AM PM       AM PM       AM PM       AM PM       Total Daily amount of Acetaminophen Do not take more than  3,000 mg per day      Day 2    Time  Name of Medication Number of pills taken  Amount of Acetaminophen  Pain  Level   Comments  AM PM       AM PM       AM PM       AM PM       AM PM       AM PM       AM PM       AM PM       Total Daily amount of Acetaminophen Do not take more than  3,000 mg per day      Day 3    Time  Name of Medication Number of pills taken  Amount of Acetaminophen  Pain Level   Comments  AM PM       AM PM       AM PM       AM PM          AM PM       AM PM       AM PM       AM PM       Total Daily amount of Acetaminophen Do not take more than  3,000 mg per day      Day 4    Time  Name of Medication Number of pills taken  Amount of Acetaminophen  Pain Level   Comments  AM PM       AM PM       AM PM       AM PM       AM PM       AM PM       AM PM       AM PM       Total Daily amount of Acetaminophen Do not take more than  3,000 mg per day      Day 5    Time  Name of Medication Number of pills taken  Amount of Acetaminophen  Pain Level   Comments  AM PM       AM PM       AM PM       AM PM       AM PM       AM PM         AM PM       AM PM       Total Daily amount of Acetaminophen Do not take more than  3,000 mg per day       Day 6    Time  Name of Medication Number of pills taken  Amount of Acetaminophen  Pain Level  Comments  AM PM       AM PM       AM PM       AM PM       AM PM       AM PM       AM PM       AM PM       Total Daily amount of Acetaminophen Do not take more than  3,000 mg per day      Day 7    Time  Name of Medication Number of pills taken  Amount of Acetaminophen  Pain Level   Comments  AM PM       AM PM       AM PM       AM PM       AM PM       AM PM       AM PM       AM PM       Total Daily amount of Acetaminophen Do not take more than  3,000 mg per day        For additional information about how and where to safely dispose of unused opioid medications - https://www.morepowerful[employer].org  Disclaimer: This document contains information and/or instructional materials adapted  from [facility] for the typical patient with your condition. It does not replace medical advice from your health care provider because your experience may differ from that of the typical patient. Talk to your health care provider if you have any questions about this document, your condition or your treatment plan. Adapted from [facility]   

## 2020-05-15 NOTE — Anesthesia Postprocedure Evaluation (Signed)
Anesthesia Post Note  Patient: Stephanie Gutierrez  Procedure(s) Performed: LAPAROSCOPIC CHOLECYSTECTOMY WITH  INTRAOPERATIVE CHOLANGIOGRAM (N/A )     Patient location during evaluation: PACU Anesthesia Type: General Level of consciousness: awake and alert, oriented and patient cooperative Pain management: pain level controlled Vital Signs Assessment: post-procedure vital signs reviewed and stable Respiratory status: spontaneous breathing, nonlabored ventilation and respiratory function stable Cardiovascular status: blood pressure returned to baseline and stable Postop Assessment: no apparent nausea or vomiting Anesthetic complications: no   No complications documented.  Last Vitals:  Vitals:   05/15/20 1427 05/15/20 1430  BP: (!) 154/91 (!) 162/106  Pulse: 100 (!) 101  Resp: 20 (!) 23  Temp: 36.8 C   SpO2: 96% 99%    Last Pain:  Vitals:   05/15/20 1427  TempSrc:   PainSc: 0-No pain                 Lannie Fields

## 2020-05-15 NOTE — H&P (Signed)
CC: abd pain  Requesting provider:   HPI: Stephanie Gutierrez is an 29 y.o. female who is here for lajp chole. Denies medical changes since seen in clinic. Still having elevated BP even at home. No HA, vision changes  The patient is a 29 year old female who presents for evaluation of gall stones. she is referred by dr Pilar Plate in the ED for evaluation of gallbladder problems. She initially presented to the emergency room on July 1 with complaints of several hours of pain on her right side in her upper abdomen. She had nausea it radiated to the right side. It was not getting better so she went to the emergency room. She underwent an ultrasound which I reviewed which showed fatty liver as well as a gallstone. Her labs were unremarkable and she was discharged home with referral to our office. She represented again to the ER on July 6 with similar symptoms. She is accompanied by her mother today. She states that she has tried to modify her diet and as such she has not really had as severe symptoms as the 2 episodes that prompted her to go to the emergency room. She has a bowel movement daily. She states that she might of had a similar episode 1 year ago but it was not nearly as bad as the episode at the beginning of the month. She states that she is sickle cell trait but otherwise no medical problems. She does smoke. A pack last 2 to 3 days. No prior abdominal surgeries. No fever or chills. No acholic stools.  Past Medical History:  Diagnosis Date  . Anxiety   . Depression   . Gallstones   . Hypertension    not on meds   . Obesity     Past Surgical History:  Procedure Laterality Date  . NO PAST SURGERIES      Family History  Problem Relation Age of Onset  . Hypertension Mother   . CAD Other   . Diabetes Mellitus II Other     Social:  reports that she has been smoking cigarettes. She has been smoking about 0.25 packs per day. She has never used smokeless tobacco. She reports current  alcohol use. She reports that she does not use drugs.  Allergies: No Known Allergies  Medications: I have reviewed the patient's current medications.  Results for orders placed or performed during the hospital encounter of 05/15/20 (from the past 48 hour(s))  Pregnancy, urine     Status: None   Collection Time: 05/15/20 11:23 AM  Result Value Ref Range   Preg Test, Ur NEGATIVE NEGATIVE    Comment:        THE SENSITIVITY OF THIS METHODOLOGY IS >20 mIU/mL. Performed at [facility], [address] [place], [place] [address]     No results found.  ROS - all of the below systems have been reviewed with the patient and positives are indicated with bold text General: chills, fever or night sweats Eyes: blurry vision or double vision ENT: epistaxis or sore throat Allergy/Immunology: itchy/watery eyes or nasal congestion Hematologic/Lymphatic: bleeding problems, blood clots or swollen lymph nodes Endocrine: temperature intolerance or unexpected weight changes Breast: new or changing breast lumps or nipple discharge Resp: cough, shortness of breath, or wheezing CV: chest pain or dyspnea on exertion GI: as per HPI GU: dysuria, trouble voiding, or hematuria MSK: joint pain or joint stiffness Neuro: TIA or stroke symptoms Derm: pruritus and skin lesion changes Psych: anxiety and depression  PE Blood pressure (!) 168/110, pulse 96, temperature 99.8 F (37.7 C), temperature source Oral, resp. rate 16, height 5\' 3"  (1.6 m), weight 112 kg, SpO2 100 %. Constitutional: NAD; conversant; no deformities Eyes: Moist conjunctiva; no lid lag; anicteric; PERRL Neck: Trachea midline; no thyromegaly Lungs: Normal respiratory effort; no tactile fremitus CV: RRR; no palpable thrills; no pitting edema GI: Abd soft obese, nt; no palpable hepatosplenomegaly MSK: Normal gait; no clubbing/cyanosis Psychiatric: Appropriate affect; alert and oriented x3 Lymphatic: No palpable cervical  or axillary lymphadenopathy  Results for orders placed or performed during the hospital encounter of 05/15/20 (from the past 48 hour(s))  Pregnancy, urine     Status: None   Collection Time: 05/15/20 11:23 AM  Result Value Ref Range   Preg Test, Ur NEGATIVE NEGATIVE    Comment:        THE SENSITIVITY OF THIS METHODOLOGY IS >20 mIU/mL. Performed at [facility], [address], [place], [place]     No results found.  Imaging: reviewed  A/P: Stephanie Gutierrez is an 29 y.o. female with  Symptomatic cholelithiasis Severe obesity Elevated BP  To OR for Lap chole ERAS IV Abx D/w pt need to f/u with pcp to discuss BP All questions asked and answered rediscussed postop instructions  37. Mary Sella, MD, FACS General, Bariatric, & Minimally Invasive Surgery [facility] Surgery, [facility]

## 2020-05-15 NOTE — Anesthesia Procedure Notes (Signed)
Procedure Name: Intubation Date/Time: 05/15/2020 12:58 PM Performed by: Lissa Morales, CRNA Pre-anesthesia Checklist: Patient identified, Emergency Drugs available, Suction available and Patient being monitored Patient Re-evaluated:Patient Re-evaluated prior to induction Oxygen Delivery Method: Circle system utilized Preoxygenation: Pre-oxygenation with 100% oxygen Induction Type: IV induction Ventilation: Mask ventilation without difficulty Laryngoscope Size: Mac and 4 Grade View: Grade III Tube type: Oral Tube size: 7.0 mm Number of attempts: 1 Airway Equipment and Method: Stylet and Oral airway Placement Confirmation: ETT inserted through vocal cords under direct vision,  positive ETCO2 and breath sounds checked- equal and bilateral Secured at: 22 cm Tube secured with: Tape Dental Injury: Teeth and Oropharynx as per pre-operative assessment  Difficulty Due To: Difficult Airway- due to anterior larynx

## 2020-05-15 NOTE — Op Note (Signed)
Stephanie Gutierrez [id number] 1991-01-14 05/15/2020  Laparoscopic Cholecystectomy with attempted IOC Procedure Note  Indications: This patient presents with symptomatic gallbladder disease and will undergo laparoscopic cholecystectomy.  Pre-operative Diagnosis: symptomatic cholelithiasis  Post-operative Diagnosis: Same  Surgeon: Gaynelle Adu MD FACS  Assistants: Ovidio Kin MD FACS  Anesthesia: General endotracheal anesthesia  Procedure Details  The patient was seen again in the Holding Room. The risks, benefits, complications, treatment options, and expected outcomes were discussed with the patient. The possibilities of reaction to medication, pulmonary aspiration, perforation of viscus, bleeding, recurrent infection, finding a normal gallbladder, the need for additional procedures, failure to diagnose a condition, the possible need to convert to an open procedure, and creating a complication requiring transfusion or operation were discussed with the patient. The likelihood of improving the patient's symptoms with return to their baseline status is good.  The patient and/or family concurred with the proposed plan, giving informed consent. The site of surgery properly noted. The patient was taken to Operating Room, identified as Stephanie Gutierrez and the procedure verified as Laparoscopic Cholecystectomy with Intraoperative Cholangiogram. A Time Out was held and the above information confirmed. Antibiotic prophylaxis was administered.   Prior to the induction of general anesthesia, antibiotic prophylaxis was administered. General endotracheal anesthesia was then administered and tolerated well. After the induction, the abdomen was prepped with Chloraprep and draped in the sterile fashion. The patient was positioned in the supine position.  Local anesthetic agent was injected into the skin near the umbilicus and an incision made. We dissected down to the abdominal fascia with blunt dissection.  The fascia  was incised vertically and we entered the peritoneal cavity bluntly.  A pursestring suture of 0-Vicryl was placed around the fascial opening.  The Hasson cannula was inserted and secured with the stay suture.  Pneumoperitoneum was then created with CO2 and tolerated well without any adverse changes in the patient's vital signs. An 5-mm port was placed in the subxiphoid position.  Two 5-mm ports were placed in the right upper quadrant. All skin incisions were infiltrated with a local anesthetic agent before making the incision and placing the trocars.   We positioned the patient in reverse Trendelenburg, tilted slightly to the patient's left.  The patient had a mildly steatotic appearing liver.  The gallbladder was identified, the fundus grasped and retracted cephalad by the assistant. Adhesions were lysed bluntly and with the electrocautery where indicated, taking care not to injure any adjacent organs or viscus. The infundibulum was grasped and retracted laterally, exposing the peritoneum overlying the triangle of Calot. This was then divided and exposed in a blunt fashion. A critical view of the cystic duct and cystic artery was obtained.  The cystic duct was clearly identified and bluntly dissected circumferentially. The cystic duct was ligated with a clip distally.   An incision was made in the cystic duct.  A small stone spilled out.  There is then free flow of bile from the cystic duct.  I milked the cystic duct with the [facility].  And the [facility] cholangiogram catheter introduced.  I initially had trouble threading the cholangiogram catheter into the cystic duct and getting it properly secured.  The catheter was eventually secured using a clip. A cholangiogram was then attempted.  However there was significant resistance.  We returned laparoscopically.  The clip securing the cholangiogram catheter was removed.  There was still flow of bile coming from the cystic ductotomy.I milked the cystic duct and  gently pressed on the common bile  duct.  I could not get the cholangiogram catheter repositioned back into the cystic duct.  Therefore I decided not to proceed with a cholangiogram.  Her LFTs and common bile duct were normal preoperatively.   The cystic duct was then ligated with clips and divided. The cystic artery which had been identified & dissected free was ligated with clips and divided as well.   The gallbladder was dissected from the liver bed in retrograde fashion with the electrocautery with the aid of the assistant retracting the gallbladder and the liver.  There was some spillage of bile from the gallbladder but no stone spillage.  The gallbladder was removed and placed in an Ecco sac.  The gallbladder and Ecco sac were then removed through the umbilical port site. The liver bed was irrigated and inspected. Hemostasis was achieved with the electrocautery. Copious irrigation was utilized and was repeatedly aspirated until clear.  The pursestring suture was used to close the umbilical fascia.  Given her severe obesity an additional interrupted 0 Vicryl suture was placed at the umbilical fascia with the PMI suture passer with laparoscopic guidance.  Local was infiltrated around this area along the right lateral abdominal wall as a regional block.  We again inspected the right upper quadrant for hemostasis.  The umbilical closure was inspected and there was no air leak and nothing trapped within the closure. Pneumoperitoneum was released as we removed the trocars.  4-0 Monocryl was used to close the skin.   Dermabond was applied. The patient was then extubated and brought to the recovery room in stable condition. Instrument, sponge, and needle counts were correct at closure and at the conclusion of the case.   Findings: Severe obesity, mild hepatic steatosis, positive critical view An assistant was used due to the patient's central truncal obesity as well as to help retract and mobilize  tissue  Estimated Blood Loss: Minimal         Drains: None         Specimens: Gallbladder           Complications: None; patient tolerated the procedure well.         Disposition: PACU - hemodynamically stable.         Condition: stable  Mary Sella. Andrey Campanile, MD, FACS General, Bariatric, & Minimally Invasive Surgery [facility] Surgery, [place]

## 2020-05-16 ENCOUNTER — Encounter (HOSPITAL_COMMUNITY): Payer: Self-pay | Admitting: General Surgery

## 2020-05-16 LAB — SURGICAL PATHOLOGY

## 2020-05-22 ENCOUNTER — Other Ambulatory Visit (HOSPITAL_COMMUNITY): Payer: Medicaid Other

## 2020-05-25 ENCOUNTER — Encounter (HOSPITAL_COMMUNITY): Payer: Medicaid Other

## 2020-05-28 ENCOUNTER — Other Ambulatory Visit (HOSPITAL_COMMUNITY): Payer: Medicaid Other

## 2020-08-21 ENCOUNTER — Encounter (HOSPITAL_COMMUNITY): Payer: Self-pay

## 2020-08-21 ENCOUNTER — Emergency Department (HOSPITAL_COMMUNITY)
Admission: EM | Admit: 2020-08-21 | Discharge: 2020-08-22 | Disposition: A | Discharge status: Home / Self Care | Payer: Medicaid Other | Attending: Emergency Medicine | Admitting: Emergency Medicine

## 2020-08-21 ENCOUNTER — Ambulatory Visit (HOSPITAL_COMMUNITY)
Admission: EM | Admit: 2020-08-21 | Discharge: 2020-08-21 | Payer: Medicaid Other | Attending: Urgent Care | Admitting: Urgent Care

## 2020-08-21 ENCOUNTER — Other Ambulatory Visit: Payer: Self-pay

## 2020-08-21 ENCOUNTER — Emergency Department (HOSPITAL_COMMUNITY): Payer: Medicaid Other

## 2020-08-21 DIAGNOSIS — R0789 Other chest pain: Secondary | ICD-10-CM

## 2020-08-21 DIAGNOSIS — R519 Headache, unspecified: Secondary | ICD-10-CM

## 2020-08-21 DIAGNOSIS — Z79899 Other long term (current) drug therapy: Secondary | ICD-10-CM | POA: Insufficient documentation

## 2020-08-21 DIAGNOSIS — R079 Chest pain, unspecified: Secondary | ICD-10-CM

## 2020-08-21 DIAGNOSIS — M79602 Pain in left arm: Secondary | ICD-10-CM | POA: Insufficient documentation

## 2020-08-21 DIAGNOSIS — M795 Residual foreign body in soft tissue: Secondary | ICD-10-CM | POA: Diagnosis not present

## 2020-08-21 DIAGNOSIS — I10 Essential (primary) hypertension: Secondary | ICD-10-CM

## 2020-08-21 DIAGNOSIS — Z8739 Personal history of other diseases of the musculoskeletal system and connective tissue: Secondary | ICD-10-CM

## 2020-08-21 DIAGNOSIS — M79601 Pain in right arm: Secondary | ICD-10-CM | POA: Insufficient documentation

## 2020-08-21 DIAGNOSIS — T148XXA Other injury of unspecified body region, initial encounter: Secondary | ICD-10-CM

## 2020-08-21 DIAGNOSIS — Y93F2 Activity, caregiving, lifting: Secondary | ICD-10-CM | POA: Insufficient documentation

## 2020-08-21 DIAGNOSIS — X500XXA Overexertion from strenuous movement or load, initial encounter: Secondary | ICD-10-CM | POA: Insufficient documentation

## 2020-08-21 DIAGNOSIS — Y9289 Other specified places as the place of occurrence of the external cause: Secondary | ICD-10-CM | POA: Diagnosis not present

## 2020-08-21 DIAGNOSIS — F17210 Nicotine dependence, cigarettes, uncomplicated: Secondary | ICD-10-CM | POA: Insufficient documentation

## 2020-08-21 DIAGNOSIS — S39012A Strain of muscle, fascia and tendon of lower back, initial encounter: Secondary | ICD-10-CM | POA: Diagnosis not present

## 2020-08-21 LAB — CBC
HCT: 40.9 % (ref 36.0–46.0)
Hemoglobin: 13.4 g/dL (ref 12.0–15.0)
MCH: 27.7 pg (ref 26.0–34.0)
MCHC: 32.8 g/dL (ref 30.0–36.0)
MCV: 84.5 fL (ref 80.0–100.0)
Platelets: 268 10*3/uL (ref 150–400)
RBC: 4.84 MIL/uL (ref 3.87–5.11)
RDW: 12.4 % (ref 11.5–15.5)
WBC: 5.7 10*3/uL (ref 4.0–10.5)
nRBC: 0 % (ref 0.0–0.2)

## 2020-08-21 LAB — BASIC METABOLIC PANEL
Anion gap: 11 (ref 5–15)
BUN: 7 mg/dL (ref 6–20)
CO2: 24 mmol/L (ref 22–32)
Calcium: 9 mg/dL (ref 8.9–10.3)
Chloride: 105 mmol/L (ref 98–111)
Creatinine, Ser: 0.82 mg/dL (ref 0.44–1.00)
GFR, Estimated: >60 mL/min (ref >60)
Glucose, Bld: 107 mg/dL — ABNORMAL HIGH (ref 70–99)
Potassium: 3.6 mmol/L (ref 3.5–5.1)
Sodium: 140 mmol/L (ref 135–145)

## 2020-08-21 LAB — TROPONIN I (HIGH SENSITIVITY)
Troponin I (High Sensitivity): 3 ng/L (ref <18)
Troponin I (High Sensitivity): 3 ng/L (ref <18)

## 2020-08-21 LAB — I-STAT BETA HCG BLOOD, ED (MC, WL, AP ONLY): I-stat hCG, quantitative: <5 m[IU]/mL (ref <5)

## 2020-08-21 MED ORDER — ACETAMINOPHEN 325 MG PO TABS
ORAL_TABLET | ORAL | Status: AC
  Filled 2020-08-21: qty 2

## 2020-08-21 MED ORDER — ACETAMINOPHEN 325 MG PO TABS
650.0000 mg | ORAL_TABLET | Freq: Once | ORAL | Status: AC
  Administered 2020-08-21: 650 mg via ORAL

## 2020-08-21 NOTE — ED Triage Notes (Signed)
Pt here via EMS from [facility] for eval of bilateral arm and chest pain after moving heavy boxes last night at work. L arm hurts worse than R arm. EKG was done at [facility], which did look different than the last one that they had on file.

## 2020-08-21 NOTE — ED Triage Notes (Signed)
Pt c/o bilateral arm pain that increases when attempting to raise above head onset upon waking.  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 Pt states she has had "fluid build up" in her upper arms in the past and had to be hospitalized for same.  No obvious edema noted to arms. +2 radial pulses bilaterally Took tylenol last night for back pain.

## 2020-08-21 NOTE — ED Notes (Signed)
Patient is being discharged from the Urgent Care and sent to the Emergency Department via EMS. Per provider Wallis Bamberg, patient is in need of higher level of care due to hypertensive crisis & acute rhabdommyolisis. Patient is aware and verbalizes understanding of plan of care.   Vitals:   08/21/20 1706  BP: (!) 210/116  Pulse: 75  Resp: 18  Temp: 97.8 F (36.6 C)  SpO2: 100%

## 2020-08-21 NOTE — ED Provider Notes (Signed)
[facility]   MRN: [id number] DOB: 09-May-1991  Subjective:   Stephanie Gutierrez is a 29 y.o. female presenting for acute onset this morning of bilateral arm pain, left sided chest pain that radiates into her left arm. Has also had intermittent headaches but not while in our clinic. Last had chest pain ~30 minutes ago, resolved without any medications. Has previously had to be hospitalized for rhabdomyolysis in 2016. Smokes 6 cigarettes per day. Has occasional alcohol drink. No drug use. Has hx of HTN but is not on any medication for this. Denies hx of diabetes. Hx of cholecystectomy in 04/2020.   No current facility-administered medications for this encounter.  Current Outpatient Medications:  .  famotidine (PEPCID) 20 MG tablet, Take 1 tablet (20 mg total) by mouth 2 (two) times daily. (Patient not taking: Reported on 04/26/2020), Disp: 30 tablet, Rfl: 0 .  ibuprofen (ADVIL) 800 MG tablet, Take 1 tablet (800 mg total) by mouth 3 (three) times daily with meals. (Patient not taking: Reported on 04/26/2020), Disp: 21 tablet, Rfl: 0 .  ondansetron (ZOFRAN-ODT) 4 MG disintegrating tablet, Take 1 tablet (4 mg total) by mouth every 8 (eight) hours as needed for nausea or vomiting., Disp: 15 tablet, Rfl: 0 .  oxyCODONE (OXY IR/ROXICODONE) 5 MG immediate release tablet, Take 1 tablet (5 mg total) by mouth every 6 (six) hours as needed for severe pain., Disp: 15 tablet, Rfl: 0   No Known Allergies  Past Medical History:  Diagnosis Date  . Anxiety   . Depression   . Gallstones   . Hypertension    not on meds   . Obesity      Past Surgical History:  Procedure Laterality Date  . CHOLECYSTECTOMY N/A 05/15/2020   Procedure: LAPAROSCOPIC CHOLECYSTECTOMY WITH  INTRAOPERATIVE CHOLANGIOGRAM;  Surgeon: Gaynelle Adu, MD;  Location: [facility] ORS;  Service: General;  Laterality: N/A;  . NO PAST SURGERIES      Family History  Problem Relation Age of Onset  . Hypertension Mother   . CAD Other   .  Diabetes Mellitus II Other     Social History   Tobacco Use  . Smoking status: Current Every Day Smoker    Packs/day: 0.25    Types: Cigarettes  . Smokeless tobacco: Never Used  Vaping Use  . Vaping Use: Never used  Substance Use Topics  . Alcohol use: Yes    Comment: rare  . Drug use: No    ROS   Objective:   Vitals: BP (!) 210/116 (BP Location: Left Wrist)   Pulse 75   Temp 97.8 F (36.6 C) (Oral)   Resp 18   LMP 06/28/2020 (Approximate)   SpO2 100%   BP Readings from Last 3 Encounters:  08/21/20 (!) 210/116  05/15/20 (!) 156/97  05/11/20 (!) 152/94   Physical Exam Constitutional:      General: She is not in acute distress.    Appearance: Normal appearance. She is well-developed and normal weight. She is not ill-appearing, toxic-appearing or diaphoretic.  HENT:     Head: Normocephalic and atraumatic.     Right Ear: External ear normal.     Left Ear: External ear normal.     Nose: Nose normal.     Mouth/Throat:     Mouth: Mucous membranes are moist.     Pharynx: Oropharynx is clear.  Eyes:     General: No scleral icterus.    Extraocular Movements: Extraocular movements intact.     Pupils:  Pupils are equal, round, and reactive to light.  Cardiovascular:     Rate and Rhythm: Normal rate and regular rhythm.     Pulses: Normal pulses.     Heart sounds: Normal heart sounds. No murmur heard.  No friction rub. No gallop.   Pulmonary:     Effort: Pulmonary effort is normal. No respiratory distress.     Breath sounds: Normal breath sounds. No stridor. No wheezing, rhonchi or rales.  Abdominal:     General: Bowel sounds are normal. There is no distension.     Palpations: Abdomen is soft. There is no mass.     Tenderness: There is no abdominal tenderness. There is no right CVA tenderness, left CVA tenderness, guarding or rebound.  Skin:    General: Skin is warm and dry.     Coloration: Skin is not pale.     Findings: No rash.  Neurological:     General: No  focal deficit present.     Mental Status: She is alert and oriented to person, place, and time.     Cranial Nerves: No cranial nerve deficit.     Motor: No weakness.     Coordination: Coordination normal.     Gait: Gait normal.     Deep Tendon Reflexes: Reflexes normal.  Psychiatric:        Mood and Affect: Mood normal.        Behavior: Behavior normal.        Thought Content: Thought content normal.        Judgment: Judgment normal.    ED ECG REPORT   Date: 08/21/2020  Rate: 70bpm  Rhythm: normal sinus rhythm  QRS Axis: normal  Intervals: normal  ST/T Wave abnormalities: nonspecific T wave changes  Conduction Disutrbances:none  Narrative Interpretation: Sinus rhythm at 70 bpm comparable to previous ecg. No acute findings.   Old EKG Reviewed: unchanged  I have personally reviewed the EKG tracing and agree with the computerized printout as noted.  Assessment and Plan :   PDMP not reviewed this encounter.  1. Bilateral arm pain   2. History of rhabdomyolysis   3. Atypical chest pain   4. Generalized headaches     High suspicion of rhabdomyolysis. EKG without acute findings but needs emergent evaluation at the hospital given her hypertensive urgency. Patient given APAP prior to EMS arrival.    Wallis Bamberg, [place] 08/21/20 1725

## 2020-08-22 MED ORDER — KETOROLAC TROMETHAMINE 60 MG/2ML IM SOLN
60.0000 mg | Freq: Once | INTRAMUSCULAR | Status: AC
  Administered 2020-08-22: 60 mg via INTRAMUSCULAR
  Filled 2020-08-22: qty 2

## 2020-08-22 MED ORDER — CYCLOBENZAPRINE HCL 10 MG PO TABS: 10.0000 mg | ORAL_TABLET | Freq: Two times a day (BID) | ORAL | 0 refills | Status: DC | PRN

## 2020-08-22 MED ORDER — AMLODIPINE BESYLATE 5 MG PO TABS: 5.0000 mg | ORAL_TABLET | Freq: Every day | ORAL | 0 refills | Status: DC

## 2020-08-22 NOTE — ED Provider Notes (Signed)
[facility]   CSN: [id number] Arrival date & time: 08/21/20  1810     History No chief complaint on file.   Stephanie Gutierrez is a 29 y.o. female.  HPI     29 year old female with a history of hypertension per chart, anxiety presents with concern for bilateral arm and chest pain--sent from urgent care for above and hypertension.  She works for the [employer] and reports that 3 days ago she had been transition to the [place] and had been lifting more boxes.  Yesterday reports that she was lifting for approximately 7 hours.  This morning, she woke up with aching of her bilateral arms extending into the left side of her upper chest.  Reports the pain is worse with movements and that she has associated swelling.  The pain is severe.  Denies shortness of breath, nausea.  Denies leg pain or swelling, history of DVT or PE, recent surgeries or immobilization.  Reports family history of coronary artery disease in her grandmother in her 4350s, but no immediate family history.  Does report her mom has a history of hypertension, and she likely has a history of same.  Reports that she has had chronically elevated blood pressures when she is gone to the doctor, but does not have a regular doctor that she has seen her about it.  Reports she does smoke cigarettes, reports occasional alcohol and denies drug use.  She went to an urgent care was transferred to the emergency department for further care.    Past Medical History:  Diagnosis Date  . Anxiety   . Depression   . Gallstones   . Hypertension    not on meds   . Obesity     Patient Active Problem List   Diagnosis Date Noted  . Rhabdomyolysis 07/10/2014    Past Surgical History:  Procedure Laterality Date  . CHOLECYSTECTOMY N/A 05/15/2020   Procedure: LAPAROSCOPIC CHOLECYSTECTOMY WITH  INTRAOPERATIVE CHOLANGIOGRAM;  Surgeon: Gaynelle AduWilson, Eric, MD;  Location: [facility];  Service: General;   Laterality: N/A;  . NO PAST SURGERIES       OB History    Gravida  0   Para  0   Term  0   Preterm  0   AB  0   Living  0     SAB  0   TAB  0   Ectopic  0   Multiple  0   Live Births  0           Family History  Problem Relation Age of Onset  . Hypertension Mother   . CAD Other   . Diabetes Mellitus II Other     Social History   Tobacco Use  . Smoking status: Current Every Day Smoker    Packs/day: 0.25    Types: Cigarettes  . Smokeless tobacco: Never Used  Vaping Use  . Vaping Use: Never used  Substance Use Topics  . Alcohol use: Yes    Comment: rare  . Drug use: No    Home Medications Prior to Admission medications   Medication Sig Start Date End Date Taking? Authorizing Provider  amLODipine (NORVASC) 5 MG tablet Take 1 tablet (5 mg total) by mouth daily. 08/22/20   Alvira MondaySchlossman, Trystan Eads, MD  cyclobenzaprine (FLEXERIL) 10 MG tablet Take 1 tablet (10 mg total) by mouth 2 (two) times daily as needed for muscle spasms. 08/22/20   Alvira MondaySchlossman, Ladajah Soltys, MD  famotidine (PEPCID) 20 MG  tablet Take 1 tablet (20 mg total) by mouth 2 (two) times daily. Patient not taking: Reported on 04/26/2020 04/23/20   Bing Neighbors, FNP  ibuprofen (ADVIL) 800 MG tablet Take 1 tablet (800 mg total) by mouth 3 (three) times daily with meals. Patient not taking: Reported on 04/26/2020 01/31/20   Mardella Layman, MD  ondansetron (ZOFRAN-ODT) 4 MG disintegrating tablet Take 1 tablet (4 mg total) by mouth every 8 (eight) hours as needed for nausea or vomiting. 05/06/20   Horton, Mayer Masker, MD  oxyCODONE (OXY IR/ROXICODONE) 5 MG immediate release tablet Take 1 tablet (5 mg total) by mouth every 6 (six) hours as needed for severe pain. 05/15/20   Gaynelle Adu, MD  ferrous sulfate 325 (65 FE) MG tablet Take 1 tablet (325 mg total) by mouth 2 (two) times daily with a meal. Patient not taking: Reported on 06/09/2018 06/10/16 01/31/20  Thressa Sheller D, CNM  norgestimate-ethinyl estradiol (SPRINTEC  28) 0.25-35 MG-MCG tablet Take 1 tablet by mouth daily. Patient not taking: Reported on 06/09/2018 06/10/16 01/31/20  Thressa Sheller D, CNM    Allergies    Patient has no known allergies.  Review of Systems   Review of Systems  Constitutional: Negative for fever.  HENT: Negative for sore throat.   Eyes: Negative for visual disturbance.  Respiratory: Negative for cough and shortness of breath.   Cardiovascular: Positive for chest pain.  Gastrointestinal: Negative for abdominal pain, nausea and vomiting.  Genitourinary: Negative for difficulty urinating.  Musculoskeletal: Positive for arthralgias and myalgias. Negative for back pain and neck pain.  Skin: Negative for rash.  Neurological: Negative for syncope and headaches.    Physical Exam Updated Vital Signs BP (!) 149/98 (BP Location: Right Arm)   Pulse 69   Temp 98.6 F (37 C) (Oral)   Resp 18   SpO2 95%   Physical Exam Vitals and nursing note reviewed.  Constitutional:      General: She is not in acute distress.    Appearance: She is well-developed. She is not diaphoretic.  HENT:     Head: Normocephalic and atraumatic.  Eyes:     Conjunctiva/sclera: Conjunctivae normal.  Cardiovascular:     Rate and Rhythm: Normal rate and regular rhythm.     Heart sounds: Normal heart sounds. No murmur heard.  No friction rub. No gallop.   Pulmonary:     Effort: Pulmonary effort is normal. No respiratory distress.     Breath sounds: Normal breath sounds. No wheezing or rales.  Abdominal:     General: There is no distension.     Palpations: Abdomen is soft.     Tenderness: There is no abdominal tenderness. There is no guarding.  Musculoskeletal:        General: Tenderness (chest and arms) present.     Cervical back: Normal range of motion.  Skin:    General: Skin is warm and dry.     Findings: No erythema or rash.  Neurological:     Mental Status: She is alert and oriented to person, place, and time.     ED Results /  Procedures / Treatments   Labs (all labs ordered are listed, but only abnormal results are displayed) Labs Reviewed  BASIC METABOLIC PANEL - Abnormal; Notable for the following components:      Result Value   Glucose, Bld 107 (*)    All other components within normal limits  CBC  I-STAT BETA HCG BLOOD, ED ([facility], [facility], [facility] ONLY)  TROPONIN  I (HIGH SENSITIVITY)  TROPONIN I (HIGH SENSITIVITY)    EKG EKG Interpretation  Date/Time:  Tuesday August 21 2020 18:14:47 EST Ventricular Rate:  69 PR Interval:  176 QRS Duration: 88 QT Interval:  430 QTC Calculation: 460 R Axis:   102 Text Interpretation: Normal sinus rhythm Rightward axis Borderline ECG Nonspecific changes compared to prior Confirmed by Alvira Monday ([id number]) on 08/22/2020 2:16:20 AM   Radiology DG Chest 2 View  Result Date: 08/21/2020 CLINICAL DATA:  Bilateral arm and chest pain EXAM: CHEST - 2 VIEW COMPARISON:  None. FINDINGS: Normal mediastinum and cardiac silhouette. Normal pulmonary vasculature. No evidence of effusion, infiltrate, or pneumothorax. No acute bony abnormality. IMPRESSION: Normal chest radiograph. Electronically Signed   By: Genevive Bi M.D.   On: 08/21/2020 18:56    Procedures Procedures (including critical care time)  Medications Ordered in ED Medications  ketorolac (TORADOL) injection 60 mg (has no administration in time range)    ED Course  I have reviewed the triage vital signs and the nursing notes.  Pertinent labs & imaging results that were available during my care of the patient were reviewed by me and considered in my medical decision making (see chart for details).    MDM Rules/Calculators/A&P                          29 year old female with a history of hypertension per chart, anxiety presents with concern for bilateral arm and chest pain--sent from urgent care for above and hypertension.  EKG evaluated by me and shows nonspecific changes.  Delta troponins are both negative,  have low suspicion for ACS.  Chest x-ray shows no evidence of pneumonia, pneumothorax, or effusion.  Low suspicion for PE, dissection by history and exam.    History consistent with MSK related pain.  Discussed with patient that we did not obtain CK during triage and we could order it now to ensure no signs of rhabdo. She has been waiting for a long time and prefers not to wait more for this lab. No signs of compartment syndrome. During her last admission for rhabdo, her CK was mildly elevated 2500---she denies any darkening of urine, has normal Cr, and have low suspicion for acute rhabdomyolysis at this time.  Reports she has been staying hydrated. She understands reasons to return.   Given rx for flexeril, recommend PCP follow up, rest.  She is also noted to have blood pressures 210/116 on arrival to the urgent care, with spontaneous improvement down to 149/98 in the emergency department.  She has not otherwise have symptoms of blood pressure emergency.  Will give short prescription for amlodipine for blood pressures and recommend follow-up with primary care doctor.  Also reports BB above right eye-no signs of infection, she would like it removed-appropriate for nonemergent removal--will give rec to Plastic Surgery. Patient discharged in stable condition with understanding of reasons to return.     Final Clinical Impression(s) / ED Diagnoses Final diagnoses:  Muscle strain  Hypertension, unspecified type  Foreign body (FB) in soft tissue  Chest pain, unspecified type    Rx / DC Orders ED Discharge Orders         Ordered    amLODipine (NORVASC) 5 MG tablet  Daily        08/22/20 0246    cyclobenzaprine (FLEXERIL) 10 MG tablet  2 times daily PRN        08/22/20 0246  Alvira Monday, MD 08/22/20 [phone]

## 2020-09-19 ENCOUNTER — Ambulatory Visit (HOSPITAL_COMMUNITY)
Admission: EM | Admit: 2020-09-19 | Discharge: 2020-09-19 | Disposition: A | Discharge status: Home / Self Care | Payer: Medicaid Other | Attending: Emergency Medicine | Admitting: Emergency Medicine

## 2020-09-19 ENCOUNTER — Encounter (HOSPITAL_COMMUNITY): Payer: Self-pay

## 2020-09-19 DIAGNOSIS — R059 Cough, unspecified: Secondary | ICD-10-CM | POA: Insufficient documentation

## 2020-09-19 DIAGNOSIS — U071 COVID-19: Secondary | ICD-10-CM | POA: Diagnosis not present

## 2020-09-19 DIAGNOSIS — J069 Acute upper respiratory infection, unspecified: Secondary | ICD-10-CM | POA: Diagnosis not present

## 2020-09-19 DIAGNOSIS — R42 Dizziness and giddiness: Secondary | ICD-10-CM | POA: Insufficient documentation

## 2020-09-19 DIAGNOSIS — F17210 Nicotine dependence, cigarettes, uncomplicated: Secondary | ICD-10-CM | POA: Diagnosis not present

## 2020-09-19 LAB — RESP PANEL BY RT-PCR (FLU A&B, COVID) ARPGX2
Influenza A by PCR: NEGATIVE
Influenza B by PCR: NEGATIVE
SARS Coronavirus 2 by RT PCR: POSITIVE — AB

## 2020-09-19 LAB — POC URINE PREG, ED: Preg Test, Ur: NEGATIVE

## 2020-09-19 NOTE — ED Provider Notes (Signed)
[facility]    CSN: [id number] Arrival date & time: 09/19/20  0846      History   Chief Complaint Chief Complaint  Patient presents with  . Cough    HPI Stephanie Gutierrez is a 30 y.o. female.   Stephanie Gutierrez presents with complaints of headache, body aches, dry cough which started around two days ago. Yesterday felt dizzy while at work. No gi symptoms. No known ill contacts. Has taken advil pm to help with symptoms, with minimal relief. No history of covid-19 and has not received vaccination.     ROS per HPI, negative if not otherwise mentioned.      Past Medical History:  Diagnosis Date  . Anxiety   . Depression   . Gallstones   . Hypertension    not on meds   . Obesity     Patient Active Problem List   Diagnosis Date Noted  . Rhabdomyolysis 07/10/2014    Past Surgical History:  Procedure Laterality Date  . CHOLECYSTECTOMY N/A 05/15/2020   Procedure: LAPAROSCOPIC CHOLECYSTECTOMY WITH  INTRAOPERATIVE CHOLANGIOGRAM;  Surgeon: Gaynelle Adu, MD;  Location: [facility];  Service: General;  Laterality: N/A;  . NO PAST SURGERIES      OB History    Gravida  0   Para  0   Term  0   Preterm  0   AB  0   Living  0     SAB  0   IAB  0   Ectopic  0   Multiple  0   Live Births  0            Home Medications    Prior to Admission medications   Medication Sig Start Date End Date Taking? Authorizing Provider  amLODipine (NORVASC) 5 MG tablet Take 1 tablet (5 mg total) by mouth daily. 08/22/20  Yes Alvira Monday, MD  cyclobenzaprine (FLEXERIL) 10 MG tablet Take 1 tablet (10 mg total) by mouth 2 (two) times daily as needed for muscle spasms. 08/22/20   Alvira Monday, MD  famotidine (PEPCID) 20 MG tablet Take 1 tablet (20 mg total) by mouth 2 (two) times daily. Patient not taking: Reported on 04/26/2020 04/23/20   Bing Neighbors, FNP  ibuprofen (ADVIL) 800 MG tablet Take 1 tablet (800 mg total) by mouth 3 (three) times daily with meals. Patient  not taking: Reported on 04/26/2020 01/31/20   Mardella Layman, MD  ondansetron (ZOFRAN-ODT) 4 MG disintegrating tablet Take 1 tablet (4 mg total) by mouth every 8 (eight) hours as needed for nausea or vomiting. 05/06/20   Horton, Mayer Masker, MD  oxyCODONE (OXY IR/ROXICODONE) 5 MG immediate release tablet Take 1 tablet (5 mg total) by mouth every 6 (six) hours as needed for severe pain. 05/15/20   Gaynelle Adu, MD  ferrous sulfate 325 (65 FE) MG tablet Take 1 tablet (325 mg total) by mouth 2 (two) times daily with a meal. Patient not taking: Reported on 06/09/2018 06/10/16 01/31/20  Thressa Sheller D, CNM  norgestimate-ethinyl estradiol (SPRINTEC 28) 0.25-35 MG-MCG tablet Take 1 tablet by mouth daily. Patient not taking: Reported on 06/09/2018 06/10/16 01/31/20  Armando Reichert, CNM    Family History Family History  Problem Relation Age of Onset  . Hypertension Mother   . CAD Other   . Diabetes Mellitus II Other     Social History Social History   Tobacco Use  . Smoking status: Current Every Day Smoker    Packs/day: 0.25  Types: Cigarettes  . Smokeless tobacco: Never Used  Vaping Use  . Vaping Use: Never used  Substance Use Topics  . Alcohol use: Yes    Comment: rare  . Drug use: No     Allergies   Patient has no known allergies.   Review of Systems Review of Systems   Physical Exam Triage Vital Signs ED Triage Vitals  Enc Vitals Group     BP 09/19/20 1020 (!) 169/100     Pulse Rate 09/19/20 1020 100     Resp 09/19/20 1020 20     Temp 09/19/20 1020 98.2 F (36.8 C)     Temp Source 09/19/20 1020 Oral     SpO2 09/19/20 1020 100 %     Weight 09/19/20 1018 240 lb (108.9 kg)     Height 09/19/20 1018 5\' 4"  (1.626 m)     Head Circumference --      Peak Flow --      Pain Score 09/19/20 1018 10     Pain Loc --      Pain Edu? --      Excl. in [place]? --    No data found.  Updated Vital Signs BP (!) 169/100 (BP Location: Right Arm)   Pulse 100   Temp 98.2 F (36.8 C) (Oral)    Resp 20   Ht 5\' 4"  (1.626 m)   Wt 240 lb (108.9 kg)   LMP 06/22/2020 (Approximate)   SpO2 100%   BMI 41.20 kg/m   Visual Acuity Right Eye Distance:   Left Eye Distance:   Bilateral Distance:    Right Eye Near:   Left Eye Near:    Bilateral Near:     Physical Exam Constitutional:      General: She is not in acute distress.    Appearance: She is well-developed.  Cardiovascular:     Rate and Rhythm: Normal rate.  Pulmonary:     Effort: Pulmonary effort is normal.  Skin:    General: Skin is warm and dry.  Neurological:     Mental Status: She is alert and oriented to person, place, and time.      UC Treatments / Results  Labs (all labs ordered are listed, but only abnormal results are displayed) Labs Reviewed  RESP PANEL BY RT-PCR (FLU A&B, COVID) [id number]  POC URINE PREG, ED    EKG   Radiology No results found.  Procedures Procedures (including critical care time)  Medications Ordered in UC Medications - No data to display  Initial Impression / Assessment and Plan / UC Course  I have reviewed the triage vital signs and the nursing notes.  Pertinent labs & imaging results that were available during my care of the patient were reviewed by me and considered in my medical decision making (see chart for details).     Non toxic. Benign physical exam. History and physical consistent with viral illness.  Covid testing pending and isolation instructions provided.  Supportive cares recommended. Return precautions provided. Patient verbalized understanding and agreeable to plan.   Final Clinical Impressions(s) / UC Diagnoses   Final diagnoses:  Upper respiratory tract infection, unspecified type     Discharge Instructions     Push fluids to ensure adequate hydration and keep secretions thin.  Tylenol and/or ibuprofen as needed for pain or fevers.  Over the counter medications such as mucinex, robitussin, or delsym as needed for symptoms.  Self isolate until  covid results are back.  Will notify you  by phone of any positive findings. Your negative results will be sent through your MyChart.     If symptoms worsen or do not improve in the next week to return to be seen or to follow up with your PCP.     ED Prescriptions    None     PDMP not reviewed this encounter.   Georgetta Haber, NP 09/19/20 1038

## 2020-09-19 NOTE — Discharge Instructions (Signed)
Push fluids to ensure adequate hydration and keep secretions thin.  Tylenol and/or ibuprofen as needed for pain or fevers.  Over the counter medications such as mucinex, robitussin, or delsym as needed for symptoms.  Self isolate until covid results are back.  Will notify you by phone of any positive findings. Your negative results will be sent through your MyChart.     If symptoms worsen or do not improve in the next week to return to be seen or to follow up with your PCP.

## 2020-09-19 NOTE — ED Triage Notes (Signed)
Pt c/o productive cough with clear sputum, congestion, diarrhea, general body aches onset yesterday.   Denies fever, n/v, sore throat, ear ache.  Has taken Advil p.m. last night.  Tolerating po fluids. Has not taken COVID vaccines.

## 2020-11-27 ENCOUNTER — Institutional Professional Consult (permissible substitution): Payer: Medicaid Other | Admitting: Plastic Surgery

## 2021-05-14 ENCOUNTER — Ambulatory Visit (HOSPITAL_COMMUNITY)
Admission: EM | Admit: 2021-05-14 | Discharge: 2021-05-14 | Disposition: A | Discharge status: Home / Self Care | Payer: Medicaid Other | Attending: Physician Assistant | Admitting: Physician Assistant

## 2021-05-14 ENCOUNTER — Other Ambulatory Visit: Payer: Self-pay

## 2021-05-14 ENCOUNTER — Encounter (HOSPITAL_COMMUNITY): Payer: Self-pay | Admitting: Physician Assistant

## 2021-05-14 DIAGNOSIS — I10 Essential (primary) hypertension: Secondary | ICD-10-CM | POA: Diagnosis not present

## 2021-05-14 DIAGNOSIS — R1032 Left lower quadrant pain: Secondary | ICD-10-CM | POA: Diagnosis not present

## 2021-05-14 DIAGNOSIS — R1030 Lower abdominal pain, unspecified: Secondary | ICD-10-CM

## 2021-05-14 DIAGNOSIS — I160 Hypertensive urgency: Secondary | ICD-10-CM | POA: Diagnosis not present

## 2021-05-14 DIAGNOSIS — R1031 Right lower quadrant pain: Secondary | ICD-10-CM

## 2021-05-14 LAB — POCT URINALYSIS DIPSTICK, ED / UC
Bilirubin Urine: NEGATIVE
Glucose, UA: NEGATIVE mg/dL
Ketones, ur: NEGATIVE mg/dL
Leukocytes,Ua: NEGATIVE
Nitrite: NEGATIVE
Protein, ur: NEGATIVE mg/dL
Specific Gravity, Urine: 1.02 (ref 1.005–1.030)
Urobilinogen, UA: 0.2 mg/dL (ref 0.0–1.0)
pH: 7 (ref 5.0–8.0)

## 2021-05-14 LAB — POC URINE PREG, ED: Preg Test, Ur: NEGATIVE

## 2021-05-14 NOTE — ED Notes (Signed)
Patient is being discharged from the Urgent Care and sent to the Emergency Department via POV. Per Evern Core PA, patient is in need of higher level of care due to need of further evaluation. Patient is aware and verbalizes understanding of plan of care.  Vitals:   05/14/21 1619 05/14/21 1747  BP: (!) 176/107 (!) 181/99  Pulse: 80 83  Resp: 20 20  Temp: 98.3 F (36.8 C)   SpO2: 99% 100%

## 2021-05-14 NOTE — ED Provider Notes (Signed)
[facility]    CSN: [id number] Arrival date & time: 05/14/21  1519      History   Chief Complaint Chief Complaint  Patient presents with   Abdominal Cramping    HPI Stephanie Gutierrez is a 30 y.o. female.   Patient here c/w abdominal pain x 3 days, worse today.  Pain 9/10, lower abdominal pain, does not radiate.  Denies f/c, n/v/d/c, hematochezia, melena, GU sx.  No advil or tylenol today.     Past Medical History:  Diagnosis Date   Anxiety    Depression    Gallstones    Hypertension    not on meds    Obesity     Patient Active Problem List   Diagnosis Date Noted   Rhabdomyolysis 07/10/2014    Past Surgical History:  Procedure Laterality Date   CHOLECYSTECTOMY N/A 05/15/2020   Procedure: LAPAROSCOPIC CHOLECYSTECTOMY WITH  INTRAOPERATIVE CHOLANGIOGRAM;  Surgeon: Gaynelle Adu, MD;  Location: [facility];  Service: General;  Laterality: N/A;   NO PAST SURGERIES      OB History     Gravida  0   Para  0   Term  0   Preterm  0   AB  0   Living  0      SAB  0   IAB  0   Ectopic  0   Multiple  0   Live Births  0            Home Medications    Prior to Admission medications   Medication Sig Start Date End Date Taking? Authorizing Provider  amLODipine (NORVASC) 5 MG tablet Take 1 tablet (5 mg total) by mouth daily. 08/22/20   Alvira Monday, MD  cyclobenzaprine (FLEXERIL) 10 MG tablet Take 1 tablet (10 mg total) by mouth 2 (two) times daily as needed for muscle spasms. 08/22/20   Alvira Monday, MD  famotidine (PEPCID) 20 MG tablet Take 1 tablet (20 mg total) by mouth 2 (two) times daily. Patient not taking: Reported on 04/26/2020 04/23/20   Bing Neighbors, FNP  ibuprofen (ADVIL) 800 MG tablet Take 1 tablet (800 mg total) by mouth 3 (three) times daily with meals. Patient not taking: Reported on 04/26/2020 01/31/20   Mardella Layman, MD  ondansetron (ZOFRAN-ODT) 4 MG disintegrating tablet Take 1 tablet (4 mg total) by mouth every 8 (eight)  hours as needed for nausea or vomiting. 05/06/20   Horton, Mayer Masker, MD  oxyCODONE (OXY IR/ROXICODONE) 5 MG immediate release tablet Take 1 tablet (5 mg total) by mouth every 6 (six) hours as needed for severe pain. 05/15/20   Gaynelle Adu, MD  ferrous sulfate 325 (65 FE) MG tablet Take 1 tablet (325 mg total) by mouth 2 (two) times daily with a meal. Patient not taking: Reported on 06/09/2018 06/10/16 01/31/20  Thressa Sheller D, CNM  norgestimate-ethinyl estradiol (SPRINTEC 28) 0.25-35 MG-MCG tablet Take 1 tablet by mouth daily. Patient not taking: Reported on 06/09/2018 06/10/16 01/31/20  Armando Reichert, CNM    Family History Family History  Problem Relation Age of Onset   Hypertension Mother    CAD Other    Diabetes Mellitus II Other     Social History Social History   Tobacco Use   Smoking status: Every Day    Packs/day: 0.25    Types: Cigarettes   Smokeless tobacco: Never  Vaping Use   Vaping Use: Never used  Substance Use Topics   Alcohol use: Yes  Comment: rare   Drug use: No     Allergies   Patient has no known allergies.   Review of Systems Review of Systems  Constitutional:  Negative for chills, fatigue and fever.  HENT:  Negative for congestion, ear pain, nosebleeds, postnasal drip, rhinorrhea, sinus pressure, sinus pain and sore throat.   Eyes:  Negative for pain and redness.  Respiratory:  Negative for cough, shortness of breath and wheezing.   Gastrointestinal:  Positive for abdominal pain. Negative for diarrhea, nausea and vomiting.  Genitourinary:  Negative for dysuria, hematuria, menstrual problem, pelvic pain, urgency, vaginal bleeding, vaginal discharge and vaginal pain.  Musculoskeletal:  Negative for arthralgias and myalgias.  Skin:  Negative for rash.  Neurological:  Negative for light-headedness and headaches.  Hematological:  Negative for adenopathy. Does not bruise/bleed easily.  Psychiatric/Behavioral:  Negative for confusion and sleep  disturbance.     Physical Exam Triage Vital Signs ED Triage Vitals  Enc Vitals Group     BP 05/14/21 1619 (!) 176/107     Pulse Rate 05/14/21 1619 80     Resp 05/14/21 1619 20     Temp 05/14/21 1619 98.3 F (36.8 C)     Temp Source 05/14/21 1619 Oral     SpO2 05/14/21 1619 99 %     Weight --      Height --      Head Circumference --      Peak Flow --      Pain Score 05/14/21 1621 10     Pain Loc --      Pain Edu? --      Excl. in [place]? --    No data found.  Updated Vital Signs BP (!) 181/99 (BP Location: Left Arm)   Pulse 83   Temp 98.3 F (36.8 C) (Oral)   Resp 20   LMP  (LMP Unknown)   SpO2 100%   Visual Acuity Right Eye Distance:   Left Eye Distance:   Bilateral Distance:    Right Eye Near:   Left Eye Near:    Bilateral Near:     Physical Exam Vitals and nursing note reviewed.  Constitutional:      General: She is not in acute distress.    Appearance: Normal appearance. She is not ill-appearing.  HENT:     Head: Normocephalic and atraumatic.     Right Ear: Tympanic membrane and ear canal normal.     Left Ear: Tympanic membrane and ear canal normal.     Nose: No congestion or rhinorrhea.     Mouth/Throat:     Pharynx: No oropharyngeal exudate or posterior oropharyngeal erythema.  Eyes:     General: No scleral icterus.    Extraocular Movements: Extraocular movements intact.     Conjunctiva/sclera: Conjunctivae normal.  Cardiovascular:     Rate and Rhythm: Normal rate and regular rhythm.     Heart sounds: No murmur heard. Pulmonary:     Effort: Pulmonary effort is normal. No respiratory distress.     Breath sounds: Normal breath sounds. No wheezing or rales.  Abdominal:     Tenderness: There is abdominal tenderness in the right lower quadrant and left lower quadrant. There is rebound. There is no guarding. Negative signs include Murphy's sign, Rovsing's sign, McBurney's sign and obturator sign.  Musculoskeletal:     Cervical back: Normal range of  motion. No rigidity.  Skin:    Capillary Refill: Capillary refill takes less than 2 seconds.  Coloration: Skin is not jaundiced.     Findings: No rash.  Neurological:     General: No focal deficit present.     Mental Status: She is alert and oriented to person, place, and time.     Motor: No weakness.     Gait: Gait normal.  Psychiatric:        Mood and Affect: Mood normal.        Behavior: Behavior normal.     UC Treatments / Results  Labs (all labs ordered are listed, but only abnormal results are displayed) Labs Reviewed  POCT URINALYSIS DIPSTICK, ED / UC - Abnormal; Notable for the following components:      Result Value   Hgb urine dipstick LARGE (*)    All other components within normal limits  POC URINE PREG, ED    EKG   Radiology No results found.  Procedures Procedures (including critical care time)  Medications Ordered in UC Medications - No data to display  Initial Impression / Assessment and Plan / UC Course  I have reviewed the triage vital signs and the nursing notes.  Pertinent labs & imaging results that were available during my care of the patient were reviewed by me and considered in my medical decision making (see chart for details).     Lower abdominal pain, sent to ED for further evaluation HTN urgency Final Clinical Impressions(s) / UC Diagnoses   Final diagnoses:  Lower abdominal pain  Hypertensive urgency     Discharge Instructions      Sent to ED for further evaluation of abdominal pain     ED Prescriptions   None    PDMP not reviewed this encounter.   Evern Core, PA-C 05/14/21 1610

## 2021-05-14 NOTE — Discharge Instructions (Addendum)
Sent to ED for further evaluation of abdominal pain

## 2021-05-14 NOTE — ED Triage Notes (Signed)
Pt reports ABD cramping  for 3 days.

## 2021-05-16 ENCOUNTER — Ambulatory Visit (HOSPITAL_COMMUNITY)
Admission: EM | Admit: 2021-05-16 | Discharge: 2021-05-16 | Disposition: A | Discharge status: Home / Self Care | Payer: Medicaid Other | Attending: Family Medicine | Admitting: Family Medicine

## 2021-05-16 ENCOUNTER — Encounter (HOSPITAL_COMMUNITY): Payer: Self-pay

## 2021-05-16 ENCOUNTER — Other Ambulatory Visit: Payer: Self-pay

## 2021-05-16 DIAGNOSIS — I10 Essential (primary) hypertension: Secondary | ICD-10-CM

## 2021-05-16 DIAGNOSIS — R220 Localized swelling, mass and lump, head: Secondary | ICD-10-CM | POA: Diagnosis not present

## 2021-05-16 MED ORDER — PREDNISONE 20 MG PO TABS: 40.0000 mg | ORAL_TABLET | Freq: Every day | ORAL | 0 refills | Status: DC

## 2021-05-16 NOTE — Discharge Instructions (Addendum)
Your blood pressure was noted to be elevated during your visit today. If you are currently taking medication for high blood pressure, please ensure you are taking this as directed. If you do not have a history of high blood pressure and your blood pressure remains persistently elevated, you may need to begin taking a medication at some point. You may return here within the next few days to recheck if unable to see your primary care provider or if you do not have a one.  BP (!) 152/102 (BP Location: Right Arm)   Pulse 84   Temp 98.6 F (37 C) (Oral)   Resp 20   LMP  (LMP Unknown)   SpO2 100%   BP Readings from Last 3 Encounters:  05/16/21 (!) 152/102  05/14/21 (!) 181/99  09/19/20 (!) 169/100

## 2021-05-16 NOTE — ED Provider Notes (Signed)
[facility]   [id number] 05/16/21 Arrival Time: 1354  ASSESSMENT & PLAN:  1. Facial swelling   2. Elevated blood pressure reading in office with diagnosis of hypertension    No swallowing or resp difficulties. Unclear trigger. She is comfortable with home observation. Begin: Meds ordered this encounter  Medications   predniSONE (DELTASONE) 20 MG tablet    Sig: Take 2 tablets (40 mg total) by mouth daily.    Dispense:  10 tablet    Refill:  0     Discharge Instructions      Your blood pressure was noted to be elevated during your visit today. If you are currently taking medication for high blood pressure, please ensure you are taking this as directed. If you do not have a history of high blood pressure and your blood pressure remains persistently elevated, you may need to begin taking a medication at some point. You may return here within the next few days to recheck if unable to see your primary care provider or if you do not have a one.  BP (!) 152/102 (BP Location: Right Arm)   Pulse 84   Temp 98.6 F (37 C) (Oral)   Resp 20   LMP  (LMP Unknown)   SpO2 100%   BP Readings from Last 3 Encounters:  05/16/21 (!) 152/102  05/14/21 (!) 181/99  09/19/20 (!) 169/100          Follow-up Information     [facility] EMERGENCY DEPARTMENT.   Specialty: Emergency Medicine Why: If symptoms worsen in any way. Contact information: [address] [id number] mc [place] [address] [phone]                Reviewed expectations re: course of current medical issues. Questions answered. Outlined signs and symptoms indicating need for more acute intervention. Understanding verbalized. After Visit Summary given.   SUBJECTIVE: History from: patient. Stephanie Gutierrez is a 30 y.o. female who reports lower facial swelling and itching; 'hives' this morning on lower face. No swallowing or resp difficulties. No new medications.  No h/o similar. No tx PTA. Denies: sore throat. Normal PO intake without n/v/d. Increased blood pressure noted today. Reports that she is treated for HTN. She reports no chest pain on exertion, no dyspnea on exertion, no swelling of ankles, no orthostatic dizziness or lightheadedness, no orthopnea or paroxysmal nocturnal dyspnea, and no palpitations.   OBJECTIVE:  Vitals:   05/16/21 1436  BP: (!) 152/102  Pulse: 84  Resp: 20  Temp: 98.6 F (37 C)  TempSrc: Oral  SpO2: 100%    General appearance: alert; no distress Eyes: PERRLA; EOMI; conjunctiva normal HENT: [place]; AT; without nasal congestion Neck: supple  Lungs: speaks full sentences without difficulty; unlabored Extremities: no edema Skin: warm and dry; some wheals over lower face; mild swelling including lower lip Neurologic: normal gait Psychological: alert and cooperative; normal mood and affect   No Known Allergies  Past Medical History:  Diagnosis Date   Anxiety    Depression    Gallstones    Hypertension    not on meds    Obesity    Social History   Socioeconomic History   Marital status: Single    Spouse name: Not on file   Number of children: 0   Years of education: 12   Highest education level: Not on file  Occupational History   Not on file  Tobacco Use   Smoking status: Every Day  Packs/day: 0.25    Types: Cigarettes   Smokeless tobacco: Never  Vaping Use   Vaping Use: Never used  Substance and Sexual Activity   Alcohol use: Yes    Comment: rare   Drug use: No   Sexual activity: Not Currently  Other Topics Concern   Not on file  Social History Narrative   Not on file   Social Determinants of Health   Financial Resource Strain: Not on file  Food Insecurity: Not on file  Transportation Needs: Not on file  Physical Activity: Not on file  Stress: Not on file  Social Connections: Not on file  Intimate Partner Violence: Not on file   Family History  Problem Relation Age of Onset    Hypertension Mother    CAD Other    Diabetes Mellitus II Other    Past Surgical History:  Procedure Laterality Date   CHOLECYSTECTOMY N/A 05/15/2020   Procedure: LAPAROSCOPIC CHOLECYSTECTOMY WITH  INTRAOPERATIVE CHOLANGIOGRAM;  Surgeon: Gaynelle Adu, MD;  Location: [facility];  Service: General;  Laterality: N/A;   NO PAST SURGERIES       Mardella Layman, MD 05/16/21 1601

## 2021-05-16 NOTE — ED Triage Notes (Signed)
Pt reports facial swelling and hives in the face since 1100 am today. Pt denies throat tightness; difficulty breathing, talking, swallowing. Pt has not taken any meds for complaint.

## 2021-06-04 ENCOUNTER — Other Ambulatory Visit: Payer: Self-pay

## 2021-06-04 ENCOUNTER — Ambulatory Visit (HOSPITAL_COMMUNITY)
Admission: EM | Admit: 2021-06-04 | Discharge: 2021-06-04 | Disposition: A | Discharge status: Home / Self Care | Payer: Medicaid Other | Attending: Internal Medicine | Admitting: Internal Medicine

## 2021-06-04 ENCOUNTER — Encounter (HOSPITAL_COMMUNITY): Payer: Self-pay

## 2021-06-04 DIAGNOSIS — M79602 Pain in left arm: Secondary | ICD-10-CM | POA: Diagnosis not present

## 2021-06-04 DIAGNOSIS — I10 Essential (primary) hypertension: Secondary | ICD-10-CM | POA: Diagnosis not present

## 2021-06-04 NOTE — ED Provider Notes (Signed)
[facility]    CSN: [id number] Arrival date & time: 06/04/21  1604      History   Chief Complaint Chief Complaint  Patient presents with   Arm Injury   Hypertension    HPI Stephanie Gutierrez is a 30 y.o. female.   Left Elbow Pain 2 days ago was helping her sister move when she hit the back of her left upper arm on a dresser States that she was able to move and continue lifting things, however over the last few days has had worsening soreness She states that she is still able to move her elbow, but has pain in the region of her triceps She was unable to go to work today due to the pain She denies any numbness and tingling States that she is able to move her shoulder as well without difficulty  Hypertension Patient reports no prior diagnosis of hypertension, but states that she has been having elevated blood pressures every time that she checks for quite some time She does not have a PCP She does report some chest tightness currently, which she relates to anxiety She denies any shortness of breath, back pain, difficulty urinating, numbness and tingling, focal weakness, speech slurring, facial droop, confusion She is requesting a referral for primary care provider today to help her manage her hypertension   Past Medical History:  Diagnosis Date   Anxiety    Depression    Gallstones    Hypertension    not on meds    Obesity     Patient Active Problem List   Diagnosis Date Noted   Rhabdomyolysis 07/10/2014    Past Surgical History:  Procedure Laterality Date   CHOLECYSTECTOMY N/A 05/15/2020   Procedure: LAPAROSCOPIC CHOLECYSTECTOMY WITH  INTRAOPERATIVE CHOLANGIOGRAM;  Surgeon: Gaynelle Adu, MD;  Location: [facility];  Service: General;  Laterality: N/A;   NO PAST SURGERIES      OB History     Gravida  0   Para  0   Term  0   Preterm  0   AB  0   Living  0      SAB  0   IAB  0   Ectopic  0   Multiple  0   Live Births  0             Home Medications    Prior to Admission medications   Medication Sig Start Date End Date Taking? Authorizing Provider  acetaminophen (TYLENOL) 500 MG tablet Take 500 mg by mouth every 6 (six) hours as needed.    [provider]  amLODipine (NORVASC) 5 MG tablet Take 1 tablet (5 mg total) by mouth daily. 08/22/20   Alvira Monday, MD  cyclobenzaprine (FLEXERIL) 10 MG tablet Take 1 tablet (10 mg total) by mouth 2 (two) times daily as needed for muscle spasms. 08/22/20   Alvira Monday, MD  famotidine (PEPCID) 20 MG tablet Take 1 tablet (20 mg total) by mouth 2 (two) times daily. Patient not taking: No sig reported 04/23/20   Bing Neighbors, FNP  ibuprofen (ADVIL) 800 MG tablet Take 1 tablet (800 mg total) by mouth 3 (three) times daily with meals. Patient not taking: No sig reported 01/31/20   Mardella Layman, MD  ondansetron (ZOFRAN-ODT) 4 MG disintegrating tablet Take 1 tablet (4 mg total) by mouth every 8 (eight) hours as needed for nausea or vomiting. 05/06/20   Horton, Mayer Masker, MD  oxyCODONE (OXY IR/ROXICODONE) 5 MG immediate release tablet  Take 1 tablet (5 mg total) by mouth every 6 (six) hours as needed for severe pain. 05/15/20   Gaynelle Adu, MD  predniSONE (DELTASONE) 20 MG tablet Take 2 tablets (40 mg total) by mouth daily. 05/16/21   Mardella Layman, MD  ferrous sulfate 325 (65 FE) MG tablet Take 1 tablet (325 mg total) by mouth 2 (two) times daily with a meal. Patient not taking: Reported on 06/09/2018 06/10/16 01/31/20  Thressa Sheller D, CNM  norgestimate-ethinyl estradiol (SPRINTEC 28) 0.25-35 MG-MCG tablet Take 1 tablet by mouth daily. Patient not taking: Reported on 06/09/2018 06/10/16 01/31/20  Armando Reichert, CNM    Family History Family History  Problem Relation Age of Onset   Hypertension Mother    Obesity Mother    Hypertension Father    CAD Other    Diabetes Mellitus II Other     Social History Social History   Tobacco Use   Smoking status: Every Day     Packs/day: 0.25    Types: Cigarettes   Smokeless tobacco: Never  Vaping Use   Vaping Use: Never used  Substance Use Topics   Alcohol use: Yes   Drug use: No     Allergies   Patient has no known allergies.   Review of Systems Review of Systems  All other systems reviewed and are negative.  Per HPI Physical Exam Triage Vital Signs ED Triage Vitals  Enc Vitals Group     BP 06/04/21 1758 (!) 170/100     Pulse Rate 06/04/21 1758 78     Resp 06/04/21 1758 19     Temp 06/04/21 1758 98.2 F (36.8 C)     Temp Source 06/04/21 1758 Oral     SpO2 06/04/21 1758 100 %     Weight --      Height --      Head Circumference --      Peak Flow --      Pain Score 06/04/21 1754 8     Pain Loc --      Pain Edu? --      Excl. in GC? --    No data found.  Updated Vital Signs BP (!) 170/100 (BP Location: Right Arm)   Pulse 78   Temp 98.2 F (36.8 C) (Oral)   Resp 19   LMP 05/24/2021 (Approximate)   SpO2 100%   Visual Acuity Right Eye Distance:   Left Eye Distance:   Bilateral Distance:    Right Eye Near:   Left Eye Near:    Bilateral Near:     Physical Exam Constitutional:      General: She is not in acute distress.    Appearance: She is not ill-appearing or toxic-appearing.  HENT:     Head: Normocephalic and atraumatic.     Mouth/Throat:     Mouth: Mucous membranes are moist.  Eyes:     Extraocular Movements: Extraocular movements intact.     Conjunctiva/sclera: Conjunctivae normal.     Pupils: Pupils are equal, round, and reactive to light.  Cardiovascular:     Rate and Rhythm: Normal rate and regular rhythm.     Heart sounds: No murmur heard.   No friction rub. No gallop.  Pulmonary:     Effort: Pulmonary effort is normal.     Breath sounds: Normal breath sounds. No wheezing, rhonchi or rales.  Abdominal:     Palpations: Abdomen is soft.     Tenderness: There is no right CVA tenderness.  Musculoskeletal:  Comments: 5/5 strength BUE/BLE Left tricep  with mild pain and swelling, no TTP along humerus or at olecranon, full ROM at shoulder and elbow on left NV Intact distally  Skin:    General: Skin is warm and dry.     Capillary Refill: Capillary refill takes less than 2 seconds.  Neurological:     General: No focal deficit present.     Mental Status: She is alert and oriented to person, place, and time.     Cranial Nerves: No cranial nerve deficit.     Sensory: No sensory deficit.     Gait: Gait normal.  Psychiatric:        Mood and Affect: Mood normal.        Behavior: Behavior normal.        Thought Content: Thought content normal.        Judgment: Judgment normal.     UC Treatments / Results  Labs (all labs ordered are listed, but only abnormal results are displayed) Labs Reviewed - No data to display  EKG   Radiology No results found.  Procedures Procedures (including critical care time)  Medications Ordered in UC Medications - No data to display  Initial Impression / Assessment and Plan / UC Course  I have reviewed the triage vital signs and the nursing notes.  Pertinent labs & imaging results that were available during my care of the patient were reviewed by me and considered in my medical decision making (see chart for details).     Patient is a 30 year old female who presents with hypertension, she does report some chest tightness.  Advised that she would need to go to the emergency room for further evaluation, however she declined.  Did obtain an EKG that is normal sinus rhythm, but advised her that this cannot rule out significant cardiac problem especially in the setting of not being able to perform troponins here.  Again advised that she follow-up with the emergency room, but she again declined.  Advised that risks can include death if not treated appropriately, but she voiced understanding.  Will refer to her primary care provider so that she can be treated for hypertension.  She was given ED precautions  including worsening of chest pain, difficulty breathing, focal weakness, numbness and tingling, facial droop, speech slurring, back pain, difficulty urinating, visual changes.  In regards to her elbow, I did offer an x-ray, however she declined.  She can take Tylenol as needed for the pain.  She can follow-up as needed with her new primary care provider.  She was discharged home in stable condition.   Final Clinical Impressions(s) / UC Diagnoses   Final diagnoses:  Left arm pain  Severe hypertension     Discharge Instructions      As we discussed, I would be the safest thing for you to go to the emergency room given that you are having some chest discomfort with your very high blood pressure.  Especially if this worsens, he should be evaluated at the emergency room.  Go to the emergency room immediately if you have worsening in your chest pain, shortness of breath, difficulty urinating, back pain, visual changes, numbness and tingling on 1 side of your body or the other, weakness on 1 side of your body or the other, numbness and tingling in your face, facial droop, difficulty speaking.  In regards to your arm, this is likely a bruise and should improve over time.  You can follow-up with your new primary  care provider if this is not improving over the next few weeks.     ED Prescriptions   None    PDMP not reviewed this encounter.   Unknown Jim, DO 06/04/21 1843

## 2021-06-04 NOTE — Discharge Instructions (Addendum)
As we discussed, I would be the safest thing for you to go to the emergency room given that you are having some chest discomfort with your very high blood pressure.  Especially if this worsens, he should be evaluated at the emergency room.  Go to the emergency room immediately if you have worsening in your chest pain, shortness of breath, difficulty urinating, back pain, visual changes, numbness and tingling on 1 side of your body or the other, weakness on 1 side of your body or the other, numbness and tingling in your face, facial droop, difficulty speaking.  In regards to your arm, this is likely a bruise and should improve over time.  You can follow-up with your new primary care provider if this is not improving over the next few weeks.

## 2021-06-04 NOTE — ED Triage Notes (Signed)
Pt is here with a left arm injury after knocking it into a dresser 2 days ago, pt also has a headache. Pt states the headache could be to her blood pressure being high but states she has not been diagnosed with hypertension. Pt has taken Tylenol to relieve discomfort.

## 2021-07-07 ENCOUNTER — Telehealth: Payer: Medicaid Other | Admitting: Family

## 2021-07-07 DIAGNOSIS — F17200 Nicotine dependence, unspecified, uncomplicated: Secondary | ICD-10-CM | POA: Diagnosis not present

## 2021-07-07 DIAGNOSIS — U071 COVID-19: Secondary | ICD-10-CM | POA: Diagnosis not present

## 2021-07-07 MED ORDER — MOLNUPIRAVIR EUA 200MG CAPSULE: 4.0000 | ORAL_CAPSULE | Freq: Two times a day (BID) | ORAL | 0 refills | Status: AC

## 2021-07-07 NOTE — Progress Notes (Signed)
Virtual Visit Consent   Stephanie Gutierrez, you are scheduled for a virtual visit with a [place] provider today.     Just as with appointments in the office, your consent must be obtained to participate.  Your consent will be active for this visit and any virtual visit you may have with one of our providers in the next 365 days.     If you have a MyChart account, a copy of this consent can be sent to you electronically.  All virtual visits are billed to your insurance company just like a traditional visit in the office.    As this is a virtual visit, video technology does not allow for your provider to perform a traditional examination.  This may limit your provider's ability to fully assess your condition.  If your provider identifies any concerns that need to be evaluated in person or the need to arrange testing (such as labs, EKG, etc.), we will make arrangements to do so.     Although advances in technology are sophisticated, we cannot ensure that it will always work on either your end or our end.  If the connection with a video visit is poor, the visit may have to be switched to a telephone visit.  With either a video or telephone visit, we are not always able to ensure that we have a secure connection.     I need to obtain your verbal consent now.   Are you willing to proceed with your visit today?    Stephanie Gutierrez has provided verbal consent on 07/07/2021 for a virtual visit (video or telephone).   Stephanie Rodney, FNP   Date: 07/07/2021 7:09 PM   Virtual Visit via Video Note   I, Stephanie Gutierrez, connected with  Stephanie Gutierrez  ([id number], 1991-01-01) on 07/07/21 at  7:15 PM EDT by a video-enabled telemedicine application and verified that I am speaking with the correct person using two identifiers.  Location: Patient: Virtual Visit Location Patient: Home Provider: Virtual Visit Location Provider: Home   I discussed the limitations of evaluation and management by telemedicine and the  availability of in person appointments. The patient expressed understanding and agreed to proceed.    History of Present Illness: Stephanie Gutierrez is a 30 y.o. who identifies as a female who was assigned female at birth, and is being seen today for exposed to COVID and having flu like symptoms that started three days ago.   HPI: Cough This is a new problem. The current episode started in the past 7 days. The problem has been gradually worsening. The problem occurs every few minutes. The cough is Non-productive. Associated symptoms include chills, a fever, headaches, myalgias, nasal congestion, postnasal drip and rhinorrhea. Pertinent negatives include no ear congestion, ear pain, shortness of breath or wheezing. Associated symptoms comments: Nausea . Risk factors for lung disease include smoking/tobacco exposure. She has tried rest and OTC cough suppressant for the symptoms. The treatment provided mild relief.   Problems:  Patient Active Problem List   Diagnosis Date Noted   Rhabdomyolysis 07/10/2014    Allergies: No Known Allergies Medications:  Current Outpatient Medications:    molnupiravir EUA (LAGEVRIO) 200 mg CAPS capsule, Take 4 capsules (800 mg total) by mouth 2 (two) times daily for 5 days., Disp: 40 capsule, Rfl: 0   acetaminophen (TYLENOL) 500 MG tablet, Take 500 mg by mouth every 6 (six) hours as needed., Disp: , Rfl:    amLODipine (NORVASC) 5 MG tablet, Take 1 tablet (5  mg total) by mouth daily., Disp: 30 tablet, Rfl: 0  Observations/Objective: Patient is well-developed, well-nourished in no acute distress.  Resting comfortably  at home.  Head is normocephalic, atraumatic.  No labored breathing.  Speech is clear and coherent with logical content.  Patient is alert and oriented at baseline.    Assessment and Plan: 1. COVID-19 - molnupiravir EUA (LAGEVRIO) 200 mg CAPS capsule; Take 4 capsules (800 mg total) by mouth 2 (two) times daily for 5 days.  Dispense: 40 capsule; Refill:  0  2. Current smoker - molnupiravir EUA (LAGEVRIO) 200 mg CAPS capsule; Take 4 capsules (800 mg total) by mouth 2 (two) times daily for 5 days.  Dispense: 40 capsule; Refill: 0 COVID positive, rest, force fluids, tylenol as needed, Quarantine for at least 5 days and you are fever free, then must wear a mask out in public from day 6-10, report any worsening symptoms such as increased shortness of breath, swelling, or continued high fevers. Possible adverse effects discussed with antivirals.    Follow Up Instructions: I discussed the assessment and treatment plan with the patient. The patient was provided an opportunity to ask questions and all were answered. The patient agreed with the plan and demonstrated an understanding of the instructions.  A copy of instructions were sent to the patient via MyChart unless otherwise noted below.     The patient was advised to call back or seek an in-person evaluation if the symptoms worsen or if the condition fails to improve as anticipated.  Time:  I spent 10 minutes with the patient via telehealth technology discussing the above problems/concerns.    Stephanie Rodney, FNP

## 2021-07-11 ENCOUNTER — Other Ambulatory Visit: Payer: Self-pay

## 2021-07-11 ENCOUNTER — Ambulatory Visit (HOSPITAL_COMMUNITY)
Admission: EM | Admit: 2021-07-11 | Discharge: 2021-07-11 | Disposition: A | Discharge status: Home / Self Care | Payer: Medicaid Other | Attending: Student | Admitting: Student

## 2021-07-11 ENCOUNTER — Encounter (HOSPITAL_COMMUNITY): Payer: Self-pay

## 2021-07-11 DIAGNOSIS — J069 Acute upper respiratory infection, unspecified: Secondary | ICD-10-CM | POA: Insufficient documentation

## 2021-07-11 DIAGNOSIS — I10 Essential (primary) hypertension: Secondary | ICD-10-CM | POA: Diagnosis not present

## 2021-07-11 DIAGNOSIS — Z1152 Encounter for screening for COVID-19: Secondary | ICD-10-CM | POA: Insufficient documentation

## 2021-07-11 MED ORDER — LOPERAMIDE HCL 2 MG PO CAPS: 2.0000 mg | ORAL_CAPSULE | Freq: Four times a day (QID) | ORAL | 0 refills | Status: DC | PRN

## 2021-07-11 MED ORDER — PROMETHAZINE-DM 6.25-15 MG/5ML PO SYRP: 5.0000 mL | ORAL_SOLUTION | Freq: Four times a day (QID) | ORAL | 0 refills | Status: DC | PRN

## 2021-07-11 NOTE — ED Provider Notes (Signed)
[facility]    CSN: [id number] Arrival date & time: 07/11/21  1337      History   Chief Complaint Chief Complaint  Patient presents with   Generalized Body Aches   Headache    HPI Stephanie Gutierrez is a 30 y.o. female presenting with viral syndrome x6 days following exposure to covid. medical history noncontributory. Describes nonproductive cough, generalized bodyaches, nasal congestion, watery diarrhea without abd pain or vomiting.  This patient actually had a televisit on day 2 of her illness and was prescribed Molnupiravir which she has not picked up.  She also has not picked up her blood pressure medication in about 5 days, though she denies headaches, chest pain, shortness of breath, dizziness today.  Has been trying an over-the-counter cough medicine for the symptoms.  HPI  Past Medical History:  Diagnosis Date   Anxiety    Depression    Gallstones    Hypertension    not on meds    Obesity     Patient Active Problem List   Diagnosis Date Noted   Rhabdomyolysis 07/10/2014    Past Surgical History:  Procedure Laterality Date   CHOLECYSTECTOMY N/A 05/15/2020   Procedure: LAPAROSCOPIC CHOLECYSTECTOMY WITH  INTRAOPERATIVE CHOLANGIOGRAM;  Surgeon: Gaynelle Adu, MD;  Location: [facility];  Service: General;  Laterality: N/A;   NO PAST SURGERIES      OB History     Gravida  0   Para  0   Term  0   Preterm  0   AB  0   Living  0      SAB  0   IAB  0   Ectopic  0   Multiple  0   Live Births  0            Home Medications    Prior to Admission medications   Medication Sig Start Date End Date Taking? Authorizing Provider  loperamide (IMODIUM) 2 MG capsule Take 1 capsule (2 mg total) by mouth 4 (four) times daily as needed for diarrhea or loose stools. 07/11/21  Yes Rhys Martini, PA-C  promethazine-dextromethorphan (PROMETHAZINE-DM) 6.25-15 MG/5ML syrup Take 5 mLs by mouth 4 (four) times daily as needed for cough. 07/11/21  Yes Rhys Martini, PA-C  acetaminophen (TYLENOL) 500 MG tablet Take 500 mg by mouth every 6 (six) hours as needed.    [provider]  amLODipine (NORVASC) 5 MG tablet Take 1 tablet (5 mg total) by mouth daily. 08/22/20   Alvira Monday, MD  molnupiravir EUA (LAGEVRIO) 200 mg CAPS capsule Take 4 capsules (800 mg total) by mouth 2 (two) times daily for 5 days. 07/07/21 07/12/21  Junie Spencer, FNP  ferrous sulfate 325 (65 FE) MG tablet Take 1 tablet (325 mg total) by mouth 2 (two) times daily with a meal. Patient not taking: Reported on 06/09/2018 06/10/16 01/31/20  Thressa Sheller D, CNM  norgestimate-ethinyl estradiol (SPRINTEC 28) 0.25-35 MG-MCG tablet Take 1 tablet by mouth daily. Patient not taking: Reported on 06/09/2018 06/10/16 01/31/20  Armando Reichert, CNM    Family History Family History  Problem Relation Age of Onset   Hypertension Mother    Obesity Mother    Hypertension Father    CAD Other    Diabetes Mellitus II Other     Social History Social History   Tobacco Use   Smoking status: Every Day    Packs/day: 0.25    Types: Cigarettes   Smokeless tobacco: Never  Vaping Use   Vaping Use: Never used  Substance Use Topics   Alcohol use: Yes   Drug use: No     Allergies   Patient has no known allergies.   Review of Systems Review of Systems  Constitutional:  Positive for chills. Negative for appetite change and fever.  HENT:  Positive for congestion. Negative for ear pain, rhinorrhea, sinus pressure, sinus pain and sore throat.   Eyes:  Negative for redness and visual disturbance.  Respiratory:  Positive for cough. Negative for chest tightness, shortness of breath and wheezing.   Cardiovascular:  Negative for chest pain and palpitations.  Gastrointestinal:  Negative for abdominal pain, constipation, diarrhea, nausea and vomiting.  Genitourinary:  Negative for dysuria, frequency and urgency.  Musculoskeletal:  Negative for myalgias.  Neurological:  Negative for  dizziness, weakness and headaches.  Psychiatric/Behavioral:  Negative for confusion.   All other systems reviewed and are negative.   Physical Exam Triage Vital Signs ED Triage Vitals  Enc Vitals Group     BP 07/11/21 1509 (!) 167/120     Pulse Rate 07/11/21 1509 80     Resp 07/11/21 1509 20     Temp 07/11/21 1509 98 F (36.7 C)     Temp Source 07/11/21 1509 Oral     SpO2 07/11/21 1509 97 %     Weight --      Height --      Head Circumference --      Peak Flow --      Pain Score 07/11/21 1508 4     Pain Loc --      Pain Edu? --      Excl. in GC? --    No data found.  Updated Vital Signs BP (!) 167/120 (BP Location: Right Arm)   Pulse 80   Temp 98 F (36.7 C) (Oral)   Resp 20   LMP  (Within Weeks) Comment: 1 week  SpO2 97%   Visual Acuity Right Eye Distance:   Left Eye Distance:   Bilateral Distance:    Right Eye Near:   Left Eye Near:    Bilateral Near:     Physical Exam Vitals reviewed.  Constitutional:      General: She is not in acute distress.    Appearance: Normal appearance. She is ill-appearing.  HENT:     Head: Normocephalic and atraumatic.     Right Ear: Tympanic membrane, ear canal and external ear normal. No tenderness. No middle ear effusion. There is no impacted cerumen. Tympanic membrane is not perforated, erythematous, retracted or bulging.     Left Ear: Tympanic membrane, ear canal and external ear normal. No tenderness.  No middle ear effusion. There is no impacted cerumen. Tympanic membrane is not perforated, erythematous, retracted or bulging.     Nose: Nose normal. No congestion.     Mouth/Throat:     Mouth: Mucous membranes are moist.     Pharynx: Uvula midline. No oropharyngeal exudate or posterior oropharyngeal erythema.  Eyes:     Extraocular Movements: Extraocular movements intact.     Pupils: Pupils are equal, round, and reactive to light.  Cardiovascular:     Rate and Rhythm: Normal rate and regular rhythm.     Heart sounds:  Normal heart sounds.  Pulmonary:     Effort: Pulmonary effort is normal.     Breath sounds: Normal breath sounds. No decreased breath sounds, wheezing, rhonchi or rales.  Abdominal:     Palpations: Abdomen is  soft.     Tenderness: There is no abdominal tenderness. There is no guarding or rebound.  Neurological:     General: No focal deficit present.     Mental Status: She is alert and oriented to person, place, and time.  Psychiatric:        Mood and Affect: Mood normal.        Behavior: Behavior normal.        Thought Content: Thought content normal.        Judgment: Judgment normal.     UC Treatments / Results  Labs (all labs ordered are listed, but only abnormal results are displayed) Labs Reviewed  SARS CORONAVIRUS 2 (TAT 6-24 HRS)    EKG   Radiology No results found.  Procedures Procedures (including critical care time)  Medications Ordered in UC Medications - No data to display  Initial Impression / Assessment and Plan / UC Course  I have reviewed the triage vital signs and the nursing notes.  Pertinent labs & imaging results that were available during my care of the patient were reviewed by me and considered in my medical decision making (see chart for details).     This patient is a very pleasant 30 y.o. year old female presenting with suspected covid-19 following exposure to this at work. Today this pt is afebrile nontachycardic nontachypneic, oxygenating well on room air, no wheezes rhonchi or rales. Denies history cardiopulm ds. Requires work note.   Covid PCR sent. Molnupiravir sent by telemedicine 10/23, she did not pick this up.  Promethazine and imodium sent.  Blood pressure is 167/120, blood pressure medications for 5 days.  Pick up prescription from pharmacy and restart this. monitor this at home.  ED return precautions discussed. Patient verbalizes understanding and agreement.    Final Clinical Impressions(s) / UC Diagnoses   Final  diagnoses:  Viral URI with cough  Essential hypertension  Encounter for screening for COVID-19     Discharge Instructions      -Promethazine DM cough syrup for congestion/cough. This could make you drowsy, so take at night before bed. -Take the Imodium (loperamide) up to 4 times daily for diarrhea. -With a virus, you're typically contagious for 5-7 days, or as long as you're having fevers.     ED Prescriptions     Medication Sig Dispense Auth. Provider   promethazine-dextromethorphan (PROMETHAZINE-DM) 6.25-15 MG/5ML syrup Take 5 mLs by mouth 4 (four) times daily as needed for cough. 118 mL Rhys Martini, PA-C   loperamide (IMODIUM) 2 MG capsule Take 1 capsule (2 mg total) by mouth 4 (four) times daily as needed for diarrhea or loose stools. 12 capsule Rhys Martini, PA-C      PDMP not reviewed this encounter.   Rhys Martini, PA-C 07/11/21 1534

## 2021-07-11 NOTE — ED Triage Notes (Signed)
Pt reports chills, body aches, nasal; congestion, diarrhea and headache 1 week.

## 2021-07-11 NOTE — Discharge Instructions (Addendum)
-  Promethazine DM cough syrup for congestion/cough. This could make you drowsy, so take at night before bed. -Take the Imodium (loperamide) up to 4 times daily for diarrhea. -With a virus, you're typically contagious for 5-7 days, or as long as you're having fevers.

## 2021-07-12 LAB — SARS CORONAVIRUS 2 (TAT 6-24 HRS): SARS Coronavirus 2: NEGATIVE

## 2021-07-30 ENCOUNTER — Telehealth: Payer: Medicaid Other | Admitting: Nurse Practitioner

## 2021-07-30 DIAGNOSIS — K219 Gastro-esophageal reflux disease without esophagitis: Secondary | ICD-10-CM | POA: Diagnosis not present

## 2021-07-30 MED ORDER — OMEPRAZOLE 20 MG PO CPDR: 20.0000 mg | DELAYED_RELEASE_CAPSULE | Freq: Every day | ORAL | 3 refills | Status: DC

## 2021-07-30 MED ORDER — AMLODIPINE BESYLATE 5 MG PO TABS: 5.0000 mg | ORAL_TABLET | Freq: Every day | ORAL | 0 refills | Status: DC

## 2021-07-30 NOTE — Progress Notes (Signed)
E-Visit for Heartburn  We are sorry that you are not feeling well.  Here is how we plan to help!  Based on what you shared with me it looks like you most likely have Gastroesophageal Reflux Disease (GERD)  Gastroesophageal reflux disease (GERD) happens when acid from your stomach flows up into the esophagus.  When acid comes in contact with the esophagus, the acid causes sorenss (inflammation) in the esophagus.  Over time, GERD may create small holes (ulcers) in the lining of the esophagus.  I have prescribed Omeprazole 20 mg one by mouth daily until you follow up with a provider.  Your symptoms should improve in the next day or two.  You can use antacids as needed until symptoms resolve.  Call us if your heartburn worsens, you have trouble swallowing, weight loss, spitting up blood or recurrent vomiting.  Home Care: May include lifestyle changes such as weight loss, quitting smoking and alcohol consumption Avoid foods and drinks that make your symptoms worse, such as: Caffeine or alcoholic drinks Chocolate Peppermint or mint flavorings Garlic and onions Spicy foods Citrus fruits, such as oranges, lemons, or limes Tomato-based foods such as sauce, chili, salsa and pizza Fried and fatty foods Avoid lying down for 3 hours prior to your bedtime or prior to taking a nap Eat small, frequent meals instead of a large meals Wear loose-fitting clothing.  Do not wear anything tight around your waist that causes pressure on your stomach. Raise the head of your bed 6 to 8 inches with wood blocks to help you sleep.  Extra pillows will not help.  Seek Help Right Away If: You have pain in your arms, neck, jaw, teeth or back Your pain increases or changes in intensity or duration You develop nausea, vomiting or sweating (diaphoresis) You develop shortness of breath or you faint Your vomit is green, yellow, black or looks like coffee grounds or blood Your stool is red, bloody or black  These  symptoms could be signs of other problems, such as heart disease, gastric bleeding or esophageal bleeding.  Make sure you : Understand these instructions. Will watch your condition. Will get help right away if you are not doing well or get worse.  Your e-visit answers were reviewed by a board certified advanced clinical practitioner to complete your personal care plan.  Depending on the condition, your plan could have included both over the counter or prescription medications.  If there is a problem please reply  once you have received a response from your provider.  Your safety is important to us.  If you have drug allergies check your prescription carefully.    You can use MyChart to ask questions about today's visit, request a non-urgent call back, or ask for a work or school excuse for 24 hours related to this e-Visit. If it has been greater than 24 hours you will need to follow up with your provider, or enter a new e-Visit to address those concerns.  You will get an e-mail in the next two days asking about your experience.  I hope that your e-visit has been valuable and will speed your recovery. Thank you for using e-visits. 5-10 minutes spent reviewing and documenting in chart.   

## 2021-07-30 NOTE — Addendum Note (Signed)
Addended by: Bennie Pierini on: 07/30/2021 05:22 PM   Modules accepted: Orders

## 2021-08-10 ENCOUNTER — Encounter: Payer: Self-pay | Admitting: Nurse Practitioner

## 2021-08-10 ENCOUNTER — Encounter: Payer: Medicaid Other | Admitting: Nurse Practitioner

## 2021-08-10 ENCOUNTER — Telehealth: Payer: Medicaid Other | Admitting: Nurse Practitioner

## 2021-08-10 DIAGNOSIS — A084 Viral intestinal infection, unspecified: Secondary | ICD-10-CM

## 2021-08-10 MED ORDER — ONDANSETRON HCL 4 MG PO TABS: 4.0000 mg | ORAL_TABLET | Freq: Three times a day (TID) | ORAL | 0 refills | Status: AC | PRN

## 2021-08-10 NOTE — Progress Notes (Signed)
I have spent 5 minutes in review of e-visit questionnaire, review and updating patient chart, medical decision making and response to patient.  ° °Dorea Duff W Emillee Talsma, NP ° °  °

## 2021-08-10 NOTE — Progress Notes (Signed)
Patient already had e visit for same symptoms today. Requesting work note on video

## 2021-08-10 NOTE — Progress Notes (Signed)

## 2021-08-14 ENCOUNTER — Telehealth: Payer: Medicaid Other | Admitting: Family

## 2021-08-14 DIAGNOSIS — R197 Diarrhea, unspecified: Secondary | ICD-10-CM

## 2021-08-14 NOTE — Progress Notes (Signed)
Based on what you shared with me, I feel your condition warrants further evaluation and I recommend that you be seen in a face to face visit.  Given you are still having symptoms, you need to be seen to be evaluated. You may need stool cultures.    NOTE: There will be NO CHARGE for this eVisit   If you are having a true medical emergency please call 911.      For an urgent face to face visit, [place] has six urgent care centers for your convenience:     [facility] at [facility] Directions [phone] [address] [place], [place] [address]    [facility]) Get Driving Directions [phone] [address] [place], [place] [address]  [facility]) Get Driving Directions [phone] [address] [place],  [place]  [address]  [facility] Care at [facility] Get Driving Directions [phone] [address], [address] [place], [place] [address]   [facility] at [facility] Get Driving Directions  [phone] [address] [place], [place] [address]   [facility] Health Urgent Care at [facility] Directions [phone] [address], [address] [place], [place] [address]  Your MyChart E-visit questionnaire answers were reviewed by a board certified advanced clinical practitioner to complete your personal care plan based on your specific symptoms.  Thank you for using e-Visits.

## 2021-08-17 ENCOUNTER — Telehealth: Payer: Medicaid Other | Admitting: Nurse Practitioner

## 2021-08-17 DIAGNOSIS — R519 Headache, unspecified: Secondary | ICD-10-CM

## 2021-08-17 DIAGNOSIS — M549 Dorsalgia, unspecified: Secondary | ICD-10-CM

## 2021-08-18 NOTE — Progress Notes (Signed)
Patient presented for an evisit for headache and back pain. Reached out to the patient for additional information regarding her symptoms. Symptoms are improving at this time, recommended OTC medication. She will follow-up as needed.

## 2021-08-21 ENCOUNTER — Telehealth: Payer: Medicaid Other | Admitting: Emergency Medicine

## 2021-08-21 DIAGNOSIS — M545 Low back pain, unspecified: Secondary | ICD-10-CM | POA: Diagnosis not present

## 2021-08-21 MED ORDER — CYCLOBENZAPRINE HCL 10 MG PO TABS: 10.0000 mg | ORAL_TABLET | Freq: Three times a day (TID) | ORAL | 0 refills | Status: DC | PRN

## 2021-08-21 MED ORDER — NAPROXEN 500 MG PO TABS: 500.0000 mg | ORAL_TABLET | Freq: Two times a day (BID) | ORAL | 0 refills | Status: DC

## 2021-08-21 NOTE — Progress Notes (Signed)

## 2021-08-21 NOTE — Progress Notes (Signed)
I have spent 5 minutes in review of e-visit questionnaire, review and updating patient chart, medical decision making and response to patient.   Chianne Byrns, PA-C    

## 2021-08-26 ENCOUNTER — Telehealth: Payer: Medicaid Other

## 2021-09-04 ENCOUNTER — Telehealth: Payer: Medicaid Other | Admitting: Family

## 2021-09-04 DIAGNOSIS — R6889 Other general symptoms and signs: Secondary | ICD-10-CM

## 2021-09-04 MED ORDER — OSELTAMIVIR PHOSPHATE 75 MG PO CAPS: 75.0000 mg | ORAL_CAPSULE | Freq: Two times a day (BID) | ORAL | 0 refills | Status: DC

## 2021-09-04 NOTE — Progress Notes (Signed)

## 2021-09-05 ENCOUNTER — Telehealth: Payer: Medicaid Other | Admitting: Physician Assistant

## 2021-09-05 DIAGNOSIS — U071 COVID-19: Secondary | ICD-10-CM

## 2021-09-05 DIAGNOSIS — R079 Chest pain, unspecified: Secondary | ICD-10-CM

## 2021-09-05 NOTE — Progress Notes (Signed)
Based on what you shared with me, I feel your condition warrants further evaluation as soon as possible at an Emergency department.  Giving the known COVID-19 and worsening symptoms despite treatment, including shortness of breath and chest pain, you need to be evaluated in person ASAP.    NOTE: There will be NO CHARGE for this eVisit   If you are having a true medical emergency please call 911.      Emergency Department-[facility]  Get Driving Directions  [phone]  [address]  [place], [place] [address]  Open 24/7/365      [facility] at [facility]  Get Driving Directions  [address]  [place], [place] [address]  Open 24/7/365    Emergency Department- [facility]  Get Driving Directions  [phone]  [address]  [place], [place] [address]  Open 24/7/365      [facility] at [facility]  Get Driving Directions  [phone]  [address]  [place], [place] [address]  Open 24/7/365    [facility]  Emergency Department- [facility]  Get Driving Directions  [phone]  [address]  [place], [place] [address]  Open 24/7/365    [place]  Emergency Department- [facility]  Get Driving Directions  [address]  [place], [place] [address]  Open 24/7/365    [facility]  Emergency Department- [facility]  Get Driving Directions  [phone]  [address]  [place], [place] [address]  Open 24/7/365

## 2021-09-08 ENCOUNTER — Telehealth: Payer: Medicaid Other | Admitting: Emergency Medicine

## 2021-09-08 DIAGNOSIS — J069 Acute upper respiratory infection, unspecified: Secondary | ICD-10-CM | POA: Diagnosis not present

## 2021-09-08 MED ORDER — NAPROXEN 500 MG PO TABS: 500.0000 mg | ORAL_TABLET | Freq: Two times a day (BID) | ORAL | 0 refills | Status: DC

## 2021-09-08 MED ORDER — BENZONATATE 100 MG PO CAPS: 100.0000 mg | ORAL_CAPSULE | Freq: Two times a day (BID) | ORAL | 0 refills | Status: DC | PRN

## 2021-09-08 NOTE — Progress Notes (Signed)
Virtual Visit Consent   Stephanie Gutierrez, you are scheduled for a virtual visit with a [facility] provider today.     Just as with appointments in the office, your consent must be obtained to participate.  Your consent will be active for this visit and any virtual visit you may have with one of our providers in the next 365 days.     If you have a MyChart account, a copy of this consent can be sent to you electronically.  All virtual visits are billed to your insurance company just like a traditional visit in the office.    As this is a virtual visit, video technology does not allow for your provider to perform a traditional examination.  This may limit your provider's ability to fully assess your condition.  If your provider identifies any concerns that need to be evaluated in person or the need to arrange testing (such as labs, EKG, etc.), we will make arrangements to do so.     Although advances in technology are sophisticated, we cannot ensure that it will always work on either your end or our end.  If the connection with a video visit is poor, the visit may have to be switched to a telephone visit.  With either a video or telephone visit, we are not always able to ensure that we have a secure connection.     I need to obtain your verbal consent now.   Are you willing to proceed with your visit today? Yes   Reegan Mctighe has provided verbal consent on 09/08/2021 for a virtual visit (video or telephone).   Rennis Harding, [place]   Date: 09/08/2021 11:37 AM   Virtual Visit via Video Note   I, Rennis Harding, connected with  Stephanie Gutierrez  ([id number], 1991/03/01) on 09/08/21 at 11:30 AM EST by a video-enabled telemedicine application and verified that I am speaking with the correct person using two identifiers.  Location: Patient: Virtual Visit Location Patient: Home Provider: Virtual Visit Location Provider: Home Office   I discussed the limitations of evaluation and management by  telemedicine and the availability of in person appointments. The patient expressed understanding and agreed to proceed.    History of Present Illness: Stephanie Gutierrez is a 30 y.o. who identifies as a female who was assigned female at birth, and is being seen today for cough and congestion x 4-5 days.  Denies CP and SOB, does not know why she put that in as a complaint for her visit.  Denies sick exposure.  Suspicious she has covid, but has not taken a test.  Has tried OTC medications.  Overall improving.  Denies aggravating factors.  Reports previous covid infection in the past.  Denies fever, sore throat, CP, SOB, vomiting, and diarrhea.    Also would like work note for the past couple of days.  Informed patient we were unable to back-date notes  HPI: HPI  Problems:  Patient Active Problem List   Diagnosis Date Noted   Rhabdomyolysis 07/10/2014    Allergies: No Known Allergies Medications:  Current Outpatient Medications:    benzonatate (TESSALON) 100 MG capsule, Take 1 capsule (100 mg total) by mouth 2 (two) times daily as needed for cough., Disp: 20 capsule, Rfl: 0   naproxen (NAPROSYN) 500 MG tablet, Take 1 tablet (500 mg total) by mouth 2 (two) times daily with a meal., Disp: 30 tablet, Rfl: 0   acetaminophen (TYLENOL) 500 MG tablet, Take 500 mg by mouth every 6 (six)  hours as needed., Disp: , Rfl:    amLODipine (NORVASC) 5 MG tablet, Take 1 tablet (5 mg total) by mouth daily., Disp: 30 tablet, Rfl: 0   cyclobenzaprine (FLEXERIL) 10 MG tablet, Take 1 tablet (10 mg total) by mouth 3 (three) times daily as needed for muscle spasms., Disp: 30 tablet, Rfl: 0   loperamide (IMODIUM) 2 MG capsule, Take 1 capsule (2 mg total) by mouth 4 (four) times daily as needed for diarrhea or loose stools., Disp: 12 capsule, Rfl: 0   omeprazole (PRILOSEC) 20 MG capsule, Take 1 capsule (20 mg total) by mouth daily., Disp: 30 capsule, Rfl: 3   oseltamivir (TAMIFLU) 75 MG capsule, Take 1 capsule (75 mg total) by  mouth 2 (two) times daily., Disp: 10 capsule, Rfl: 0   promethazine-dextromethorphan (PROMETHAZINE-DM) 6.25-15 MG/5ML syrup, Take 5 mLs by mouth 4 (four) times daily as needed for cough., Disp: 118 mL, Rfl: 0  Observations/Objective: Patient is well-developed, well-nourished in no acute distress.  Resting comfortably at home. Fatigued appearing, but nontoxic Head is normocephalic, atraumatic.  No labored breathing. Speaking in full sentences and tolerating own secretions Speech is clear and coherent with logical content.  Patient is alert and oriented at baseline.   Assessment and Plan: 1. Viral URI with cough   Get plenty of rest and push fluids Tessalon perles for cough Naproxen for body aches Use zyrtec for nasal congestion, runny nose, and/or sore throat Use flonase for nasal congestion and runny nose Use medications daily for symptom relief Use OTC medications like ibuprofen or tylenol as needed fever or pain Follow up with PCP as needed Call or go to the ED if you have any new or worsening symptoms such as fever, worsening cough, shortness of breath, chest tightness, chest pain, turning blue, changes in mental status, etc...     Follow Up Instructions: I discussed the assessment and treatment plan with the patient. The patient was provided an opportunity to ask questions and all were answered. The patient agreed with the plan and demonstrated an understanding of the instructions.  A copy of instructions were sent to the patient via MyChart unless otherwise noted below.    The patient was advised to call back or seek an in-person evaluation if the symptoms worsen or if the condition fails to improve as anticipated.  Time:  I spent 5-10 minutes with the patient via telehealth technology discussing the above problems/concerns.    Rennis Harding, PA-C

## 2021-09-08 NOTE — Patient Instructions (Signed)
[name], thank you for joining Rennis Harding, PA-C for today's virtual visit.  While this provider is not your primary care provider (PCP), if your PCP is located in our provider database this encounter information will be shared with them immediately following your visit.  Consent: (Patient) Stephanie Gutierrez provided verbal consent for this virtual visit at the beginning of the encounter.  Current Medications:  Current Outpatient Medications:    benzonatate (TESSALON) 100 MG capsule, Take 1 capsule (100 mg total) by mouth 2 (two) times daily as needed for cough., Disp: 20 capsule, Rfl: 0   naproxen (NAPROSYN) 500 MG tablet, Take 1 tablet (500 mg total) by mouth 2 (two) times daily with a meal., Disp: 30 tablet, Rfl: 0   acetaminophen (TYLENOL) 500 MG tablet, Take 500 mg by mouth every 6 (six) hours as needed., Disp: , Rfl:    amLODipine (NORVASC) 5 MG tablet, Take 1 tablet (5 mg total) by mouth daily., Disp: 30 tablet, Rfl: 0   cyclobenzaprine (FLEXERIL) 10 MG tablet, Take 1 tablet (10 mg total) by mouth 3 (three) times daily as needed for muscle spasms., Disp: 30 tablet, Rfl: 0   loperamide (IMODIUM) 2 MG capsule, Take 1 capsule (2 mg total) by mouth 4 (four) times daily as needed for diarrhea or loose stools., Disp: 12 capsule, Rfl: 0   omeprazole (PRILOSEC) 20 MG capsule, Take 1 capsule (20 mg total) by mouth daily., Disp: 30 capsule, Rfl: 3   oseltamivir (TAMIFLU) 75 MG capsule, Take 1 capsule (75 mg total) by mouth 2 (two) times daily., Disp: 10 capsule, Rfl: 0   promethazine-dextromethorphan (PROMETHAZINE-DM) 6.25-15 MG/5ML syrup, Take 5 mLs by mouth 4 (four) times daily as needed for cough., Disp: 118 mL, Rfl: 0   Medications ordered in this encounter:  Meds ordered this encounter  Medications   benzonatate (TESSALON) 100 MG capsule    Sig: Take 1 capsule (100 mg total) by mouth 2 (two) times daily as needed for cough.    Dispense:  20 capsule    Refill:  0    Order Specific  Question:   Supervising Provider    Answer:   MILLER, BRIAN [[id number]]   naproxen (NAPROSYN) 500 MG tablet    Sig: Take 1 tablet (500 mg total) by mouth 2 (two) times daily with a meal.    Dispense:  30 tablet    Refill:  0    Order Specific Question:   Supervising Provider    Answer:   Eber Hong [[id number]]     *If you need refills on other medications prior to your next appointment, please contact your pharmacy*  Follow-Up: Call back or seek an in-person evaluation if the symptoms worsen or if the condition fails to improve as anticipated.  Other Instructions Get plenty of rest and push fluids Tessalon perles for cough Naproxen for body aches Use zyrtec for nasal congestion, runny nose, and/or sore throat Use flonase for nasal congestion and runny nose Use medications daily for symptom relief Use OTC medications like ibuprofen or tylenol as needed fever or pain Follow up with PCP as needed Call or go to the ED if you have any new or worsening symptoms such as fever, worsening cough, shortness of breath, chest tightness, chest pain, turning blue, changes in mental status, etc...     If you have been instructed to have an in-person evaluation today at a local Urgent Care facility, please use the link below. It will take you to a list of  all of our available [facility], including address, phone number and hours of operation. Please do not delay care.  [facility]  If you or a family member do not have a primary care provider, use the link below to schedule a visit and establish care. When you choose a [facility] primary care physician or advanced practice provider, you gain a long-term partner in health. Find a Primary Care Provider  Learn more about 's in-office and virtual care options: [facility]

## 2021-09-10 ENCOUNTER — Telehealth: Payer: Medicaid Other | Admitting: Nurse Practitioner

## 2021-09-10 DIAGNOSIS — K047 Periapical abscess without sinus: Secondary | ICD-10-CM

## 2021-09-10 MED ORDER — AMOXICILLIN 500 MG PO CAPS: 500.0000 mg | ORAL_CAPSULE | Freq: Three times a day (TID) | ORAL | 0 refills | Status: AC

## 2021-09-10 NOTE — Progress Notes (Signed)
E-Visit for Dental Pain  We are sorry that you are not feeling well.  Here is how we plan to help!  Based on what you have shared with me in the questionnaire, it sounds like you have an infected tooth.  Please start the following antibiotic:   Amoxicillin 500mg  3 times per day for 10 days  It is imperative that you see a dentist within 10 days of this eVisit to determine the cause of the dental pain and be sure it is adequately treated  A toothache or tooth pain is caused when the nerve in the root of a tooth or surrounding a tooth is irritated. Dental (tooth) infection, decay, injury, or loss of a tooth are the most common causes of dental pain. Pain may also occur after an extraction (tooth is pulled out). Pain sometimes originates from other areas and radiates to the jaw, thus appearing to be tooth pain.Bacteria growing inside your mouth can contribute to gum disease and dental decay, both of which can cause pain. A toothache occurs from inflammation of the central portion of the tooth called pulp. The pulp contains nerve endings that are very sensitive to pain. Inflammation to the pulp or pulpitis may be caused by dental cavities, trauma, and infection.    HOME CARE:   For toothaches: Over-the-counter pain medications such as acetaminophen or ibuprofen may be used. Take these as directed on the package while you arrange for a dental appointment. Avoid very cold or hot foods, because they may make the pain worse. You may get relief from biting on a cotton ball soaked in oil of cloves. You can get oil of cloves at most drug stores.  For jaw pain:  Aspirin may be helpful for problems in the joint of the jaw in adults. If pain happens every time you open your mouth widely, the temporomandibular joint (TMJ) may be the source of the pain. Yawning or taking a large bite of food may worsen the pain. An appointment with your doctor or dentist will help you find the cause.     GET HELP RIGHT  AWAY IF:  You have a high fever or chills If you have had a recent head or face injury and develop headache, light headedness, nausea, vomiting, or other symptoms that concern you after an injury to your face or mouth, you could have a more serious injury in addition to your dental injury. A facial rash associated with a toothache: This condition may improve with medication. Contact your doctor for them to decide what is appropriate. Any jaw pain occurring with chest pain: Although jaw pain is most commonly caused by dental disease, it is sometimes referred pain from other areas. People with heart disease, especially people who have had stents placed, people with diabetes, or those who have had heart surgery may have jaw pain as a symptom of heart attack or angina. If your jaw or tooth pain is associated with lightheadedness, sweating, or shortness of breath, you should see a doctor as soon as possible. Trouble swallowing or excessive pain or bleeding from gums: If you have a history of a weakened immune system, diabetes, or steroid use, you may be more susceptible to infections. Infections can often be more severe and extensive or caused by unusual organisms. Dental and gum infections in people with these conditions may require more aggressive treatment. An abscess may need draining or IV antibiotics, for example.  MAKE SURE YOU   Understand these instructions. Will watch your condition.  Will get help right away if you are not doing well or get worse.  Thank you for choosing an e-visit.  Your e-visit answers were reviewed by a board certified advanced clinical practitioner to complete your personal care plan. Depending upon the condition, your plan could have included both over the counter or prescription medications.  Please review your pharmacy choice. Make sure the pharmacy is open so you can pick up prescription now. If there is a problem, you may contact your provider through [employer] and have the prescription routed to another pharmacy.  Your safety is important to [place]. If you have drug allergies check your prescription carefully.   For the next 24 hours you can use MyChart to ask questions about today's visit, request a non-urgent call back, or ask for a work or school excuse. You will get an email in the next two days asking about your experience. I hope that your e-visit has been valuable and will speed your recovery.   I spent approximately 5 minutes reviewing the patient's history, current symptoms and coordinating their plan of care today.    Meds ordered this encounter  Medications   amoxicillin (AMOXIL) 500 MG capsule    Sig: Take 1 capsule (500 mg total) by mouth 3 (three) times daily for 10 days.    Dispense:  30 capsule    Refill:  0

## 2021-10-23 ENCOUNTER — Telehealth: Payer: Medicaid Other | Admitting: Physician Assistant

## 2021-10-23 DIAGNOSIS — I10 Essential (primary) hypertension: Secondary | ICD-10-CM | POA: Diagnosis not present

## 2021-10-23 DIAGNOSIS — F320 Major depressive disorder, single episode, mild: Secondary | ICD-10-CM

## 2021-10-23 MED ORDER — AMLODIPINE BESYLATE 5 MG PO TABS: 5.0000 mg | ORAL_TABLET | Freq: Every day | ORAL | 0 refills | Status: DC

## 2021-10-23 NOTE — Progress Notes (Signed)
Virtual Visit Consent   Linh Hedberg, you are scheduled for a virtual visit with a [facility] provider today.     Just as with appointments in the office, your consent must be obtained to participate.  Your consent will be active for this visit and any virtual visit you may have with one of our providers in the next 365 days.     If you have a MyChart account, a copy of this consent can be sent to you electronically.  All virtual visits are billed to your insurance company just like a traditional visit in the office.    As this is a virtual visit, video technology does not allow for your provider to perform a traditional examination.  This may limit your provider's ability to fully assess your condition.  If your provider identifies any concerns that need to be evaluated in person or the need to arrange testing (such as labs, EKG, etc.), we will make arrangements to do so.     Although advances in technology are sophisticated, we cannot ensure that it will always work on either your end or our end.  If the connection with a video visit is poor, the visit may have to be switched to a telephone visit.  With either a video or telephone visit, we are not always able to ensure that we have a secure connection.     I need to obtain your verbal consent now.   Are you willing to proceed with your visit today?    Stephanie Gutierrez has provided verbal consent on 10/23/2021 for a virtual visit (video or telephone).   Margaretann Loveless, PA-C   Date: 10/23/2021 5:04 PM   Virtual Visit via Video Note   I, Margaretann Loveless, connected with  Stephanie Gutierrez  ([id number], July 23, 1991) on 10/23/21 at  4:45 PM EST by a video-enabled telemedicine application and verified that I am speaking with the correct person using two identifiers.  Location: Patient: Virtual Visit Location Patient: Home Provider: Virtual Visit Location Provider: Home Office   I discussed the limitations of evaluation and management by  telemedicine and the availability of in person appointments. The patient expressed understanding and agreed to proceed.    History of Present Illness: Stephanie Gutierrez is a 31 y.o. who identifies as a female who was assigned female at birth, and is being seen today for depression.  HPI: Depression        This is a new problem.  The current episode started more than 1 month ago.   The onset quality is gradual.   The problem occurs daily.  The problem has been gradually worsening since onset.  Associated symptoms include fatigue, hopelessness, irritable, restlessness, decreased interest, headaches and sad.  Associated symptoms include no helplessness, does not have insomnia and no suicidal ideas.     The symptoms are aggravated by work stress.  Past treatments include psychotherapy.  Compliance with treatment is good.  Previous treatment provided no relief relief.  Has been in counseling through her employer. Has missed 2 weeks of work due to no desire to go to work or get out of bed. Denies SI or HI.  Also requesting refill of Amlodipine. Previously given at ER and has had one refill through [facility] in Nov 2022. Having headache possibly associated.   Problems:  Patient Active Problem List   Diagnosis Date Noted   Rhabdomyolysis 07/10/2014    Allergies: No Known Allergies Medications:  Current Outpatient Medications:  acetaminophen (TYLENOL) 500 MG tablet, Take 500 mg by mouth every 6 (six) hours as needed., Disp: , Rfl:    amLODipine (NORVASC) 5 MG tablet, Take 1 tablet (5 mg total) by mouth daily., Disp: 30 tablet, Rfl: 0   benzonatate (TESSALON) 100 MG capsule, Take 1 capsule (100 mg total) by mouth 2 (two) times daily as needed for cough., Disp: 20 capsule, Rfl: 0   cyclobenzaprine (FLEXERIL) 10 MG tablet, Take 1 tablet (10 mg total) by mouth 3 (three) times daily as needed for muscle spasms., Disp: 30 tablet, Rfl: 0   loperamide (IMODIUM) 2 MG capsule, Take 1 capsule (2 mg total) by mouth  4 (four) times daily as needed for diarrhea or loose stools., Disp: 12 capsule, Rfl: 0   naproxen (NAPROSYN) 500 MG tablet, Take 1 tablet (500 mg total) by mouth 2 (two) times daily with a meal., Disp: 30 tablet, Rfl: 0   omeprazole (PRILOSEC) 20 MG capsule, Take 1 capsule (20 mg total) by mouth daily., Disp: 30 capsule, Rfl: 3   oseltamivir (TAMIFLU) 75 MG capsule, Take 1 capsule (75 mg total) by mouth 2 (two) times daily., Disp: 10 capsule, Rfl: 0   promethazine-dextromethorphan (PROMETHAZINE-DM) 6.25-15 MG/5ML syrup, Take 5 mLs by mouth 4 (four) times daily as needed for cough., Disp: 118 mL, Rfl: 0  Observations/Objective: Patient is well-developed, well-nourished in no acute distress.  Resting comfortably at home.  Head is normocephalic, atraumatic.  No labored breathing.  Speech is clear and coherent with logical content.  Patient is alert and oriented at baseline.    Assessment and Plan: 1. Depression, major, single episode, mild (HCC)  2. Primary hypertension - amLODipine (NORVASC) 5 MG tablet; Take 1 tablet (5 mg total) by mouth daily.  Dispense: 30 tablet; Refill: 0  - Discussed continuing therapy - Mental health resources provided via AVS - Advised since she has no PCP currently and no accurate follow up on medications, I did not feel it safe to start medications at this time until these follow up resources are in place; she agrees - Amlodipine refilled x 1 - Link for establishing PCP also provided in AVS - Seek emergent care if symptoms worsen or develops any SI or HI  Follow Up Instructions: I discussed the assessment and treatment plan with the patient. The patient was provided an opportunity to ask questions and all were answered. The patient agreed with the plan and demonstrated an understanding of the instructions.  A copy of instructions were sent to the patient via MyChart unless otherwise noted below.    The patient was advised to call back or seek an in-person  evaluation if the symptoms worsen or if the condition fails to improve as anticipated.  Time:  I spent 15 minutes with the patient via telehealth technology discussing the above problems/concerns.    Margaretann Loveless, PA-C

## 2021-10-23 NOTE — Patient Instructions (Addendum)
[name], thank you for joining Margaretann Loveless, PA-C for today's virtual visit.  While this provider is not your primary care provider (PCP), if your PCP is located in our provider database this encounter information will be shared with them immediately following your visit.  Consent: (Patient) Stephanie Gutierrez provided verbal consent for this virtual visit at the beginning of the encounter.  Current Medications:  Current Outpatient Medications:    acetaminophen (TYLENOL) 500 MG tablet, Take 500 mg by mouth every 6 (six) hours as needed., Disp: , Rfl:    amLODipine (NORVASC) 5 MG tablet, Take 1 tablet (5 mg total) by mouth daily., Disp: 30 tablet, Rfl: 0   benzonatate (TESSALON) 100 MG capsule, Take 1 capsule (100 mg total) by mouth 2 (two) times daily as needed for cough., Disp: 20 capsule, Rfl: 0   cyclobenzaprine (FLEXERIL) 10 MG tablet, Take 1 tablet (10 mg total) by mouth 3 (three) times daily as needed for muscle spasms., Disp: 30 tablet, Rfl: 0   loperamide (IMODIUM) 2 MG capsule, Take 1 capsule (2 mg total) by mouth 4 (four) times daily as needed for diarrhea or loose stools., Disp: 12 capsule, Rfl: 0   naproxen (NAPROSYN) 500 MG tablet, Take 1 tablet (500 mg total) by mouth 2 (two) times daily with a meal., Disp: 30 tablet, Rfl: 0   omeprazole (PRILOSEC) 20 MG capsule, Take 1 capsule (20 mg total) by mouth daily., Disp: 30 capsule, Rfl: 3   oseltamivir (TAMIFLU) 75 MG capsule, Take 1 capsule (75 mg total) by mouth 2 (two) times daily., Disp: 10 capsule, Rfl: 0   promethazine-dextromethorphan (PROMETHAZINE-DM) 6.25-15 MG/5ML syrup, Take 5 mLs by mouth 4 (four) times daily as needed for cough., Disp: 118 mL, Rfl: 0   Medications ordered in this encounter:  Meds ordered this encounter  Medications   amLODipine (NORVASC) 5 MG tablet    Sig: Take 1 tablet (5 mg total) by mouth daily.    Dispense:  30 tablet    Refill:  0    Order Specific Question:   Supervising Provider     Answer:   Hyacinth Meeker, BRIAN [[id number]]     *If you need refills on other medications prior to your next appointment, please contact your pharmacy*  Follow-Up: Call back or seek an in-person evaluation if the symptoms worsen or if the condition fails to improve as anticipated.  Other Instructions Mindfulness Training/Deep Breathing/Guided Meditations: - Consider downloading one of the following apps as they each deal with walking you through these techniques -- Calm, HeadSpace, [place] - There are also a lot of great books on these subjects. Here are a few good ones to consider:           * Mindfulness for Beginners by Charlene Brooke           * Practicing Mindfulness by Trisha Mangle           * Breath WORK by Gene Smithson   National Suicide Prevention Lifeline (307) 403-6868 7043865151) OffParking.fi 988-Mental Health Crisis [facility] of Mental [facility] (239)867-5404 nimhinfo@nih .gov (e-mail) http://www.maynard.net/  Local Resources: Outpatient:   [facility]:  [phone] [address]   [place], [place]  [address]     https://thomas-smith.info/   [facility] [place] [address] [place], [place] [address] [phone]  [facility]  Outpatient Behavioral Health at [facility] [address] [employer] [place], [place]  [address] [phone] Local Resources (Inpatient)  [facility] [address]  [place], [place] [address] [phone]  [facility]   [address]  [place], [place] [address] [phone]   [facility] [place] http://chen.[email or web address]/  [facility] https://namiguilford.org/  [employer] and  Support Resources:  Partners Ending Homelessness DesireBlog.pl  Loss adjuster, chartered https://www.interactiveresourcecenter.org/  [employer] https://www.greensborourbanministry.org/  [facility] [employer] (adult mens shelter) www.opendoorministrieshp.org [address] [place], [place] [address] [phone]) [phone] [employer] of [employer] and [facility] (931) [phone] [address] [place], [place] [address] [phone]   [facility] ([phone] VET ([phone])  [facility] 7812563374 press 1 Confidential chat-VeteransCrisisLine.net Or Text to [phone]  [facility] Call [phone] or [phone] www.http://stephens-thompson.biz/  Affordable Housing Resources in [place] nchousingsearch.com   (458)361-5108  [facility] Housing Hotline 2481807618 8:30am-5:30pm Caring Services - Vet Safety Net [address] [place], [place]  [address] [phone] Female veterans 18+ with substance abuse issues Eligibility:  By Referral Only  [facility] [address] [place], [place] [address] [phone] Ext. 22 Adult Men & Women Eligibility: Valid ID & Social Engineer, drilling.greensborourbanministry.[facility]  Caring Services - Vet Safety Net [address] [place], [place]  [address] [phone] Female veterans 18+ with substance abuse issues Eligibility:  By Referral Only  [facility] [address] [place], [place]  [address] [phone] Single women 18+ without dependents Open 6pm-8am Eligibility:  Valid ID & Social Security Card Call to check availability  http://westendministries.org/leslieshouse.aspx  [facility] [address] [place], [place]  [address] [phone] Female veterans 18+ with substance abuse/mental health issues Eligibility:  By Referral Only  [facility] [address] [place], [place] [address] [phone] Call to check availability Males 18+ Eligibility: Valid ID & Social Security Card www.odm-hp.[facility] of [employer] [address] [place], [place] [address] [phone] Women 18+ & Families with children Eligibility:  Valid ID & Criminal Background Check https://gomez.info/     [facility] [place] (Care Management): [phone] [facility]: Behavioral Health 24 Hour Phone Line [phone] NAMI Hotline [phone] [facility] [place] [phone]  2-1-1 Referral Service [employer] [facility] 211 Call 211 or [phone] www.http://stephens-thompson.biz/ A free [employer], 24/7 telephone information and referral service to help link citizens who are seeking help with the community resources they need. In [place], [place], [place] and [place] counties, just dial 211 from your phone.  Mental Health Association in [place] [phone] Support groups for anxiety, depression and bipolar disorder, schizophrenia, family and friends, aftermath of suicide, and mental wellness for Latinos (in [place]).  Mental Health Association in [facility] 724-502-6679 Offers support groups, [employer] program offers. Psychological, vocational, educational and other rehabilitation services to those who suffer from mental health illness of the 76 and up (5 days a week/5hours a day), offer out patient services like diagnostic evaluation, comprehensive clinical assessments, individual counseling, group therapy, psycho-educational workshops, referral to other specialists, referral to a psychiatrist for an evaluation for medication, consultation, and outreach/training.  ADS (Alcohol and Drug Services) [phone] 4426451907 Substance Abuse education, prevention and treatment (detox, assessments, intensive outpatient and inpatient counseling and programs).  [facility] [place] (706)181-9537, [place] (519)083-9825 Support groups for posttraumatic stress, depression, and schizophrenia? as well as day programs for individuals with  severe mental illness.  [facility] [place] [phone]  [facility] House-FREE-[phone]  [employer] [phone] or www.kellinfoundation.org Telehealth platform, Individual counseling across the lifespan for both mental health and substance use, support groups, advocacy, case management, virtual villages, resource coordination.  [phone]  [facility]-FREE-531-473-2642 or 205 [phone]. Provides mental health services to all residents regardless of ability to pay. [address] [place]. [place]. 24  Domestic Violence Crisis Line Family Service of the [employer] for shelter and/or Chief of Staff [employer] (385)[phone] (24/7) [place] [phone] (24/7)  VA Homeless Hotline (469) 347-0733  [facility] ([phone])[phone] press 1 Confidential chat-VeteransCrisisLine.net Or Text to ([phone]  [facility] [address] [place], [place] [address] [phone] Hours: Mon-Fri. 8am-5pm [facility] Mobile Crisis Management Mobile crisis response for mental health, substance abuse or intellectual/developmental disabilities [phone]  Disclaimer: This resource list is subject to change at any time and is a starting point for resource identification as of 09/13/2021.     Take Care!    If you have been instructed to have an in-person evaluation today at a local Urgent Care facility, please use the link below. It will take you to a list of all of our available [facility], including address, phone number and hours of operation. Please do not delay care.  [facility]  If you or a family member do not have a primary care provider, use the link below to schedule a visit and establish care. When you choose a  [facility] primary care physician or advanced practice provider, you gain a long-term partner in health. Find a Primary Care Provider  Learn more about [facility]'s in-office and virtual care options:  - Get Care Now

## 2021-11-06 ENCOUNTER — Ambulatory Visit
Admission: RE | Admit: 2021-11-06 | Discharge: 2021-11-06 | Disposition: A | Discharge status: Home / Self Care | Payer: Medicaid Other | Source: Ambulatory Visit | Attending: Emergency Medicine | Admitting: Emergency Medicine

## 2021-11-06 VITALS — BP 145/97 | HR 100 | Temp 98.2°F | Resp 20

## 2021-11-06 DIAGNOSIS — J309 Allergic rhinitis, unspecified: Secondary | ICD-10-CM

## 2021-11-06 DIAGNOSIS — M62838 Other muscle spasm: Secondary | ICD-10-CM | POA: Diagnosis not present

## 2021-11-06 MED ORDER — FLUTICASONE PROPIONATE 50 MCG/ACT NA SUSP: 2.0000 | Freq: Every day | NASAL | 0 refills | Status: DC

## 2021-11-06 MED ORDER — KETOROLAC TROMETHAMINE 10 MG PO TABS: 10.0000 mg | ORAL_TABLET | Freq: Four times a day (QID) | ORAL | 0 refills | Status: AC | PRN

## 2021-11-06 MED ORDER — BACLOFEN 10 MG PO TABS: 10.0000 mg | ORAL_TABLET | Freq: Three times a day (TID) | ORAL | 0 refills | Status: AC

## 2021-11-06 MED ORDER — KETOROLAC TROMETHAMINE 60 MG/2ML IM SOLN
60.0000 mg | Freq: Once | INTRAMUSCULAR | Status: AC
  Administered 2021-11-06: 60 mg via INTRAMUSCULAR

## 2021-11-06 NOTE — Discharge Instructions (Addendum)
You received an injection of ketorolac in the office today.  This will provide you with meaningful pain relief for the next 6 to 8 hours.    Tonight, please begin baclofen about an hour before you plan to go to bed.  You can continue to take 1 tablet 3 times daily as needed for muscle spasticity.  Baclofen can make you sleepy so please do not drive while taking this medication.  Tomorrow morning, you can begin taking ketorolac up to 4 times daily as needed for pain.  Do not take ibuprofen, Advil, Motrin, Goody powders, naproxen, Aleve while taking this medication.  It is okay to take this medication with acetaminophen or Tylenol.  This medication can safely be taken together with baclofen.  Ketorolac and baclofen are safe to take with your other currently prescribed blood pressure and heartburn medications.  Thank you for visiting urgent care today.  We appreciate the opportunity to participate in your care.

## 2021-11-06 NOTE — ED Provider Notes (Addendum)
[facility]    CSN: [id number] Arrival date & time: 11/06/21  1424    HISTORY   Chief Complaint  Patient presents with   Shoulder Pain   HPI Stephanie Gutierrez is a 31 y.o. female. Pt c/o pain in left shoulder that began Saturday.  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  Patient states the pain came on gradually and has continued to get worse.  Today, patient states that she is unable to move her left upper arm without significant pain.  Patient denies loss of range of motion.  EMR reviewed, patient has had pain in the same shoulder in the past.  At that time, it was felt to be secondary to moving furniture.  The history is provided by the patient.  Past Medical History:  Diagnosis Date   Anxiety    Depression    Gallstones    Hypertension    not on meds    Obesity    Patient Active Problem List   Diagnosis Date Noted   Rhabdomyolysis 07/10/2014   Past Surgical History:  Procedure Laterality Date   CHOLECYSTECTOMY N/A 05/15/2020   Procedure: LAPAROSCOPIC CHOLECYSTECTOMY WITH  INTRAOPERATIVE CHOLANGIOGRAM;  Surgeon: Gaynelle AduWilson, Eric, MD;  Location: [facility];  Service: General;  Laterality: N/A;   NO PAST SURGERIES     OB History     Gravida  0   Para  0   Term  0   Preterm  0   AB  0   Living  0      SAB  0   IAB  0   Ectopic  0   Multiple  0   Live Births  0          Home Medications    Prior to Admission medications   Medication Sig Start Date End Date Taking? Authorizing Provider  acetaminophen (TYLENOL) 500 MG tablet Take 500 mg by mouth every 6 (six) hours as needed.    [provider]  amLODipine (NORVASC) 5 MG tablet Take 1 tablet (5 mg total) by mouth daily. 10/23/21   Margaretann LovelessBurnette, Jennifer M, PA-C  benzonatate (TESSALON) 100 MG capsule Take 1 capsule (100 mg total) by mouth 2 (two) times daily as needed for cough. 09/08/21   Wurst, GrenadaBrittany, PA-C   cyclobenzaprine (FLEXERIL) 10 MG tablet Take 1 tablet (10 mg total) by mouth 3 (three) times daily as needed for muscle spasms. 08/21/21   Wurst, GrenadaBrittany, PA-C  loperamide (IMODIUM) 2 MG capsule Take 1 capsule (2 mg total) by mouth 4 (four) times daily as needed for diarrhea or loose stools. 07/11/21   Rhys MartiniGraham, Laura E, PA-C  naproxen (NAPROSYN) 500 MG tablet Take 1 tablet (500 mg total) by mouth 2 (two) times daily with a meal. 09/08/21   Wurst, GrenadaBrittany, PA-C  omeprazole (PRILOSEC) 20 MG capsule Take 1 capsule (20 mg total) by mouth daily. 07/30/21   Daphine DeutscherMartin, Mary-Margaret, FNP  oseltamivir (TAMIFLU) 75 MG capsule Take 1 capsule (75 mg total) by mouth 2 (two) times daily. 09/04/21   Jannifer RodneyHawks, Christy A, FNP  promethazine-dextromethorphan (PROMETHAZINE-DM) 6.25-15 MG/5ML syrup Take 5 mLs by mouth 4 (four) times daily as needed for cough. 07/11/21   Rhys MartiniGraham, Laura E, PA-C  ferrous sulfate 325 (65 FE) MG tablet Take 1 tablet (325 mg total) by mouth 2 (two) times daily with a meal. Patient not taking: Reported on 06/09/2018 06/10/16 01/31/20  Armando Reichert, CNM  norgestimate-ethinyl estradiol (SPRINTEC 28) 0.25-35 MG-MCG tablet Take 1 tablet by mouth daily. Patient not taking: Reported on 06/09/2018 06/10/16 01/31/20  Armando Reichert, CNM    Family History Family History  Problem Relation Age of Onset   Hypertension Mother    Obesity Mother    Hypertension Father    CAD Other    Diabetes Mellitus II Other    Social History Social History   Tobacco Use   Smoking status: Every Day    Packs/day: 0.25    Types: Cigarettes   Smokeless tobacco: Never  Vaping Use   Vaping Use: Never used  Substance Use Topics   Alcohol use: Yes   Drug use: No   Allergies   Patient has no known allergies.  Review of Systems Review of Systems Pertinent findings noted in history of present illness.   Physical Exam Triage Vital Signs ED Triage Vitals  Enc Vitals Group     BP 07/12/21 0827 (!) 147/82      Pulse Rate 07/12/21 0827 72     Resp 07/12/21 0827 18     Temp 07/12/21 0827 98.3 F (36.8 C)     Temp Source 07/12/21 0827 Oral     SpO2 07/12/21 0827 98 %     Weight --      Height --      Head Circumference --      Peak Flow --      Pain Score 07/12/21 0826 5     Pain Loc --      Pain Edu? --      Excl. in GC? --   No data found.  Updated Vital Signs BP (!) 145/97 (BP Location: Right Arm)    Pulse 100    Temp 98.2 F (36.8 C) (Oral)    Resp 20    LMP 10/09/2021 (Approximate)    SpO2 95%   Physical Exam Vitals and nursing note reviewed.  Constitutional:      General: She is not in acute distress.    Appearance: Normal appearance. She is not ill-appearing.  HENT:     Head: Normocephalic and atraumatic.     Nose:     Right Turbinates: Enlarged, swollen and pale.     Left Turbinates: Enlarged, swollen and pale.      Comments: Horizontal crease across tip of nose horizontal Eyes:     General: Lids are normal.        Right eye: No discharge.        Left eye: No discharge.     Extraocular Movements: Extraocular movements intact.     Conjunctiva/sclera: Conjunctivae normal.     Right eye: Right conjunctiva is not injected.     Left eye: Left conjunctiva is not injected.  Neck:     Trachea: Trachea and phonation normal.  Cardiovascular:     Rate and Rhythm: Normal rate and regular rhythm.     Pulses: Normal pulses.     Heart sounds: Normal heart sounds. No murmur heard.   No friction rub. No gallop.  Pulmonary:     Effort: Pulmonary effort is normal. No accessory muscle usage, prolonged expiration or respiratory distress.     Breath sounds: Normal breath sounds. No stridor, decreased air movement or transmitted upper airway sounds. No decreased breath sounds, wheezing, rhonchi or rales.  Chest:     Chest wall: No tenderness.  Musculoskeletal:     Cervical back: Normal range of motion and  neck supple. Normal range of motion.     Comments: Patient has normal passive  range of motion of left shoulder joint but complains of significant pain when doing so.  Superior aspect of trapezius muscle is diffusely spasmed and tender to palpation.  Lymphadenopathy:     Cervical: No cervical adenopathy.  Skin:    General: Skin is warm and dry.     Findings: No erythema or rash.  Neurological:     General: No focal deficit present.     Mental Status: She is alert and oriented to person, place, and time.  Psychiatric:        Mood and Affect: Mood normal.        Behavior: Behavior normal.    Visual Acuity Right Eye Distance:   Left Eye Distance:   Bilateral Distance:    Right Eye Near:   Left Eye Near:    Bilateral Near:     UC Couse / Diagnostics / Procedures:    EKG  Radiology No results found.  Procedures Procedures (including critical care time)  UC Diagnoses / Final Clinical Impressions(s)   I have reviewed the triage vital signs and the nursing notes.  Pertinent labs & imaging results that were available during my care of the patient were reviewed by me and considered in my medical decision making (see chart for details).    Final diagnoses:  Muscle spasm of left shoulder area  Allergic rhinitis, unspecified seasonality, unspecified trigger   Patient provided with an injection of ketorolac and a prescription for ketorolac p.o.  Patient also provided with a prescription for baclofen.  Patient advised not to take ketorolac with other NSAIDs.  Patient advised not to drive while taking baclofen.  Return precautions advised. Patient provided with a prescription for Flonase for allergic rhinitis. ED Prescriptions     Medication Sig Dispense Auth. Provider   ketorolac (TORADOL) 10 MG tablet Take 1 tablet (10 mg total) by mouth every 6 (six) hours as needed for up to 5 days for moderate pain or severe pain. Do not take ibuprofen, Advil, Motrin, Goody powders, naproxen, Aleve while taking this medication 20 tablet Theadora Rama Scales, PA-C    baclofen (LIORESAL) 10 MG tablet Take 1 tablet (10 mg total) by mouth 3 (three) times daily for 7 days. 21 tablet Theadora Rama Scales, PA-C   fluticasone (FLONASE) 50 MCG/ACT nasal spray Place 2 sprays into both nostrils daily. 18 mL Theadora Rama Scales, PA-C      PDMP not reviewed this encounter.  Pending results:  Labs Reviewed - No data to display  Medications Ordered in UC: Medications  ketorolac (TORADOL) injection 60 mg (60 mg Intramuscular Given 11/06/21 1508)    Disposition Upon Discharge:  Condition: stable for discharge home Home: take medications as prescribed; routine discharge instructions as discussed; follow up as advised.  Patient presented with an acute illness with associated systemic symptoms and significant discomfort requiring urgent management. In my opinion, this is a condition that a prudent lay person (someone who possesses an average knowledge of health and medicine) may potentially expect to result in complications if not addressed urgently such as respiratory distress, impairment of bodily function or dysfunction of bodily organs.   Routine symptom specific, illness specific and/or disease specific instructions were discussed with the patient and/or caregiver at length.   As such, the patient has been evaluated and assessed, work-up was performed and treatment was provided in alignment with urgent care protocols and evidence based medicine.  Patient/parent/caregiver has been advised that the patient may require follow up for further testing and treatment if the symptoms continue in spite of treatment, as clinically indicated and appropriate.  If the patient was tested for COVID-19, Influenza and/or RSV, then the patient/parent/guardian was advised to isolate at home pending the results of his/her diagnostic coronavirus test and potentially longer if theyre positive. I have also advised pt that if his/her COVID-19 test returns positive, it's recommended to  self-isolate for at least 10 days after symptoms first appeared AND until fever-free for 24 hours without fever reducer AND other symptoms have improved or resolved. Discussed self-isolation recommendations as well as instructions for household member/close contacts as per the [facility] and [place] DHHS, and also gave patient the COVID packet with this information.  Patient/parent/caregiver has been advised to return to the [facility] or PCP in 3-5 days if no better; to PCP or the Emergency Department if new signs and symptoms develop, or if the current signs or symptoms continue to change or worsen for further workup, evaluation and treatment as clinically indicated and appropriate  The patient will follow up with their current PCP if and as advised. If the patient does not currently have a PCP we will assist them in obtaining one.   The patient may need specialty follow up if the symptoms continue, in spite of conservative treatment and management, for further workup, evaluation, consultation and treatment as clinically indicated and appropriate.   Patient/parent/caregiver verbalized understanding and agreement of plan as discussed.  All questions were addressed during visit.  Please see discharge instructions below for further details of plan.  Discharge Instructions:   Discharge Instructions      You received an injection of ketorolac in the office today.  This will provide you with meaningful pain relief for the next 6 to 8 hours.    Tonight, please begin baclofen about an hour before you plan to go to bed.  You can continue to take 1 tablet 3 times daily as needed for muscle spasticity.  Baclofen can make you sleepy so please do not drive while taking this medication.  Tomorrow morning, you can begin taking ketorolac up to 4 times daily as needed for pain.  Do not take ibuprofen, Advil, Motrin, Goody powders, naproxen, Aleve while taking this medication.  It is okay to take this medication with  acetaminophen or Tylenol.  This medication can safely be taken together with baclofen.  Ketorolac and baclofen are safe to take with your other currently prescribed blood pressure and heartburn medications.  Thank you for visiting urgent care today.  We appreciate the opportunity to participate in your care.      This office note has been dictated using Teaching laboratory technician.  Unfortunately, and despite my best efforts, this method of dictation can sometimes lead to occasional typographical or grammatical errors.  I apologize in advance if this occurs.     Theadora Rama Scales, PA-C 11/06/21 1510    Theadora Rama Scales, PA-C 11/06/21 1513

## 2021-11-06 NOTE — ED Triage Notes (Signed)
Pt c/o pain to left shoulder that began Saturday.

## 2021-12-12 ENCOUNTER — Telehealth: Payer: Medicaid Other | Admitting: Physician Assistant

## 2021-12-12 DIAGNOSIS — G43809 Other migraine, not intractable, without status migrainosus: Secondary | ICD-10-CM

## 2021-12-12 DIAGNOSIS — R112 Nausea with vomiting, unspecified: Secondary | ICD-10-CM | POA: Diagnosis not present

## 2021-12-12 MED ORDER — ONDANSETRON HCL 4 MG PO TABS: 4.0000 mg | ORAL_TABLET | Freq: Three times a day (TID) | ORAL | 0 refills | Status: DC | PRN

## 2021-12-12 NOTE — Progress Notes (Signed)

## 2021-12-26 IMAGING — US US ABDOMEN LIMITED
1 series · 14 of 25 positions shown · non-contrast
Comparison: None.

CLINICAL DATA: Abdominal pain

EXAM:
ULTRASOUND ABDOMEN LIMITED RIGHT UPPER QUADRANT

[Series 1: us abdomen limited · 14 of 35 slices shown]
[im 1/35]
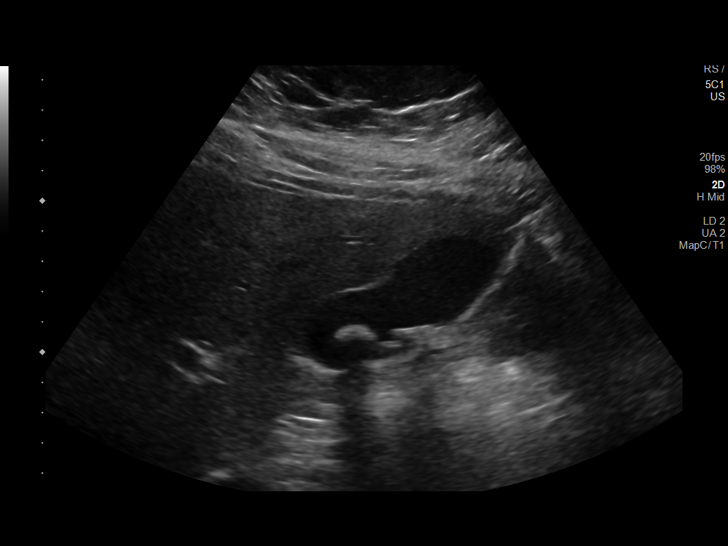
[im 3/35]
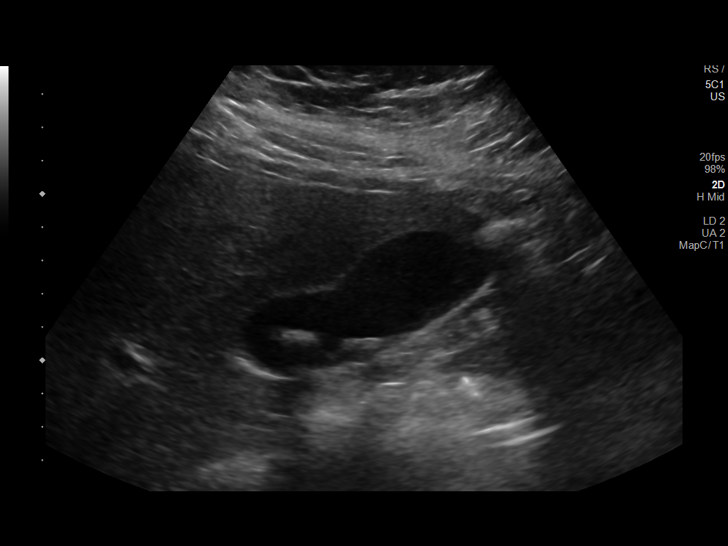
[im 6/35]
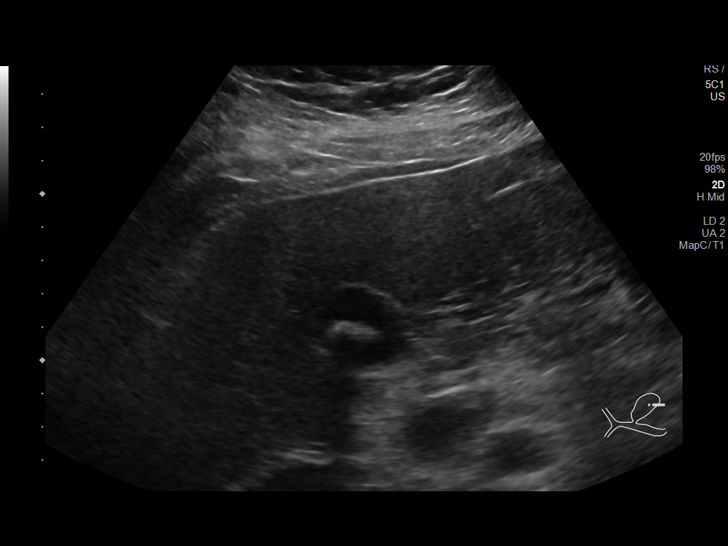
[im 9/35]
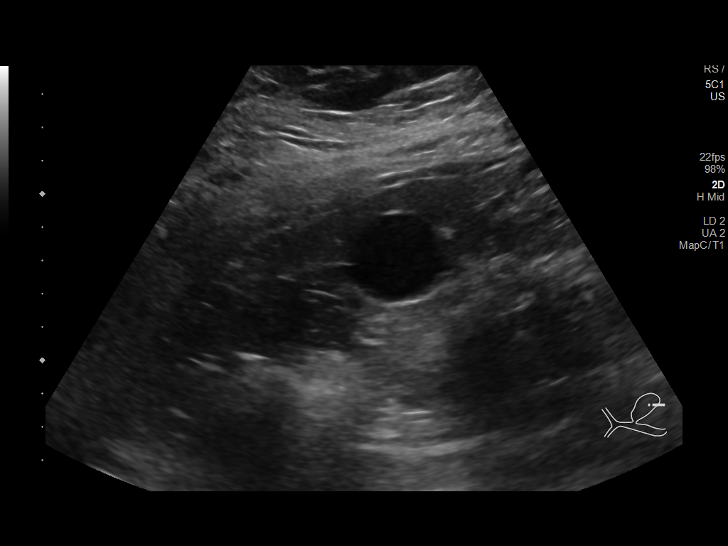
[im 12/35]
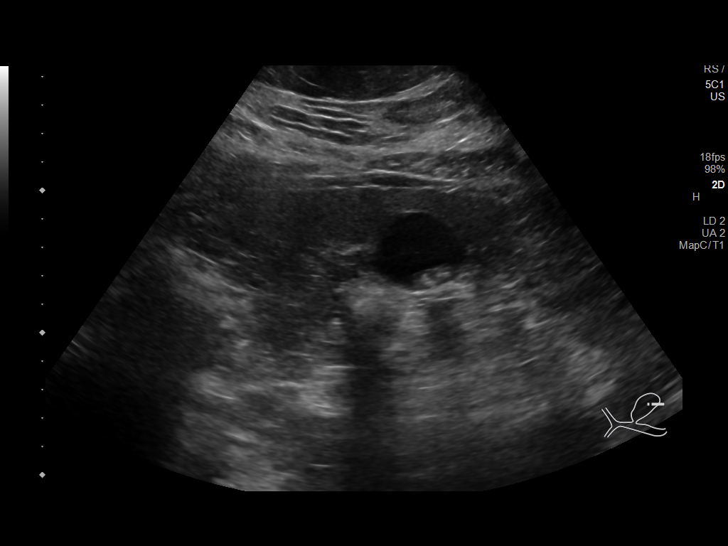
[im 13/35]
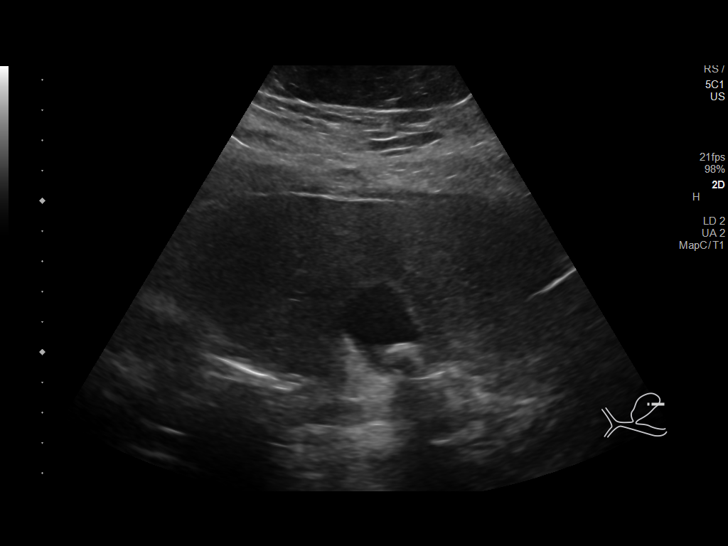
[im 16/35]
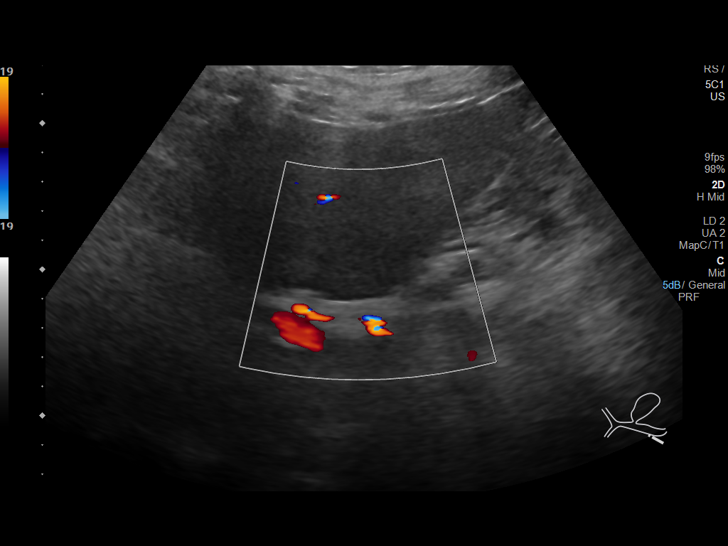
[im 19/35]
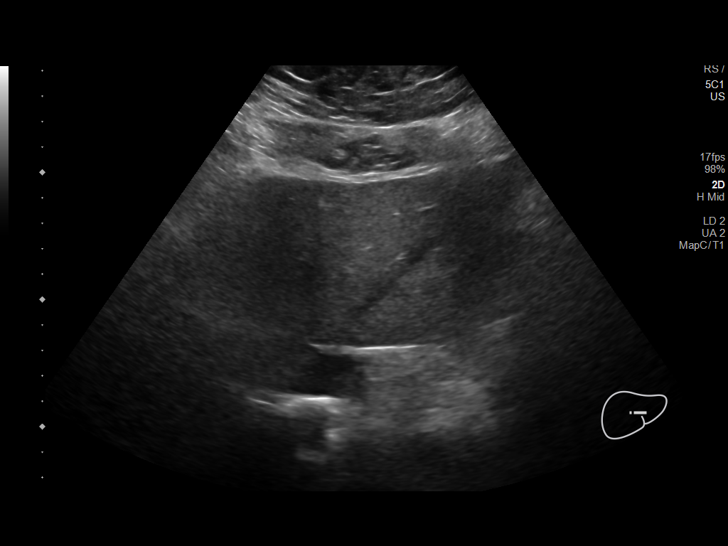
[im 22/35]
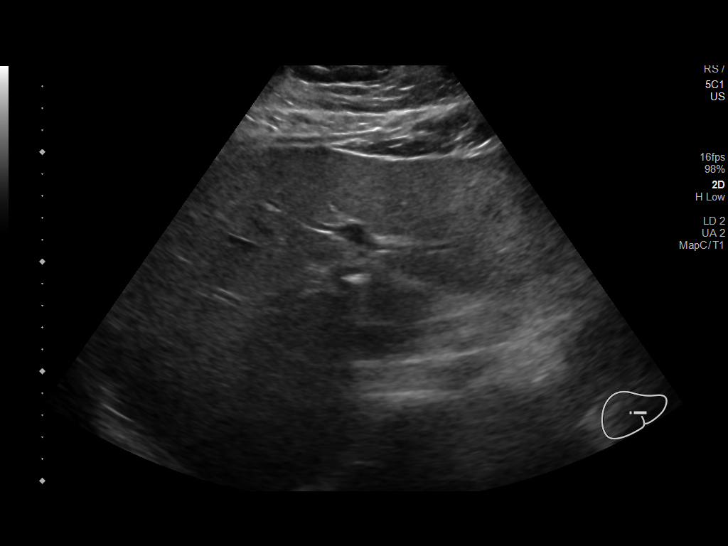
[im 23/35]
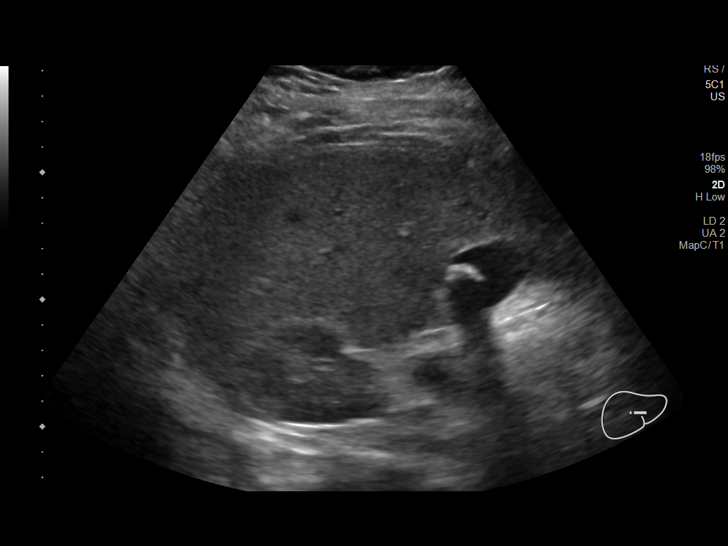
[im 26/35]
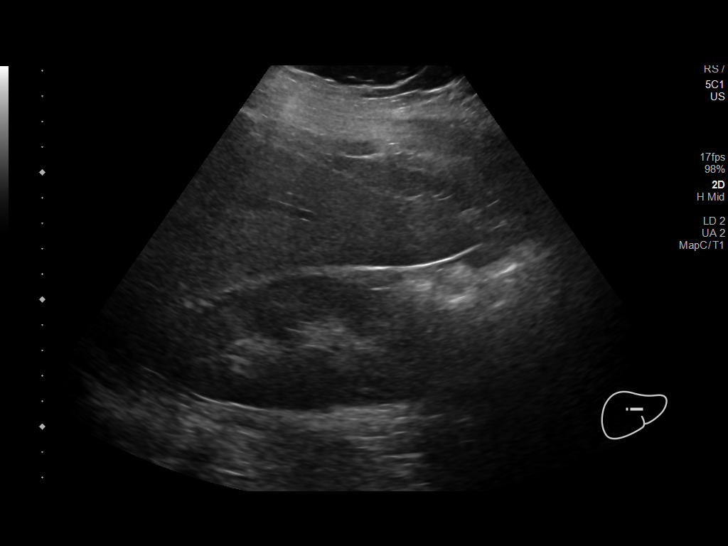
[im 29/35]
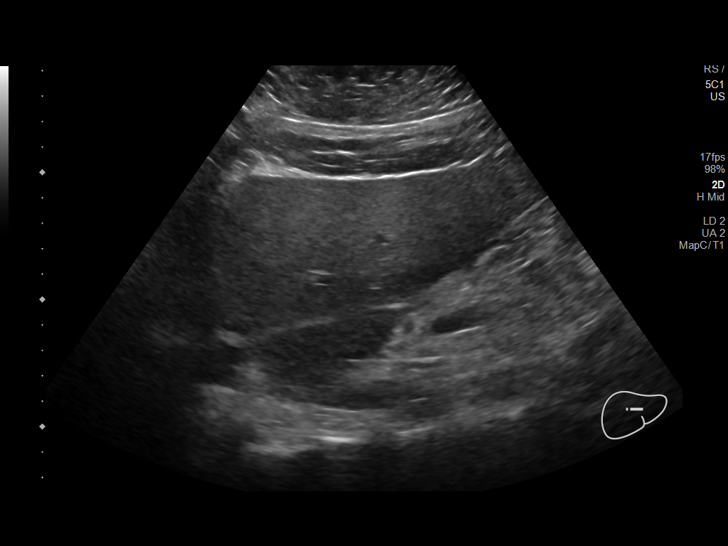
[im 32/35]
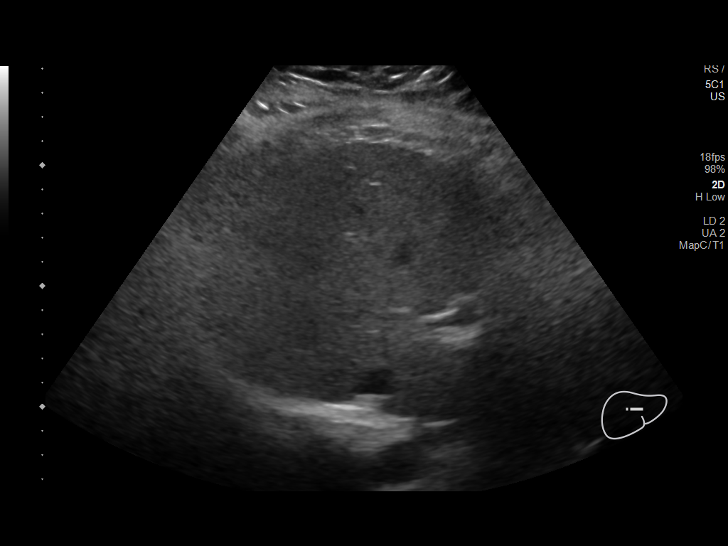
[im 35/35]
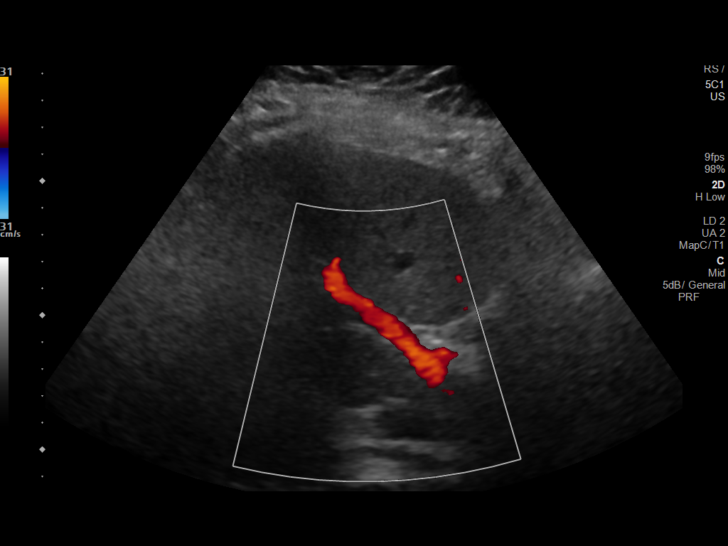

[14 of 25 positions shown; findings below may reference images not displayed]

FINDINGS: Gallbladder:

Cholelithiasis is identified without wall thickening or
pericholecystic fluid. Negative sonographic Murphy's sign is noted.

Common bile duct:

Diameter: 3.2 mm.

Liver:

Mild increased echogenicity within the liver consistent with fatty
infiltration. Portal vein is patent on color Doppler imaging with
normal direction of blood flow towards the liver.

Other: None.
IMPRESSION: Cholelithiasis without complicating factors.

Fatty liver.

## 2022-01-03 ENCOUNTER — Telehealth: Payer: Medicaid Other | Admitting: Physician Assistant

## 2022-01-03 DIAGNOSIS — M545 Low back pain, unspecified: Secondary | ICD-10-CM

## 2022-01-03 MED ORDER — NAPROXEN 500 MG PO TABS: 500.0000 mg | ORAL_TABLET | Freq: Two times a day (BID) | ORAL | 0 refills | Status: DC

## 2022-01-03 MED ORDER — BACLOFEN 10 MG PO TABS: 10.0000 mg | ORAL_TABLET | Freq: Three times a day (TID) | ORAL | 0 refills | Status: DC

## 2022-01-03 NOTE — Progress Notes (Signed)

## 2022-01-09 ENCOUNTER — Telehealth: Payer: Medicaid Other | Admitting: Physician Assistant

## 2022-01-09 DIAGNOSIS — M546 Pain in thoracic spine: Secondary | ICD-10-CM

## 2022-01-09 NOTE — Progress Notes (Signed)
Based on what you shared with me, I feel your condition warrants further evaluation and I recommend that you be seen in a face to face visit. Giving severity of pain and that you were just evaluated via e-visit for back pain, you will need to be evaluated in person.  ?  ?NOTE: There will be NO CHARGE for this eVisit ?  ?If you are having a true medical emergency please call 911.   ?  ? For an urgent face to face visit, [facility] has six urgent care centers for your convenience:  ?  ? [facility] at [facility] ?Get Driving Directions ?[phone] ?[phone] ?[place], [place] [address] ?  ? [facility]) ?Get Driving Directions ?[phone] ?[address] ?[place], [place] [address] ? ?[facility]) ?Get Driving Directions ?[phone] ?[address] ?[place],  [place]  [address] ? ?[place] [facility] ?Get Driving Directions ?[phone] ?[address] ?[place], [place] [address] ?  ?[facility] [place] ?Get Driving Directions  ?[phone] ?[address] ?[address] ?[place], [place] [address] ?  ?[place] [place] [facility] ?Get Driving Directions ?[phone] ?[address] ?[place], [place] [address] ? ?Your MyChart E-visit questionnaire answers were reviewed by a board certified advanced clinical practitioner to complete your personal care plan based on your specific symptoms.  Thank you for using e-Visits. ?  ? ?

## 2022-01-22 ENCOUNTER — Telehealth: Payer: Medicaid Other | Admitting: Physician Assistant

## 2022-01-22 DIAGNOSIS — M549 Dorsalgia, unspecified: Secondary | ICD-10-CM

## 2022-01-22 NOTE — Progress Notes (Signed)
Based on what you shared with me, I feel your condition warrants further evaluation and I recommend that you be seen in a face to face visit. ? ?Giving 10/10 level of pain, this requires an in-person evaluation as you need a detailed examination to make sure nothing is missed and the proper treatments are given.  ?  ?NOTE: There will be NO CHARGE for this eVisit ?  ?If you are having a true medical emergency please call 911.   ?  ? For an urgent face to face visit, [facility] has six urgent care centers for your convenience:  ?  ? [facility] ?Get Driving Directions ?[phone] ?[phone] ?[place], [place] [address] ?  ? [facility]) ?Get Driving Directions ?[phone] ?[address] ?[place], [place] [address] ? ?[facility]) ?Get Driving Directions ?[phone] ?[address] ?[place],  [place]  [address] ? ?[place] [facility] ?Get Driving Directions ?[phone] ?[address] ?[place], [place] [address] ?  ?[place] [facility] [place] ?Get Driving Directions  [phone] ?[address] ?[address] ?[place], [place] [address] ?  ?[place] [facility] ?Get Driving Directions ?[phone] ?[address] ?[place], [place] [address] ? ?Your MyChart E-visit questionnaire answers were reviewed by a board certified advanced clinical practitioner to complete your personal care plan based on your specific symptoms.  Thank you for using e-Visits. ?  ? ?

## 2022-01-23 ENCOUNTER — Ambulatory Visit: Payer: Self-pay

## 2022-01-25 ENCOUNTER — Telehealth: Payer: Medicaid Other | Admitting: Nurse Practitioner

## 2022-01-25 DIAGNOSIS — R112 Nausea with vomiting, unspecified: Secondary | ICD-10-CM

## 2022-01-25 MED ORDER — ONDANSETRON HCL 4 MG PO TABS: 4.0000 mg | ORAL_TABLET | Freq: Three times a day (TID) | ORAL | 0 refills | Status: DC | PRN

## 2022-01-25 NOTE — Progress Notes (Signed)

## 2022-01-25 NOTE — Progress Notes (Signed)
I have spent 5 minutes in review of e-visit questionnaire, review and updating patient chart, medical decision making and response to patient.  ° °Dania Marsan W Teondre Jarosz, NP ° °  °

## 2022-06-09 ENCOUNTER — Telehealth: Payer: Medicaid Other | Admitting: Physician Assistant

## 2022-06-09 DIAGNOSIS — K047 Periapical abscess without sinus: Secondary | ICD-10-CM

## 2022-06-09 MED ORDER — AMOXICILLIN 500 MG PO CAPS: 500.0000 mg | ORAL_CAPSULE | Freq: Three times a day (TID) | ORAL | 0 refills | Status: AC

## 2022-06-09 MED ORDER — IBUPROFEN 600 MG PO TABS: 600.0000 mg | ORAL_TABLET | Freq: Three times a day (TID) | ORAL | 0 refills | Status: AC | PRN

## 2022-06-09 NOTE — Progress Notes (Signed)

## 2022-11-23 ENCOUNTER — Other Ambulatory Visit: Payer: Self-pay | Admitting: Nurse Practitioner

## 2022-11-23 DIAGNOSIS — I10 Essential (primary) hypertension: Secondary | ICD-10-CM

## 2022-11-24 ENCOUNTER — Telehealth: Payer: Medicaid Other | Admitting: Nurse Practitioner

## 2022-11-24 ENCOUNTER — Telehealth: Payer: Medicaid Other

## 2022-11-24 ENCOUNTER — Telehealth: Payer: Medicaid Other | Admitting: Physician Assistant

## 2022-11-24 ENCOUNTER — Other Ambulatory Visit (INDEPENDENT_AMBULATORY_CARE_PROVIDER_SITE_OTHER): Payer: Self-pay | Admitting: Physician Assistant

## 2022-11-24 DIAGNOSIS — I10 Essential (primary) hypertension: Secondary | ICD-10-CM

## 2022-11-24 DIAGNOSIS — M549 Dorsalgia, unspecified: Secondary | ICD-10-CM

## 2022-11-24 MED ORDER — AMLODIPINE BESYLATE 5 MG PO TABS: 5.0000 mg | ORAL_TABLET | Freq: Every day | ORAL | 1 refills | Status: DC

## 2022-11-24 NOTE — Progress Notes (Signed)
Because you are having severe back pain rated at 10/10, I feel your condition warrants further evaluation and I recommend that you be seen in a face to face visit. At this visit your while your back pain is being assessed they can also check your blood pressure and refill your blood pressure medication as well.    NOTE: There will be NO CHARGE for this eVisit   If you are having a true medical emergency please call 911.      For an urgent face to face visit, [facility] has eight urgent care centers for your convenience:   NEW!! [place] [facility] [place] Get Driving Directions [phone] [address] [place], [place] [facility] [place] [facility] [place] Get Driving Directions [id number] [place], [place] [address]   [place] [facility] [place] [facility]) Get Driving Directions [phone] [address], [place] [address]  [place] [facility] [place]) Get Driving Directions [id number] [address] [place],  [place]  [address]  [place] [facility] [place] [facility] Get Driving Directions  [phone] [id number] [place],  [place] [address]   [place] [facility] Get Driving Directions [id number] [place], [place], [place] [address]   [place] [facility] [place] Get Driving Directions  [id number] [address] [place],  [address]   [place] [facility] at [place] Get Driving Directions [id number] [address], [place],  [address]  Your MyChart E-visit questionnaire answers were reviewed by a board certified advanced clinical practitioner to complete your personal care plan based on your specific symptoms.  Thank you for using e-Visits.   I have spent 5 minutes in review of  e-visit questionnaire, review and updating patient chart, medical decision making and response to patient.   Mar Daring, PA-C

## 2022-11-24 NOTE — Progress Notes (Signed)
Virtual Visit Consent   Stephanie Gutierrez, you are scheduled for a virtual visit with a [facility] provider today. Just as with appointments in the office, your consent must be obtained to participate. Your consent will be active for this visit and any virtual visit you may have with one of our providers in the next 365 days. If you have a MyChart account, a copy of this consent can be sent to you electronically.  As this is a virtual visit, video technology does not allow for your provider to perform a traditional examination. This may limit your provider's ability to fully assess your condition. If your provider identifies any concerns that need to be evaluated in person or the need to arrange testing (such as labs, EKG, etc.), we will make arrangements to do so. Although advances in technology are sophisticated, we cannot ensure that it will always work on either your end or our end. If the connection with a video visit is poor, the visit may have to be switched to a telephone visit. With either a video or telephone visit, we are not always able to ensure that we have a secure connection.  By engaging in this virtual visit, you consent to the provision of healthcare and authorize for your insurance to be billed (if applicable) for the services provided during this visit. Depending on your insurance coverage, you may receive a charge related to this service.  I need to obtain your verbal consent now. Are you willing to proceed with your visit today? Stephanie Gutierrez has provided verbal consent on 11/24/2022 for a virtual visit (video or telephone). Apolonio Schneiders, FNP  Date: 11/24/2022 6:57 PM  Virtual Visit via Video Note   I, Apolonio Schneiders, connected with  Stephanie Gutierrez  ([id number], April 26, 1991) on 11/24/22 at  7:00 PM EDT by a video-enabled telemedicine application and verified that I am speaking with the correct person using two identifiers.  Location: Patient: Virtual Visit Location Patient: Home Provider:  Virtual Visit Location Provider: Home Office   I discussed the limitations of evaluation and management by telemedicine and the availability of in person appointments. The patient expressed understanding and agreed to proceed.    History of Present Illness: Stephanie Gutierrez is a 32 y.o. who identifies as a female who was assigned female at birth, and is being seen today for blood pressure follow up.  She took her last blood pressure pill today   She has a PCP appointment next month to establish care   She has been taking Amlodipine Norvasc daily at '5mg'$    Denies current pregnancy   She has been measuring her BP at her Moms house and at work   Problems:  Patient Active Problem List   Diagnosis Date Noted   Rhabdomyolysis 07/10/2014    Allergies: No Known Allergies Medications:  Current Outpatient Medications:    amLODipine (NORVASC) 5 MG tablet, Take 1 tablet (5 mg total) by mouth daily., Disp: 30 tablet, Rfl: 0   baclofen (LIORESAL) 10 MG tablet, Take 1 tablet (10 mg total) by mouth 3 (three) times daily., Disp: 30 each, Rfl: 0   fluticasone (FLONASE) 50 MCG/ACT nasal spray, Place 2 sprays into both nostrils daily., Disp: 18 mL, Rfl: 0   ibuprofen (ADVIL) 600 MG tablet, Take 1 tablet (600 mg total) by mouth every 8 (eight) hours as needed., Disp: 30 tablet, Rfl: 0   naproxen (NAPROSYN) 500 MG tablet, Take 1 tablet (500 mg total) by mouth 2 (two) times daily with a  meal., Disp: 30 tablet, Rfl: 0   omeprazole (PRILOSEC) 20 MG capsule, Take 1 capsule (20 mg total) by mouth daily., Disp: 30 capsule, Rfl: 3   ondansetron (ZOFRAN) 4 MG tablet, Take 1 tablet (4 mg total) by mouth every 8 (eight) hours as needed for nausea or vomiting., Disp: 20 tablet, Rfl: 0  Observations/Objective: Patient is well-developed, well-nourished in no acute distress.  Resting comfortably  at home.  Head is normocephalic, atraumatic.  No labored breathing.  Speech is clear and coherent with logical content.   Patient is alert and oriented at baseline.    Assessment and Plan: 1. Primary hypertension Discussed this is last refill we can provide, encouraged follow up with PCP next month,  Visit ED with any acute worsening symptoms prior to appointment   - amLODipine (NORVASC) 5 MG tablet; Take 1 tablet (5 mg total) by mouth daily.  Dispense: 30 tablet; Refill: 1     Follow Up Instructions: I discussed the assessment and treatment plan with the patient. The patient was provided an opportunity to ask questions and all were answered. The patient agreed with the plan and demonstrated an understanding of the instructions.  A copy of instructions were sent to the patient via MyChart unless otherwise noted below.    The patient was advised to call back or seek an in-person evaluation if the symptoms worsen or if the condition fails to improve as anticipated.  Time:  I spent 15 minutes with the patient via telehealth technology discussing the above problems/concerns.    Apolonio Schneiders, FNP

## 2022-11-27 ENCOUNTER — Ambulatory Visit: Payer: Self-pay

## 2022-12-18 ENCOUNTER — Ambulatory Visit (INDEPENDENT_AMBULATORY_CARE_PROVIDER_SITE_OTHER): Payer: Medicaid Other

## 2022-12-18 ENCOUNTER — Ambulatory Visit
Admission: EM | Admit: 2022-12-18 | Discharge: 2022-12-18 | Disposition: A | Discharge status: Home / Self Care | Payer: Medicaid Other | Attending: Urgent Care | Admitting: Urgent Care

## 2022-12-18 DIAGNOSIS — M25512 Pain in left shoulder: Secondary | ICD-10-CM

## 2022-12-18 DIAGNOSIS — S46912A Strain of unspecified muscle, fascia and tendon at shoulder and upper arm level, left arm, initial encounter: Secondary | ICD-10-CM

## 2022-12-18 MED ORDER — MELOXICAM 15 MG PO TABS: 15.0000 mg | ORAL_TABLET | Freq: Every day | ORAL | 1 refills | Status: AC

## 2022-12-18 MED ORDER — CYCLOBENZAPRINE HCL 5 MG PO TABS: 5.0000 mg | ORAL_TABLET | Freq: Every evening | ORAL | 0 refills | Status: DC | PRN

## 2022-12-18 MED ORDER — IBUPROFEN 800 MG PO TABS
800.0000 mg | ORAL_TABLET | Freq: Once | ORAL | Status: AC
  Administered 2022-12-18: 800 mg via ORAL

## 2022-12-18 NOTE — ED Triage Notes (Addendum)
Pt reports right arm since last night when she tried to picked up a tired at work. Ibuprofen gives no relief.  Pt reports she will not file Workers comp today.

## 2022-12-18 NOTE — ED Provider Notes (Signed)
[facility]ARE [facility]  Note:  This document was prepared using Systems analyst and may include unintentional dictation errors.  MRN: [id number] DOB: 08/01/1991  Subjective:   Stephanie Gutierrez is a 32 y.o. female presenting for 1 day history of suffering a left shoulder injury.  Patient was at work, tried to pick up a heavy tire and ended up feeling like she pulled her arm.  Has since had severe 10 out of 10 pain.  She tried an ibuprofen that helped very little.  Has not been able to use her arm much today due to her pain.  Is requesting a note for work.  No current facility-administered medications for this encounter.  Current Outpatient Medications:    amLODipine (NORVASC) 5 MG tablet, Take 1 tablet (5 mg total) by mouth daily., Disp: 30 tablet, Rfl: 1   baclofen (LIORESAL) 10 MG tablet, Take 1 tablet (10 mg total) by mouth 3 (three) times daily., Disp: 30 each, Rfl: 0   fluticasone (FLONASE) 50 MCG/ACT nasal spray, Place 2 sprays into both nostrils daily., Disp: 18 mL, Rfl: 0   ibuprofen (ADVIL) 600 MG tablet, Take 1 tablet (600 mg total) by mouth every 8 (eight) hours as needed., Disp: 30 tablet, Rfl: 0   naproxen (NAPROSYN) 500 MG tablet, Take 1 tablet (500 mg total) by mouth 2 (two) times daily with a meal., Disp: 30 tablet, Rfl: 0   omeprazole (PRILOSEC) 20 MG capsule, Take 1 capsule (20 mg total) by mouth daily., Disp: 30 capsule, Rfl: 3   ondansetron (ZOFRAN) 4 MG tablet, Take 1 tablet (4 mg total) by mouth every 8 (eight) hours as needed for nausea or vomiting., Disp: 20 tablet, Rfl: 0   No Known Allergies  Past Medical History:  Diagnosis Date   Anxiety    Depression    Gallstones    Hypertension    not on meds    Obesity      Past Surgical History:  Procedure Laterality Date   CHOLECYSTECTOMY N/A 05/15/2020   Procedure: LAPAROSCOPIC CHOLECYSTECTOMY WITH  INTRAOPERATIVE CHOLANGIOGRAM;  Surgeon: Greer Pickerel, MD;  Location: [facility];  Service:  General;  Laterality: N/A;   NO PAST SURGERIES      Family History  Problem Relation Age of Onset   Hypertension Mother    Obesity Mother    Hypertension Father    CAD Other    Diabetes Mellitus II Other     Social History   Tobacco Use   Smoking status: Every Day    Packs/day: .25    Types: Cigarettes   Smokeless tobacco: Never  Vaping Use   Vaping Use: Never used  Substance Use Topics   Alcohol use: Yes    Comment: Special ocassions   Drug use: No    ROS   Objective:   Vitals: BP (!) 138/94 (BP Location: Right Arm)   Pulse 85   Temp 98 F (36.7 C) (Oral)   Resp 18   LMP  (Within Months) Comment: January 2024  SpO2 96%   Physical Exam Constitutional:      General: She is not in acute distress.    Appearance: Normal appearance. She is well-developed. She is not ill-appearing, toxic-appearing or diaphoretic.  HENT:     Head: Normocephalic and atraumatic.     Nose: Nose normal.     Mouth/Throat:     Mouth: Mucous membranes are moist.  Eyes:     General: No scleral icterus.  Right eye: No discharge.        Left eye: No discharge.     Extraocular Movements: Extraocular movements intact.  Cardiovascular:     Rate and Rhythm: Normal rate.  Pulmonary:     Effort: Pulmonary effort is normal.  Musculoskeletal:     Left shoulder: Tenderness and bony tenderness present. No swelling, deformity, effusion, laceration or crepitus. Decreased range of motion. Normal strength.     Comments: Extensive guarding of the left shoulder.  Has associated left trapezius pain.  Skin:    General: Skin is warm and dry.  Neurological:     General: No focal deficit present.     Mental Status: She is alert and oriented to person, place, and time.  Psychiatric:        Mood and Affect: Mood normal.        Behavior: Behavior normal.     DG Shoulder Left  Result Date: 12/18/2022 CLINICAL DATA:  Left shoulder pain EXAM: LEFT SHOULDER - 2+ VIEW COMPARISON:  None Available.  FINDINGS: There is no evidence of fracture or dislocation. There is no evidence of arthropathy or other focal bone abnormality. Soft tissues are unremarkable. IMPRESSION: Negative. Electronically Signed   By: Davina Poke D.O.   On: 12/18/2022 16:55    Assessment and Plan :   PDMP not reviewed this encounter.  1. Left shoulder strain, initial encounter   2. Acute pain of left shoulder     Recommended conservative management for shoulder strain with NSAID and muscle relaxant.  She declined IM Toradol in clinic.  Provided her with a note to miss work Midwife.  Follow-up with an orthopedist as needed. Counseled patient on potential for adverse effects with medications prescribed/recommended today, ER and return-to-clinic precautions discussed, patient verbalized understanding.    Jaynee Eagles, [place] 12/18/22 [phone]

## 2022-12-24 ENCOUNTER — Telehealth: Payer: Medicaid Other | Admitting: Nurse Practitioner

## 2022-12-24 DIAGNOSIS — S46912D Strain of unspecified muscle, fascia and tendon at shoulder and upper arm level, left arm, subsequent encounter: Secondary | ICD-10-CM | POA: Diagnosis not present

## 2022-12-24 NOTE — Progress Notes (Signed)
Virtual Visit Consent   Stephanie Gutierrez, you are scheduled for a virtual visit with a [facility] provider today. Just as with appointments in the office, your consent must be obtained to participate. Your consent will be active for this visit and any virtual visit you may have with one of our providers in the next 365 days. If you have a MyChart account, a copy of this consent can be sent to you electronically.  As this is a virtual visit, video technology does not allow for your provider to perform a traditional examination. This may limit your provider's ability to fully assess your condition. If your provider identifies any concerns that need to be evaluated in person or the need to arrange testing (such as labs, EKG, etc.), we will make arrangements to do so. Although advances in technology are sophisticated, we cannot ensure that it will always work on either your end or our end. If the connection with a video visit is poor, the visit may have to be switched to a telephone visit. With either a video or telephone visit, we are not always able to ensure that we have a secure connection.  By engaging in this virtual visit, you consent to the provision of healthcare and authorize for your insurance to be billed (if applicable) for the services provided during this visit. Depending on your insurance coverage, you may receive a charge related to this service.  I need to obtain your verbal consent now. Are you willing to proceed with your visit today? Stephanie Gutierrez has provided verbal consent on 12/24/2022 for a virtual visit (video or telephone). Viviano Simas, FNP  Date: 12/24/2022 5:14 PM  Virtual Visit via Video Note   I, Viviano Simas, connected with  Stephanie Gutierrez  ([id number], [birth year]) on 12/24/22 at  5:15 PM EDT by a video-enabled telemedicine application and verified that I am speaking with the correct person using two identifiers.  Location: Patient: Virtual Visit Location Patient: Home Provider:  Virtual Visit Location Provider: Home Office   I discussed the limitations of evaluation and management by telemedicine and the availability of in person appointments. The patient expressed understanding and agreed to proceed.    History of Present Illness: Stephanie Gutierrez is a 32 y.o. who identifies as a female who was assigned female at birth, and is being seen today for ongoing left shoulder pain.  She was seen at [facility] on 12/18/22 after an injury at work  She has been using Baclofen and Naproxen for pain relief   She works for the [employer]   She has not returned to work since that time  She was given a muscle relaxant and NSAID   She last took Baclofen at 11 am today and is wondering if she can return to work tonight   She has not regained full ROM and is still in pain with movement   She has contacted HR but has not received information regarding workman's comp  Problems:  Patient Active Problem List   Diagnosis Date Noted   Rhabdomyolysis 07/10/2014    Allergies: No Known Allergies Medications:  Current Outpatient Medications:    amLODipine (NORVASC) 5 MG tablet, Take 1 tablet (5 mg total) by mouth daily., Disp: 30 tablet, Rfl: 1   baclofen (LIORESAL) 10 MG tablet, Take 1 tablet (10 mg total) by mouth 3 (three) times daily., Disp: 30 each, Rfl: 0   cyclobenzaprine (FLEXERIL) 5 MG tablet, Take 1 tablet (5 mg total) by mouth at bedtime as needed  for muscle spasms., Disp: 30 tablet, Rfl: 0   fluticasone (FLONASE) 50 MCG/ACT nasal spray, Place 2 sprays into both nostrils daily., Disp: 18 mL, Rfl: 0   ibuprofen (ADVIL) 600 MG tablet, Take 1 tablet (600 mg total) by mouth every 8 (eight) hours as needed., Disp: 30 tablet, Rfl: 0   meloxicam (MOBIC) 15 MG tablet, Take 1 tablet (15 mg total) by mouth daily., Disp: 30 tablet, Rfl: 1   naproxen (NAPROSYN) 500 MG tablet, Take 1 tablet (500 mg total) by mouth 2 (two) times daily with a meal., Disp: 30 tablet, Rfl: 0   omeprazole (PRILOSEC)  20 MG capsule, Take 1 capsule (20 mg total) by mouth daily., Disp: 30 capsule, Rfl: 3   ondansetron (ZOFRAN) 4 MG tablet, Take 1 tablet (4 mg total) by mouth every 8 (eight) hours as needed for nausea or vomiting., Disp: 20 tablet, Rfl: 0  Observations/Objective: Patient is well-developed, well-nourished in no acute distress.  Resting comfortably  at home.  Head is normocephalic, atraumatic.  No labored breathing.  Speech is clear and coherent with logical content.  Patient is alert and oriented at baseline.    Assessment and Plan: 1. Strain of left shoulder, subsequent encounter Due to ongoing pain and requiring muscle relaxant for relief discouraged returning to work without an in person evaluation of ROM and strength.   Cannot completed workman's comp evaluations via Telehealth currently She should contact HR and use the location they recommend  If she is unsure she may return to the UC she was seen at initially for re evaluation  She has plenty of Baclofen and Naproxen at this time     Advised patient she should not be driving or operating machinery while using Baclofen  Follow Up Instructions: I discussed the assessment and treatment plan with the patient. The patient was provided an opportunity to ask questions and all were answered. The patient agreed with the plan and demonstrated an understanding of the instructions.  A copy of instructions were sent to the patient via MyChart unless otherwise noted below.    The patient was advised to call back or seek an in-person evaluation if the symptoms worsen or if the condition fails to improve as anticipated.  Time:  I spent 20 minutes with the patient via telehealth technology discussing the above problems/concerns.    Viviano Simas, FNP

## 2022-12-25 ENCOUNTER — Ambulatory Visit
Admission: EM | Admit: 2022-12-25 | Discharge: 2022-12-25 | Disposition: A | Discharge status: Home / Self Care | Payer: Medicaid Other

## 2023-01-01 ENCOUNTER — Ambulatory Visit: Payer: Commercial Managed Care - PPO | Admitting: Family Medicine

## 2023-01-01 ENCOUNTER — Encounter: Payer: Self-pay | Admitting: Family Medicine

## 2023-01-01 VITALS — BP 150/94 | HR 92 | Ht 64.0 in | Wt 212.0 lb

## 2023-01-01 DIAGNOSIS — F419 Anxiety disorder, unspecified: Secondary | ICD-10-CM | POA: Insufficient documentation

## 2023-01-01 DIAGNOSIS — M25512 Pain in left shoulder: Secondary | ICD-10-CM

## 2023-01-01 DIAGNOSIS — F32A Depression, unspecified: Secondary | ICD-10-CM

## 2023-01-01 DIAGNOSIS — Z0000 Encounter for general adult medical examination without abnormal findings: Secondary | ICD-10-CM

## 2023-01-01 DIAGNOSIS — I10 Essential (primary) hypertension: Secondary | ICD-10-CM | POA: Diagnosis not present

## 2023-01-01 MED ORDER — AMLODIPINE BESYLATE 10 MG PO TABS
10.0000 mg | ORAL_TABLET | Freq: Every day | ORAL | 1 refills | Status: AC
Start: 2023-01-01 — End: ?

## 2023-01-01 NOTE — Patient Instructions (Addendum)
Increase amlodipine to 10 mg daily Recommend quit smoking Low sodium diet Regular exercise Follow-up in 2 weeks  Referral for counseling and possible medication for anxiety/depression  Referral to sports medicine for shoulder     Thank you for choosing [facility] at [facility] for your Primary Care needs. I am excited for the opportunity to partner with you to meet your health care goals. It was a pleasure meeting you today!  Information on diet, exercise, and health maintenance recommendations are listed below. This is information to help you be sure you are on track for optimal health and monitoring.   Please look over this and let us know if you have any questions or if you have completed any of the health maintenance outside of [facility] so that we can be sure your records are up to date.  ___________________________________________________________  MyChart:  For all urgent or time sensitive needs we ask that you please call the office to avoid delays. Our number is [phone]. MyChart is not constantly monitored and due to the large volume of messages a day, replies may take up to 72 business hours.  MyChart Policy: MyChart allows for you to see your visit notes, after visit summary, provider recommendations, lab and tests results, make an appointment, request refills, and contact your provider or the office for non-urgent questions or concerns. Providers are seeing patients during normal business hours and do not have built in time to review MyChart messages.  We ask that you allow a minimum of 3 business days for responses to [employer]. For this reason, please do not send urgent requests through MyChart. Please call the office at [phone]. New and ongoing conditions may require a visit. We have virtual and in-person visits available for your convenience.  Complex MyChart concerns may require a visit. Your provider may request you schedule a  virtual or in-person visit to ensure we are providing the best care possible. MyChart messages sent after 11:00 AM on Friday will not be received by the provider until Monday morning.    Lab and Test Results: You will receive your lab and test results on MyChart as soon as they are completed and results have been sent by the lab or testing facility. Due to this service, you will receive your results BEFORE your provider.  I review lab and test results each morning prior to seeing patients. Some results require collaboration with other providers to ensure you are receiving the most appropriate care. For this reason, we ask that you please allow a minimum of 3-5 business days from the time that ALL results have been received for your provider to receive and review lab and test results and contact you about these.  Most lab and test result comments from the provider will be sent through MyChart. Your provider may recommend changes to the plan of care, follow-up visits, repeat testing, ask questions, or request an office visit to discuss these results. You may reply directly to this message or call the office to provide information for the provider or set up an appointment. In some instances, you will be called with test results and recommendations. Please let us know if this is preferred and we will make note of this in your chart to provide this for you.    If you have not heard a response to your lab or test results in 5 business days from all results returning to MyChart, please call the office to let us know.  We ask that you please avoid calling prior to this time unless there is an emergent concern. Due to high call volumes, this can delay the resulting process.  After Hours: For all non-emergency after hours needs, please call the office at [phone] and select the option to reach the on-call  service. On-call services are shared between multiple [facility] offices and therefore it will not be  possible to speak directly with your provider. On-call providers may provide medical advice and recommendations, but are unable to provide refills for maintenance medications.  For all emergency or urgent medical needs after normal business hours, we recommend that you seek care at the closest Urgent Care or Emergency Department to ensure appropriate treatment in a timely manner.  [facility] has a 24 hour emergency room located on the ground floor for your convenience.   Urgent Concerns During the Business Day Providers are seeing patients from 8AM to 5PM with a busy schedule and are most often not able to respond to non-urgent calls until the end of the day or the next business day. If you should have URGENT concerns during the day, please call and speak to the nurse or schedule a same day appointment so that we can address your concern without delay.   Thank you, again, for choosing me as your health care partner. I appreciate your trust and look forward to learning more about you!   Lollie Marrow Reola Calkins, DNP, FNP-C  ___________________________________________________________  Health Maintenance Recommendations Screening Testing Mammogram Every 1-2 years based on history and risk factors Starting at age 30 Pap Smear Ages 21-39 every 3 years Ages 14-65 every 5 years with HPV testing More frequent testing may be required based on results and history Colon Cancer Screening Every 1-10 years based on test performed, risk factors, and history Starting at age 33 Bone Density Screening Every 2-10 years based on history Starting at age 5 for women Recommendations for men differ based on medication usage, history, and risk factors AAA Screening One time ultrasound Men 49-42 years old who have ever smoked Lung Cancer Screening Low Dose Lung CT every 12 months Age 23-80 years with a 20 pack-year smoking history who still smoke or who have quit within the last 15 years  Screening  Labs Routine  Labs: Complete Blood Count (CBC), Complete Metabolic Panel (CMP), Cholesterol (Lipid Panel) Every 6-12 months based on history and medications May be recommended more frequently based on current conditions or previous results Hemoglobin A1c Lab Every 3-12 months based on history and previous results Starting at age 25 or earlier with diagnosis of diabetes, high cholesterol, BMI >26, and/or risk factors Frequent monitoring for patients with diabetes to ensure blood sugar control Thyroid Panel  Every 6 months based on history, symptoms, and risk factors May be repeated more often if on medication HIV One time testing for all patients 46 and older May be repeated more frequently for patients with increased risk factors or exposure Hepatitis C One time testing for all patients 75 and older May be repeated more frequently for patients with increased risk factors or exposure Gonorrhea, Chlamydia Every 12 months for all sexually active persons 13-24 years Additional monitoring may be recommended for those who are considered high risk or who have symptoms PSA Men 47-87 years old with risk factors Additional screening may be recommended from age 9-69 based on risk factors, symptoms, and history  Vaccine Recommendations Tetanus Booster All adults every 10 years Flu Vaccine All patients 6 months  and older every year COVID Vaccine All patients 12 years and older Initial dosing with booster May recommend additional booster based on age and health history HPV Vaccine 2 doses all patients age 89-26 Dosing may be considered for patients over 26 Shingles Vaccine (Shingrix) 2 doses all adults 50 years and older Pneumonia (Pneumovax 23) All adults 65 years and older May recommend earlier dosing based on health history Pneumonia (Prevnar 42) All adults 65 years and older Dosed 1 year after Pneumovax 23 Pneumonia (Prevnar 20) All adults 65 years and older (adults 29-56 with  certain conditions or risk factors) 1 dose  For those who have not received Prevnar 13 vaccine previously   Additional Screening, Testing, and Vaccinations may be recommended on an individualized basis based on family history, health history, risk factors, and/or exposure.  __________________________________________________________  Diet Recommendations for All Patients  I recommend that all patients maintain a diet low in saturated fats, carbohydrates, and cholesterol. While this can be challenging at first, it is not impossible and small changes can make big differences.  Things to try: Decreasing the amount of soda, sweet tea, and/or juice to one or less per day and replace with water While water is always the first choice, if you do not like water you may consider adding a water additive without sugar to improve the taste other sugar free drinks Replace potatoes with a brightly colored vegetable  Use healthy oils, such as canola oil or olive oil, instead of butter or hard margarine Limit your bread intake to two pieces or less a day Replace regular pasta with low carb pasta options Bake, broil, or grill foods instead of frying Monitor portion sizes  Eat smaller, more frequent meals throughout the day instead of large meals  An important thing to remember is, if you love foods that are not great for your health, you don't have to give them up completely. Instead, allow these foods to be a reward when you have done well. Allowing yourself to still have special treats every once in a while is a nice way to tell yourself thank you for working hard to keep yourself healthy.   Also remember that every day is a new day. If you have a bad day and "fall off the wagon", you can still climb right back up and keep moving along on your journey!  We have resources available to help you!  Some websites that may be helpful  include: www.http://www.wall-moore.info/  Www.VeryWellFit.com _____________________________________________________________  Activity Recommendations for All Patients  I recommend that all adults get at least 20 minutes of moderate physical activity that elevates your heart rate at least 5 days out of the week.  Some examples include: Walking or jogging at a pace that allows you to carry on a conversation Cycling (stationary bike or outdoors) Water aerobics Yoga Weight lifting Dancing If physical limitations prevent you from putting stress on your joints, exercise in a pool or seated in a chair are excellent options.  Do determine your MAXIMUM heart rate for activity: 220 - YOUR AGE = MAX Heart Rate   Remember! Do not push yourself too hard.  Start slowly and build up your pace, speed, weight, time in exercise, etc.  Allow your body to rest between exercise and get good sleep. You will need more water than normal when you are exerting yourself. Do not wait until you are thirsty to drink. Drink with a purpose of getting in at least 8, 8 ounce glasses of water  a day plus more depending on how much you exercise and sweat.    If you begin to develop dizziness, chest pain, abdominal pain, jaw pain, shortness of breath, headache, vision changes, lightheadedness, or other concerning symptoms, stop the activity and allow your body to rest. If your symptoms are severe, seek emergency evaluation immediately. If your symptoms are concerning, but not severe, please let us know so that we can recommend further evaluation.

## 2023-01-01 NOTE — Assessment & Plan Note (Signed)
Increase amlodipine to 10 mg daily Recommend quit smoking Low sodium diet Regular exercise Follow-up in 2 weeks

## 2023-01-01 NOTE — Assessment & Plan Note (Signed)
No SI/HI She prefers to find out what medication she did not tolerate in the past - she will reach out to her mom to see if she remembers.  She would be interested in medication in the future, but she would like to start with [facility] referral for now - med management and counseling.  Patient aware of signs/symptoms requiring further/urgent evaluation.

## 2023-01-01 NOTE — Progress Notes (Signed)
New Patient Office Visit  Subjective    Patient ID: Stephanie Gutierrez, female    DOB: 12-Jan-1991  Age: 32 y.o. MRN: [id number]  CC:  Chief Complaint  Patient presents with   Establish Care    HPI Stephanie Gutierrez presents to establish care. She has not had primary care in many years.  She works at the [employer]. She lives with a friend right now.    Hypertension: - Medications: amlodipine 5 mg daily - Compliance: good (has been getting occasionally from urgent cares) - Checking BP at home: no - Denies any SOB, recurrent headaches, CP, vision changes, LE edema, dizziness, palpitations, or medication side effects. - Diet: general - Exercise: no   Left shoulder pain: - Patient went to urgent care on 12/25/22 for left shoulder pain following work injury - trying to lift heavy tire. Xray was normal. She was given Meloxicam and Flexeril but she hasn't noticed much improvement (states she noticed one episode of blood when wiping after BM so she stopped the Meloxicam. No other episodes and declines further workup right now - will let us know if it happens again). States the shoulder pain is generalized, 4/10 at rest and 8/10 with any extension, rotation. Reports she has been trying to keep it still best she can. No numbness, tingling, radiation, rashes, bruising, swelling.     Anxiety and depression: - states she tried something when she was a teenager, but it gave her suicidal thoughts (not sure what med) so she hasn't tried anything since then, but she is open to psych referral for counseling and med management - no SI/HI - she is unsure of family history of mood disorder   Outpatient Encounter Medications as of 01/01/2023  Medication Sig   fluticasone (FLONASE) 50 MCG/ACT nasal spray Place 2 sprays into both nostrils daily.   ibuprofen (ADVIL) 600 MG tablet Take 1 tablet (600 mg total) by mouth every 8 (eight) hours as needed.   meloxicam (MOBIC) 15 MG tablet Take 1 tablet (15 mg total)  by mouth daily.   [DISCONTINUED] amLODipine (NORVASC) 5 MG tablet Take 1 tablet (5 mg total) by mouth daily.   amLODipine (NORVASC) 10 MG tablet Take 1 tablet (10 mg total) by mouth daily.   [DISCONTINUED] baclofen (LIORESAL) 10 MG tablet Take 1 tablet (10 mg total) by mouth 3 (three) times daily.   [DISCONTINUED] cyclobenzaprine (FLEXERIL) 5 MG tablet Take 1 tablet (5 mg total) by mouth at bedtime as needed for muscle spasms.   [DISCONTINUED] ferrous sulfate 325 (65 FE) MG tablet Take 1 tablet (325 mg total) by mouth 2 (two) times daily with a meal. (Patient not taking: Reported on 06/09/2018)   [DISCONTINUED] naproxen (NAPROSYN) 500 MG tablet Take 1 tablet (500 mg total) by mouth 2 (two) times daily with a meal.   [DISCONTINUED] norgestimate-ethinyl estradiol (SPRINTEC 28) 0.25-35 MG-MCG tablet Take 1 tablet by mouth daily. (Patient not taking: Reported on 06/09/2018)   [DISCONTINUED] omeprazole (PRILOSEC) 20 MG capsule Take 1 capsule (20 mg total) by mouth daily.   [DISCONTINUED] ondansetron (ZOFRAN) 4 MG tablet Take 1 tablet (4 mg total) by mouth every 8 (eight) hours as needed for nausea or vomiting.   No facility-administered encounter medications on file as of 01/01/2023.    Past Medical History:  Diagnosis Date   Anxiety    Depression    Gallstones    Hypertension    not on meds    Obesity     Past Surgical History:  Procedure Laterality Date   CHOLECYSTECTOMY N/A 05/15/2020   Procedure: LAPAROSCOPIC CHOLECYSTECTOMY WITH  INTRAOPERATIVE CHOLANGIOGRAM;  Surgeon: Gaynelle Adu, MD;  Location: [facility];  Service: General;  Laterality: N/A;    Family History  Problem Relation Age of Onset   Hypertension Mother    Obesity Mother    Hypertension Father    Heart attack Maternal Grandmother    Diabetes Mellitus II Paternal Grandmother    CAD Other    Diabetes Mellitus II Other     Social History   Socioeconomic History   Marital status: Single    Spouse name: Not on file    Number of children: 0   Years of education: 12   Highest education level: Not on file  Occupational History   Not on file  Tobacco Use   Smoking status: Every Day    Packs/day: .25    Types: Cigarettes   Smokeless tobacco: Never  Vaping Use   Vaping Use: Never used  Substance and Sexual Activity   Alcohol use: Yes    Comment: Special ocassions   Drug use: No   Sexual activity: Not Currently    Birth control/protection: None  Other Topics Concern   Not on file  Social History Narrative   Not on file   Social Determinants of Health   Financial Resource Strain: Not on file  Food Insecurity: Not on file  Transportation Needs: Not on file  Physical Activity: Not on file  Stress: Not on file  Social Connections: Not on file  Intimate Partner Violence: Not on file    ROS All review of systems negative except what is listed in the HPI      Objective    BP (!) 150/94   Pulse 92   Ht 5\' 4"  (1.626 m)   Wt 212 lb (96.2 kg)   LMP  (Within Months)   SpO2 97%   BMI 36.39 kg/m   Physical Exam Vitals reviewed.  Constitutional:      Appearance: Normal appearance.  Cardiovascular:     Rate and Rhythm: Normal rate and regular rhythm.     Pulses: Normal pulses.     Heart sounds: Normal heart sounds.  Pulmonary:     Effort: Pulmonary effort is normal.     Breath sounds: Normal breath sounds.  Musculoskeletal:        General: Tenderness present.     Right lower leg: No edema.     Left lower leg: No edema.     Comments: Let shoulder with generalized tenderness to palpation, decreased active/passive ROM due to pain, unable to complete exam due to pain  Skin:    General: Skin is warm and dry.  Neurological:     Mental Status: She is alert and oriented to person, place, and time.  Psychiatric:        Mood and Affect: Mood normal.        Behavior: Behavior normal.        Thought Content: Thought content normal.        Judgment: Judgment normal.         Assessment  & Plan:   Problem List Items Addressed This Visit     Primary hypertension    Increase amlodipine to 10 mg daily Recommend quit smoking Low sodium diet Regular exercise Follow-up in 2 weeks      Relevant Medications   amLODipine (NORVASC) 10 MG tablet   Anxiety and depression    No SI/HI She prefers  to find out what medication she did not tolerate in the past - she will reach out to her mom to see if she remembers.  She would be interested in medication in the future, but she would like to start with [facility] referral for now - med management and counseling.  Patient aware of signs/symptoms requiring further/urgent evaluation.       Relevant Orders   Ambulatory referral to Behavioral Health   Other Visit Diagnoses     Encounter for medical examination to establish care    -  Primary    Acute pain of left shoulder     Discussed rest, ice, heat, stretching - encouraged to stay active to prevent frozen shoulder Referral to sports medicine    Relevant Orders   Ambulatory referral to Sports Medicine       Return for physical with labs and pap at your convenience ; 2 week blood pressure follow-up.   Clayborne Dana, NP

## 2023-01-07 ENCOUNTER — Ambulatory Visit: Payer: Commercial Managed Care - PPO | Admitting: Sports Medicine

## 2023-01-15 ENCOUNTER — Ambulatory Visit (INDEPENDENT_AMBULATORY_CARE_PROVIDER_SITE_OTHER): Payer: Commercial Managed Care - PPO | Admitting: Family Medicine

## 2023-01-15 ENCOUNTER — Telehealth: Payer: Self-pay | Admitting: *Deleted

## 2023-01-15 DIAGNOSIS — I10 Essential (primary) hypertension: Secondary | ICD-10-CM

## 2023-01-15 MED ORDER — HYDROCHLOROTHIAZIDE 12.5 MG PO CAPS: 12.5000 mg | ORAL_CAPSULE | Freq: Every day | ORAL | 0 refills | Status: DC

## 2023-01-15 NOTE — Progress Notes (Signed)
Pt here for Blood pressure check per Ladona Ridgel.  Pt currently takes: amlodipine 10mg  (which was increased at last visit.)  Headaches, cp   Pt reports compliance with medication.  BP Readings from Last 3 Encounters:  01/01/23 (!) 150/94  12/18/22 (!) 138/94  11/06/21 (!) 145/97     BP today Right arm 146/100  HR 91 Left arm 140/100  HR 95  Recheck Right arm 142/100 HR 89 Left arm 140/98 HR 94  Pt advised per Ladona Ridgel to add hctz 12.5mg  and recheck with her in 2-4 weeks.  Medication sent into pharmacy and follow up appointment made.

## 2023-01-15 NOTE — Telephone Encounter (Signed)
Pt came in for nv and she stated that she would like a note to return to work.  Her shoulder is feeling better since taking the muscle relaxant.  She did not go to sport med appointment because she did not have ride.    Per Ladona Ridgel ok to give note to go back to work.  Letter sent to patient via mychart per her request.

## 2023-01-16 ENCOUNTER — Other Ambulatory Visit: Payer: Self-pay | Admitting: *Deleted

## 2023-01-16 MED ORDER — HYDROCHLOROTHIAZIDE 12.5 MG PO CAPS: 12.5000 mg | ORAL_CAPSULE | Freq: Every day | ORAL | 0 refills | Status: DC

## 2023-02-16 ENCOUNTER — Ambulatory Visit: Payer: Commercial Managed Care - PPO | Admitting: Family Medicine

## 2023-02-20 ENCOUNTER — Other Ambulatory Visit: Payer: Self-pay | Admitting: Family Medicine

## 2023-02-26 ENCOUNTER — Ambulatory Visit: Payer: Commercial Managed Care - PPO | Admitting: Family Medicine

## 2023-02-26 DIAGNOSIS — I10 Essential (primary) hypertension: Secondary | ICD-10-CM

## 2023-04-02 ENCOUNTER — Encounter: Payer: Commercial Managed Care - PPO | Admitting: Family Medicine

## 2023-04-02 DIAGNOSIS — Z0000 Encounter for general adult medical examination without abnormal findings: Secondary | ICD-10-CM

## 2023-04-16 ENCOUNTER — Telehealth: Payer: Medicaid Other | Admitting: Physician Assistant

## 2023-04-16 DIAGNOSIS — R0602 Shortness of breath: Secondary | ICD-10-CM

## 2023-04-16 NOTE — Progress Notes (Signed)
Based on what you shared with me, I feel your condition warrants further evaluation as soon as possible at an Emergency department. Giving sensation of heartburn in the middle of the chest along with shortness of breath, you need an in-person evaluation ASAP this evening.    NOTE: There will be NO CHARGE for this eVisit   If you are having a true medical emergency please call 911.      Emergency Department-[facility]  Get Driving Directions  [phone]  [address]  [place], [place] [address]  Open 24/7/365      [facility] at [facility]  Get Driving Directions  [address]  [place], [place] [address]  Open 24/7/365    Emergency Department- [facility]  Get Driving Directions  [phone]  [address]  [place], [place] [address]  Open 24/7/365      [facility]  Get Driving Directions  [phone]  [address]  [place], [place] [address]  Open 24/7/365    [facility]  Emergency Department- [facility]  Get Driving Directions  [phone]  [address]  [place], [place] [address]  Open 24/7/365    [place]  Emergency Department- [facility]  Get Driving Directions  [address]  [place], [place] [address]  Open 24/7/365    [facility]  Emergency Department- [facility]  Get Driving Directions  [phone]  [address]  [place], [place] [address]  Open 24/7/365

## 2023-05-13 ENCOUNTER — Ambulatory Visit: Payer: Self-pay | Admitting: Family Medicine

## 2023-05-22 ENCOUNTER — Encounter: Payer: Self-pay | Admitting: Pharmacist

## 2023-05-22 ENCOUNTER — Telehealth: Payer: Self-pay

## 2023-05-22 NOTE — Telephone Encounter (Signed)
Initial Comment Caller states her blood pressure is high. Translation No Nurse Assessment Nurse: Edson Snowball, RN, Alice Date/Time (Eastern Time): 05/22/2023 3:25:49 PM Confirm and document reason for call. If symptomatic, describe symptoms. ---caller states their blood pressure was high was one hundred something/high 100s at [facility] earlier days ago. feels chest discomfort for the last hour. Does the patient have any new or worsening symptoms? ---Yes Will a triage be completed? ---Yes Related visit to physician within the last 2 weeks? ---No Does the PT have any chronic conditions? (i.e. diabetes, asthma, this includes High risk factors for pregnancy, etc.) ---Yes List chronic conditions. ---high blood pressure Is the patient pregnant or possibly pregnant? (Ask all females between the ages of 103-55) ---No Is this a behavioral health or substance abuse call? ---No Guidelines Guideline Title Affirmed Question Affirmed Notes Nurse Date/Time (Eastern Time) Chest Pain [1] Chest pain lasts > 5 minutes AND [2] age > 30 AND [3] one or more cardiac risk factors (e.g., diabetes, high blood pressure, high cholesterol, smoker, Edson Snowball, RN, Fulton Mole 05/22/2023 3:31:44 PM PLEASE NOTE: All timestamps contained within this report are represented as [place] Standard Time. CONFIDENTIALTY NOTICE: This fax transmission is intended only for the addressee. It contains information that is legally privileged, confidential or otherwise protected from use or disclosure. If you are not the intended recipient, you are strictly prohibited from reviewing, disclosing, copying using or disseminating any of this information or taking any action in reliance on or regarding this information. If you have received this fax in error, please notify us immediately by telephone so that we can arrange for its return to [place]. Phone: [phone], Toll-Free: [phone], Fax: [phone] Page: 2 of 2 Call Id:  [id number] Guidelines Guideline Title Affirmed Question Affirmed Notes Nurse Date/Time Lamount Cohen Time) or strong family history of heart disease) Disp. Time Lamount Cohen Time) Disposition Final User 05/22/2023 3:33:16 PM Call EMS 911 Now Yes Jolyne Loa 05/22/2023 3:34:26 PM 911 Outcome Documentation Edson Snowball, RN, Fulton Mole Reason: caller refused to call 911, caller informed these are urgent symptoms that should be evaluated now. Final Disposition 05/22/2023 3:33:16 PM Call EMS 911 Now Yes Edson Snowball, RN, Jannett Celestine Disagree/Comply Disagree Caller Understands Yes PreDisposition InappropriateToAsk Care Advice Given Per Guideline CALL EMS 911 NOW: * Immediate medical attention is needed. You need to hang up and call 911 (or an ambulance). CARE ADVICE given per Chest Pain (Adult) guideline

## 2023-05-25 NOTE — Telephone Encounter (Signed)
Spoke with patient to check on her. She did not go to ED to be seen. She states she did not take her blood pressure medication when it was running very high. She states bottom number still in the 90s. Chest discomfort is better. She has appt in two days here and does not want to seek care prior to that appt. Encouraged her to keep visit with Ladona Ridgel.  FYI.

## 2023-05-25 NOTE — Telephone Encounter (Signed)
Noted! Thank you

## 2023-05-27 ENCOUNTER — Ambulatory Visit: Payer: Medicaid Other | Admitting: Family Medicine

## 2023-05-27 DIAGNOSIS — F32A Depression, unspecified: Secondary | ICD-10-CM

## 2023-05-27 DIAGNOSIS — I10 Essential (primary) hypertension: Secondary | ICD-10-CM

## 2023-05-29 ENCOUNTER — Ambulatory Visit: Payer: Medicaid Other | Admitting: Family Medicine

## 2023-06-01 ENCOUNTER — Emergency Department (HOSPITAL_COMMUNITY)
Admission: EM | Admit: 2023-06-01 | Discharge: 2023-06-01 | Disposition: A | Discharge status: Home / Self Care | Payer: Medicaid Other | Attending: Emergency Medicine | Admitting: Emergency Medicine

## 2023-06-01 ENCOUNTER — Ambulatory Visit (INDEPENDENT_AMBULATORY_CARE_PROVIDER_SITE_OTHER): Payer: Medicaid Other | Admitting: Otolaryngology

## 2023-06-01 ENCOUNTER — Encounter (HOSPITAL_COMMUNITY): Payer: Self-pay

## 2023-06-01 ENCOUNTER — Encounter (INDEPENDENT_AMBULATORY_CARE_PROVIDER_SITE_OTHER): Payer: Self-pay | Admitting: Otolaryngology

## 2023-06-01 VITALS — BP 149/101 | HR 78 | Ht 64.0 in | Wt 220.0 lb

## 2023-06-01 DIAGNOSIS — T162XXA Foreign body in left ear, initial encounter: Secondary | ICD-10-CM | POA: Insufficient documentation

## 2023-06-01 DIAGNOSIS — I10 Essential (primary) hypertension: Secondary | ICD-10-CM | POA: Insufficient documentation

## 2023-06-01 DIAGNOSIS — X58XXXA Exposure to other specified factors, initial encounter: Secondary | ICD-10-CM | POA: Insufficient documentation

## 2023-06-01 DIAGNOSIS — H9202 Otalgia, left ear: Secondary | ICD-10-CM

## 2023-06-01 MED ORDER — OXYCODONE-ACETAMINOPHEN 5-325 MG PO TABS
1.0000 | ORAL_TABLET | Freq: Once | ORAL | Status: AC
  Administered 2023-06-01: 1 via ORAL
  Filled 2023-06-01: qty 1

## 2023-06-01 MED ORDER — MINERAL OIL RE ENEM
1.0000 | ENEMA | Freq: Once | RECTAL | Status: AC
  Administered 2023-06-01: 1 via RECTAL
  Filled 2023-06-01: qty 1

## 2023-06-01 MED ORDER — OXYCODONE-ACETAMINOPHEN 5-325 MG PO TABS
1.0000 | ORAL_TABLET | Freq: Four times a day (QID) | ORAL | 0 refills | Status: AC | PRN
Start: 2023-06-01 — End: ?

## 2023-06-01 MED ORDER — LIDOCAINE HCL (PF) 1 % IJ SOLN
30.0000 mL | Freq: Once | INTRAMUSCULAR | Status: AC
  Administered 2023-06-01: 30 mL
  Filled 2023-06-01: qty 30

## 2023-06-01 NOTE — Patient Instructions (Signed)
Return if you develop ear pain or drainage

## 2023-06-01 NOTE — Discharge Instructions (Signed)
You have been seen today for your complaint of foreign body in your ear. Your discharge medications include Percocet. This is an opioid pain medication. You should only take this medication as needed for severe pain. You should not drive, operate heavy machinery or make important decisions while taking this medication. You should use alternative methods for pain relief while taking this medication including stretching, gentle range of motion, and alternating tylenol and ibuprofen. Follow up with: [place] [facility].  Call the phone number listed in this packet to schedule appointment for today Please seek immediate medical care if you develop any of the following symptoms: You notice blood or fluid coming from your ear. You have sudden hearing loss. At this time there does not appear to be the presence of an emergent medical condition, however there is always the potential for conditions to change. Please read and follow the below instructions.  Do not take your medicine if  develop an itchy rash, swelling in your mouth or lips, or difficulty breathing; call 911 and seek immediate emergency medical attention if this occurs.  You may review your lab tests and imaging results in their entirety on your MyChart account.  Please discuss all results of fully with your primary care provider and other specialist at your follow-up visit.  Note: Portions of this text may have been transcribed using voice recognition software. Every effort was made to ensure accuracy; however, inadvertent computerized transcription errors may still be present.

## 2023-06-01 NOTE — Progress Notes (Signed)
ENT CONSULT:  Reason for Consult: Left ear foreign body/insect  HPI: Stephanie Gutierrez is an 32 y.o. female with history of anxiety depression and high blood pressure here after being seen in the ED for an insect in the left ear canal.  She reports a bug flew into her ear when she was standing next to a lamp.  She reports some ear pain, but no drainage.  Denies hearing changes.  She went to the ED today where mineral oil was used to flush the bug, but reportedly remained in the ear and she is here for removal.  Records Reviewed:  ED note from today - noted to have a bug in the left ear canal  Stephanie Gutierrez is a 32 y.o. female.  With history of anxiety, depression, hypertension presenting for evaluation of foreign body to the left ear.  She states she was standing by a light when she felt a bug fly into her ear.  She reports significant pain every time she feels the bug move.  No hearing loss.  Mineral Oil flushing of the left ear was done     Past Medical History:  Diagnosis Date   Anxiety    Depression    Gallstones    Hypertension    not on meds    Obesity     Past Surgical History:  Procedure Laterality Date   CHOLECYSTECTOMY N/A 05/15/2020   Procedure: LAPAROSCOPIC CHOLECYSTECTOMY WITH  INTRAOPERATIVE CHOLANGIOGRAM;  Surgeon: Gaynelle Adu, MD;  Location: [facility];  Service: General;  Laterality: N/A;    Family History  Problem Relation Age of Onset   Hypertension Mother    Obesity Mother    Hypertension Father    Heart attack Maternal Grandmother    Diabetes Mellitus II Paternal Grandmother    CAD Other    Diabetes Mellitus II Other     Social History:  reports that she has been smoking cigarettes. She has never used smokeless tobacco. She reports current alcohol use. She reports that she does not use drugs.  Allergies: No Known Allergies  Medications: I have reviewed the patient's current medications.  The PMH, PSH, Medications, Allergies, and SH were reviewed and  updated.  ROS: Constitutional: Negative for fever, weight loss and weight gain. Cardiovascular: Negative for chest pain and dyspnea on exertion. Respiratory: Is not experiencing shortness of breath at rest. Gastrointestinal: Negative for nausea and vomiting. Neurological: Negative for headaches. Psychiatric: The patient is not nervous/anxious  Blood pressure (!) 149/101, pulse 78, height 5\' 4"  (1.626 m), weight 220 lb (99.8 kg), SpO2 99%.  PHYSICAL EXAM:  Exam: General: Well-developed, well-nourished Respiratory Respiratory effort: Equal inspiration and expiration without stridor Cardiovascular Peripheral Vascular: Warm extremities with equal color/perfusion Eyes: No nystagmus with equal extraocular motion bilaterally Neuro/Psych/Balance: Patient oriented to person, place, and time; Appropriate mood and affect; Gait is intact with no imbalance; Cranial nerves I-XII are intact Head and Face Inspection: Normocephalic and atraumatic without mass or lesion Facial Strength: Facial motility symmetric and full bilaterally ENT Pinna: External ear intact and fully developed External canal: Canal is patent with intact skin - insect in oil residue along the right aspect of the TM on the left, removed from the canal - see procedure below  Tympanic Membrane: Clear and mobile External Nose: No scar or anatomic deformity Internal Nose: Septum intact on anterior rhinoscopy Lips, Teeth, and gums: Mucosa and teeth intact and viable TMJ: No pain to palpation with full mobility Oral cavity/oropharynx: No erythema or exudate, no lesions  present Neck Neck and Trachea: Midline trachea without mass or lesion Thyroid: No mass or nodularity Lymphatics: No lymphadenopathy  Procedure: Procedure: foreign body removal, left (CPT 69200)  Diagnosis: foreign body (insect) left ear canal  Informed consent: Timeout performed and informed consent was obtained.  Procedure: Operating microscope was employed  to evaluate the ear(s).  Alligator forceps and suction were used to successfully remove an insect from the ear canal on the left  Findings: Normal appearing tympanic membrane on the left without perforations, and external canals are normal after removal of cerumen.No middle ear fluid bilaterally.  Right ear canal and eardrum appear normal as well.   Complications: None. Patient tolerated well.       Studies Reviewed:none  Assessment/Plan: Encounter Diagnoses  Name Primary?   Foreign body of left ear, initial encounter [T16.2XXA] Yes   Left ear pain     Left ear foreign body removed today, following removal of the insect from the left ear the ear canal appears to be intact without evidence of inflammation or trauma, TM fully intact, no indication for topical treatment with antibiotic eardrops.  -I discussed with the patient that she should return if she develops worsening ear pain or drainage will consider eardrops at the time of return visit -All questions have been answered  Thank you for allowing me to participate in the care of this patient. Please do not hesitate to contact me with any questions or concerns.   Ashok Croon, MD Otolaryngology [facility] Phone: [phone] Fax: [phone]    06/01/2023, 2:44 PM

## 2023-06-01 NOTE — ED Triage Notes (Signed)
Pt arrived POV for possible insect that flew into her left ear PTA. Pt reports severe ear pain, can feel the bug moving around, reports very uncomfortable.

## 2023-06-01 NOTE — ED Provider Notes (Signed)
[facility] Provider Note   CSN: [id number] Arrival date & time: 06/01/23  0216     History  Chief Complaint  Patient presents with   Foreign Body in Ear    Stephanie Gutierrez is a 32 y.o. female.  With history of anxiety, depression, hypertension presenting for evaluation of foreign body to the left ear.  She states she was standing by a light when she felt a bug fly into her ear.  She reports significant pain every time she feels the bug move.  No hearing loss.   Foreign Body in Ear       Home Medications Prior to Admission medications   Medication Sig Start Date End Date Taking? Authorizing Provider  oxyCODONE-acetaminophen (PERCOCET/ROXICET) 5-325 MG tablet Take 1 tablet by mouth every 6 (six) hours as needed for severe pain. 06/01/23  Yes Zyair Russi, Edsel Petrin, PA-C  amLODipine (NORVASC) 10 MG tablet Take 1 tablet (10 mg total) by mouth daily. 01/01/23   Clayborne Dana, NP  diclofenac (VOLTAREN) 75 MG EC tablet Take 75 mg by mouth 2 (two) times daily as needed. 02/06/23   [provider]  fluticasone (FLONASE) 50 MCG/ACT nasal spray Place 2 sprays into both nostrils daily. 11/06/21   Theadora Rama Scales, PA-C  hydrochlorothiazide (MICROZIDE) 12.5 MG capsule Take 1 capsule by mouth once daily 02/23/23   Hyman Hopes B, NP  ibuprofen (ADVIL) 600 MG tablet Take 1 tablet (600 mg total) by mouth every 8 (eight) hours as needed. 06/09/22   Margaretann Loveless, PA-C  meloxicam (MOBIC) 15 MG tablet Take 1 tablet (15 mg total) by mouth daily. 12/18/22   Wallis Bamberg, PA-C  tiZANidine (ZANAFLEX) 4 MG tablet Take 4 mg by mouth every 8 (eight) hours as needed. 01/30/23   [provider]      Allergies    Patient has no known allergies.    Review of Systems   Review of Systems  HENT:  Positive for ear pain.   All other systems reviewed and are negative.   Physical Exam Updated Vital Signs BP (!) 171/107 (BP Location: Right Arm)    Pulse 73   Temp 97.9 F (36.6 C) (Oral)   Resp 16   Ht 5\' 4"  (1.626 m)   Wt 99.8 kg   SpO2 100%   BMI 37.76 kg/m  Physical Exam Vitals and nursing note reviewed.  Constitutional:      General: She is not in acute distress.    Appearance: Normal appearance. She is normal weight. She is not ill-appearing.  HENT:     Head: Normocephalic and atraumatic.     Right Ear: Tympanic membrane and ear canal normal.     Ears:     Comments: Foreign body inside the left ear canal adjacent to tympanic membrane favored to be insect.  No bleeding in the canal.  TM intact. Pulmonary:     Effort: Pulmonary effort is normal. No respiratory distress.  Abdominal:     General: Abdomen is flat.  Musculoskeletal:        General: Normal range of motion.     Cervical back: Neck supple.  Skin:    General: Skin is warm and dry.  Neurological:     Mental Status: She is alert and oriented to person, place, and time.  Psychiatric:        Mood and Affect: Mood normal.        Behavior: Behavior normal.  ED Results / Procedures / Treatments   Labs (all labs ordered are listed, but only abnormal results are displayed) Labs Reviewed - No data to display  EKG None  Radiology No results found.  Procedures Procedures    Medications Ordered in ED Medications  mineral oil enema 1 enema (1 enema Rectal Given 06/01/23 0318)  lidocaine (PF) (XYLOCAINE) 1 % injection 30 mL (30 mLs Other Given 06/01/23 0355)  oxyCODONE-acetaminophen (PERCOCET/ROXICET) 5-325 MG per tablet 1 tablet (1 tablet Oral Given 06/01/23 0530)    ED Course/ Medical Decision Making/ A&P                                 Medical Decision Making Risk OTC drugs. Prescription drug management.   This patient presents to the ED for concern of foreign body in left ear, this involves an extensive number of treatment options, and is a complaint that carries with it a high risk of complications and morbidity.  The differential  diagnosis includes foreign body, otitis media, otitis externa, mastoiditis, ruptured TM, barotrauma  Additional history obtained from: Nursing notes from this visit.  Consultations Obtained:   I requested consultation with the ENT Dr. Jearld Fenton,  and discussed lab and imaging findings as well as pertinent plan - they recommend: Office evaluation later today  Afebrile, hemodynamically stable.  32 year old female presenting for evaluation of foreign body sensation to the left ear.  On physical exam, there does appear to be an insect lodged in the left ear, adjacent to the tympanic membrane.  She reported significant pain with them insect removed but otherwise is pain-free.  No changes to her hearing.  Mineral oil was instilled to kill the insect.  This was successful.  Attempted irrigation of the canal with 2 L of water which was unsuccessful in removing the insect.  Then attempted manual removal with forceps.  This was not tolerated by patient and terminated at patient request.  Consulted with ENT who recommends office evaluation.  Patient was given contact information and encouraged to call today to schedule an appointment for as soon as possible.  She was sent a short course of Percocet for her acute pain and educated on potential side effects.  She was given return precautions.  Stable at discharge.  At this time there does not appear to be any evidence of an acute emergency medical condition and the patient appears stable for discharge with appropriate outpatient follow up. Diagnosis was discussed with patient who verbalizes understanding of care plan and is agreeable to discharge. I have discussed return precautions with patient who verbalizes understanding. Patient encouraged to follow-up with ENT as soon as possible. All questions answered.  Note: Portions of this report may have been transcribed using voice recognition software. Every effort was made to ensure accuracy; however, inadvertent  computerized transcription errors may still be present.        Final Clinical Impression(s) / ED Diagnoses Final diagnoses:  Foreign body of left ear, initial encounter    Rx / DC Orders ED Discharge Orders          Ordered    oxyCODONE-acetaminophen (PERCOCET/ROXICET) 5-325 MG tablet  Every 6 hours PRN        06/01/23 0543              Michelle Piper, PA-C 06/01/23 0547    Tilden Fossa, MD 06/01/23 [phone]

## 2023-06-23 ENCOUNTER — Ambulatory Visit (HOSPITAL_BASED_OUTPATIENT_CLINIC_OR_DEPARTMENT_OTHER): Payer: Medicaid Other | Admitting: Family Medicine

## 2023-06-24 ENCOUNTER — Other Ambulatory Visit: Payer: Self-pay | Admitting: Family Medicine

## 2023-08-06 ENCOUNTER — Encounter (HOSPITAL_BASED_OUTPATIENT_CLINIC_OR_DEPARTMENT_OTHER): Payer: Self-pay | Admitting: Family Medicine

## 2023-08-06 ENCOUNTER — Telehealth: Payer: Medicaid Other | Admitting: Physician Assistant

## 2023-08-06 DIAGNOSIS — K529 Noninfective gastroenteritis and colitis, unspecified: Secondary | ICD-10-CM | POA: Diagnosis not present

## 2023-08-06 NOTE — Progress Notes (Signed)
I have spent 5 minutes in review of e-visit questionnaire, review and updating patient chart, medical decision making and response to patient.   Mia Milan Cody Jacklynn Dehaas, PA-C    

## 2023-08-06 NOTE — Progress Notes (Signed)
E-Visit for Diarrhea  We are sorry that you are not feeling well.  Here is how we plan to help!  Based on what you have shared with me it looks like you have Acute Infectious Diarrhea.  Most cases of acute diarrhea are due to infections with virus and bacteria and are self-limited conditions lasting less than 14 days.  For your symptoms you may take Imodium 2 mg tablets that are over the counter at your local pharmacy. Take two tablet now and then one after each loose stool up to 6 a day.  Antibiotics are not needed for most people with diarrhea.   HOME CARE We recommend changing your diet to help with your symptoms for the next few days. Drink plenty of fluids that contain water salt and sugar. Sports drinks such as Gatorade may help.  You may try broths, soups, bananas, applesauce, soft breads, mashed potatoes or crackers.  You are considered infectious for as long as the diarrhea continues. Hand washing or use of alcohol based hand sanitizers is recommend. It is best to stay out of work or school until your symptoms stop.   GET HELP RIGHT AWAY If you have dark yellow colored urine or do not pass urine frequently you should drink more fluids.   If your symptoms worsen  If you feel like you are going to pass out (faint) You have a new problem  MAKE SURE YOU  Understand these instructions. Will watch your condition. Will get help right away if you are not doing well or get worse.  Thank you for choosing an e-visit.  Your e-visit answers were reviewed by a board certified advanced clinical practitioner to complete your personal care plan. Depending upon the condition, your plan could have included both over the counter or prescription medications.  Please review your pharmacy choice. Make sure the pharmacy is open so you can pick up prescription now. If there is a problem, you may contact your provider through [employer] and have the prescription routed to another pharmacy.   Your safety is important to [place]. If you have drug allergies check your prescription carefully.   For the next 24 hours you can use MyChart to ask questions about today's visit, request a non-urgent call back, or ask for a work or school excuse. You will get an email in the next two days asking about your experience. I hope that your e-visit has been valuable and will speed your recovery.

## 2023-08-20 ENCOUNTER — Telehealth: Payer: Medicaid Other | Admitting: Nurse Practitioner

## 2023-08-20 DIAGNOSIS — R197 Diarrhea, unspecified: Secondary | ICD-10-CM | POA: Diagnosis not present

## 2023-08-20 NOTE — Progress Notes (Signed)
E-Visit for Diarrhea  We are sorry that you are not feeling well.  Here is how we plan to help!  Based on what you have shared with me it looks like you have Acute Infectious Diarrhea. We recommend a follow up with your primary care given you have had recurrent similar symptoms recently   Most cases of acute diarrhea are due to infections with virus and bacteria and are self-limited conditions lasting less than 14 days.  For your symptoms you may take Imodium 2 mg tablets that are over the counter at your local pharmacy. Take two tablet now and then one after each loose stool up to 6 a day.  Antibiotics are not needed for most people with diarrhea.    HOME CARE We recommend changing your diet to help with your symptoms for the next few days. Drink plenty of fluids that contain water salt and sugar. Sports drinks such as Gatorade may help.  You may try broths, soups, bananas, applesauce, soft breads, mashed potatoes or crackers.  You are considered infectious for as long as the diarrhea continues. Hand washing or use of alcohol based hand sanitizers is recommend. It is best to stay out of work or school until your symptoms stop.   GET HELP RIGHT AWAY If you have dark yellow colored urine or do not pass urine frequently you should drink more fluids.   If your symptoms worsen  If you feel like you are going to pass out (faint) You have a new problem  MAKE SURE YOU  Understand these instructions. Will watch your condition. Will get help right away if you are not doing well or get worse.  Thank you for choosing an e-visit.  Your e-visit answers were reviewed by a board certified advanced clinical practitioner to complete your personal care plan. Depending upon the condition, your plan could have included both over the counter or prescription medications.  Please review your pharmacy choice. Make sure the pharmacy is open so you can pick up prescription now. If there is a problem, you  may contact your provider through [employer] and have the prescription routed to another pharmacy.  Your safety is important to [place]. If you have drug allergies check your prescription carefully.   For the next 24 hours you can use MyChart to ask questions about today's visit, request a non-urgent call back, or ask for a work or school excuse. You will get an email in the next two days asking about your experience. I hope that your e-visit has been valuable and will speed your recovery.   I spent approximately 5 minutes reviewing the patient's history, current symptoms and coordinating their care today.

## 2023-09-17 ENCOUNTER — Telehealth: Payer: Medicaid Other | Admitting: Family Medicine

## 2023-09-17 ENCOUNTER — Encounter: Payer: Self-pay | Admitting: Family Medicine

## 2023-09-17 DIAGNOSIS — R197 Diarrhea, unspecified: Secondary | ICD-10-CM | POA: Diagnosis not present

## 2023-09-17 NOTE — Progress Notes (Signed)

## 2023-10-28 ENCOUNTER — Other Ambulatory Visit: Payer: Self-pay

## 2023-10-28 DIAGNOSIS — S0990XA Unspecified injury of head, initial encounter: Secondary | ICD-10-CM | POA: Insufficient documentation

## 2023-10-28 DIAGNOSIS — M542 Cervicalgia: Secondary | ICD-10-CM | POA: Insufficient documentation

## 2023-10-28 DIAGNOSIS — W208XXA Other cause of strike by thrown, projected or falling object, initial encounter: Secondary | ICD-10-CM | POA: Insufficient documentation

## 2023-10-28 DIAGNOSIS — Y990 Civilian activity done for income or pay: Secondary | ICD-10-CM | POA: Diagnosis not present

## 2023-10-28 DIAGNOSIS — Y92242 Post office as the place of occurrence of the external cause: Secondary | ICD-10-CM | POA: Insufficient documentation
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
## 2023-10-29 ENCOUNTER — Emergency Department (HOSPITAL_BASED_OUTPATIENT_CLINIC_OR_DEPARTMENT_OTHER): Payer: Commercial Managed Care - PPO

## 2023-10-29 ENCOUNTER — Emergency Department (HOSPITAL_BASED_OUTPATIENT_CLINIC_OR_DEPARTMENT_OTHER)
Admission: EM | Admit: 2023-10-29 | Discharge: 2023-10-29 | Disposition: A | Discharge status: Home / Self Care | Payer: Commercial Managed Care - PPO | Attending: Emergency Medicine | Admitting: Emergency Medicine

## 2023-10-29 DIAGNOSIS — S0990XA Unspecified injury of head, initial encounter: Secondary | ICD-10-CM

## 2023-10-29 DIAGNOSIS — S161XXA Strain of muscle, fascia and tendon at neck level, initial encounter: Secondary | ICD-10-CM

## 2023-10-29 NOTE — ED Notes (Signed)
Patient back from CT.
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
## 2023-10-29 NOTE — Discharge Instructions (Signed)
Take ibuprofen 600 mg every 6 hours as needed for pain.  Follow-up with primary doctor if not improving in the next few days.

## 2023-11-06 ENCOUNTER — Telehealth: Payer: Commercial Managed Care - PPO | Admitting: Physician Assistant

## 2023-11-06 DIAGNOSIS — J069 Acute upper respiratory infection, unspecified: Secondary | ICD-10-CM

## 2023-11-06 NOTE — Progress Notes (Signed)
For the safety of you and your child, I recommend a face to face office visit with a health care provider.  Many mothers need to take medicines during their pregnancy and while nursing.  Almost all medicines pass into the breast milk in small quantities.  Most are generally considered safe for a mother to take but some medicines must be avoided.  After reviewing your E-Visit request, I recommend that you consult your OB/GYN or pediatrician for medical advice in relation to your condition and prescription medications while pregnant or breastfeeding.  NOTE:  There will be NO CHARGE for this eVisit  If you are having a true medical emergency please call 911.    For an urgent face to face visit, [place] has six urgent care centers for your convenience:     [facility] [facility] [place] Get Driving Directions [id number] [place], [place] [address]    [facility]) Get Driving Directions [id number] [place], [place] [address]  [facility] [place] [facility]) Get Driving Directions [id number] [address] [place],  [place]  [address]  [facility]  Get Driving Directions [id number] [address] [place], [place], [place] [address]   [facility] [place] Get Driving Directions  [id number] [address] [place], [place] [address]   [facility] [place] Get Driving Directions [id number] [address], [place], [place] [address]  Your MyChart E-visit questionnaire answers were reviewed by a board certified advanced clinical practitioner to complete your personal care plan based on your specific symptoms.  Thank you for using e-Visits.   have provided 5 minutes of non face to face time during this encounter for chart review and documentation.

## 2023-11-19 ENCOUNTER — Telehealth: Admitting: Physician Assistant

## 2023-11-19 DIAGNOSIS — R112 Nausea with vomiting, unspecified: Secondary | ICD-10-CM | POA: Diagnosis not present

## 2023-11-19 MED ORDER — ONDANSETRON HCL 4 MG PO TABS: 4.0000 mg | ORAL_TABLET | Freq: Three times a day (TID) | ORAL | 0 refills | Status: DC | PRN

## 2023-11-19 NOTE — Progress Notes (Signed)
 E-Visit for Nausea and Vomiting   We are sorry that you are not feeling well. Here is how we plan to help!  Based on what you have shared with me it looks like you have a Virus that is irritating your GI tract.  Vomiting is the forceful emptying of a portion of the stomach's content through the mouth.  Although nausea and vomiting can make you feel miserable, it's important to remember that these are not diseases, but rather symptoms of an underlying illness.  When we treat short term symptoms, we always caution that any symptoms that persist should be fully evaluated in a medical office.  I have prescribed a medication that will help alleviate your symptoms and allow you to stay hydrated:  Zofran 4 mg 1 tablet every 8 hours as needed for nausea and vomiting  HOME CARE: Drink clear liquids.  This is very important! Dehydration (the lack of fluid) can lead to a serious complication.  Start off with 1 tablespoon every 5 minutes for 8 hours. You may begin eating bland foods after 8 hours without vomiting.  Start with saltine crackers, white bread, rice, mashed potatoes, applesauce. After 48 hours on a bland diet, you may resume a normal diet. Try to go to sleep.  Sleep often empties the stomach and relieves the need to vomit.  GET HELP RIGHT AWAY IF:  Your symptoms do not improve or worsen within 2 days after treatment. You have a fever for over 3 days. You cannot keep down fluids after trying the medication.  MAKE SURE YOU:  Understand these instructions. Will watch your condition. Will get help right away if you are not doing well or get worse.    Thank you for choosing an e-visit.  Your e-visit answers were reviewed by a board certified advanced clinical practitioner to complete your personal care plan. Depending upon the condition, your plan could have included both over the counter or prescription medications.  Please review your pharmacy choice. Make sure the pharmacy is open so  you can pick up prescription now. If there is a problem, you may contact your provider through [employer] and have the prescription routed to another pharmacy.  Your safety is important to [place]. If you have drug allergies check your prescription carefully.   For the next 24 hours you can use MyChart to ask questions about today's visit, request a non-urgent call back, or ask for a work or school excuse. You will get an email in the next two days asking about your experience. I hope that your e-visit has been valuable and will speed your recovery.    have provided 5 minutes of non face to face time during this encounter for chart review and documentation.

## 2023-12-03 ENCOUNTER — Telehealth: Admitting: Physician Assistant

## 2023-12-03 DIAGNOSIS — K047 Periapical abscess without sinus: Secondary | ICD-10-CM

## 2023-12-03 MED ORDER — AMOXICILLIN-POT CLAVULANATE 875-125 MG PO TABS
1.0000 | ORAL_TABLET | Freq: Two times a day (BID) | ORAL | 0 refills | Status: AC
Start: 2023-12-03 — End: ?

## 2023-12-03 NOTE — Progress Notes (Signed)

## 2023-12-30 ENCOUNTER — Telehealth: Admitting: Physician Assistant

## 2023-12-30 DIAGNOSIS — M545 Low back pain, unspecified: Secondary | ICD-10-CM

## 2023-12-30 NOTE — Progress Notes (Signed)
  Because of severe low back pain (9/10) and need for examination, I feel your condition warrants further evaluation and I recommend that you be seen in a face-to-face visit.   NOTE: There will be NO CHARGE for this E-Visit   If you are having a true medical emergency, please call 911.     For an urgent face to face visit, [place] has multiple urgent care centers for your convenience.  Click the link below for the full list of locations and hours, walk-in wait times, appointment scheduling options and driving directions:  Urgent Care - [place], [place], [place], [place], [place], [place]  [place]     Your MyChart E-visit questionnaire answers were reviewed by a board certified advanced clinical practitioner to complete your personal care plan based on your specific symptoms.    Thank you for using e-Visits.

## 2024-02-04 ENCOUNTER — Encounter

## 2024-06-22 ENCOUNTER — Telehealth: Admitting: Physician Assistant

## 2024-06-22 DIAGNOSIS — R112 Nausea with vomiting, unspecified: Secondary | ICD-10-CM | POA: Diagnosis not present

## 2024-06-22 MED ORDER — ONDANSETRON 4 MG PO TBDP
4.0000 mg | ORAL_TABLET | Freq: Three times a day (TID) | ORAL | 0 refills | Status: AC | PRN
Start: 2024-06-22 — End: ?

## 2024-06-22 NOTE — Progress Notes (Signed)

## 2024-07-08 ENCOUNTER — Telehealth: Admitting: Physician Assistant

## 2024-07-08 DIAGNOSIS — A084 Viral intestinal infection, unspecified: Secondary | ICD-10-CM | POA: Diagnosis not present

## 2024-07-08 MED ORDER — ONDANSETRON HCL 4 MG PO TABS: 4.0000 mg | ORAL_TABLET | Freq: Three times a day (TID) | ORAL | 0 refills | Status: AC | PRN

## 2024-07-08 NOTE — Progress Notes (Signed)

## 2024-09-29 ENCOUNTER — Telehealth: Admitting: Physician Assistant

## 2024-09-29 DIAGNOSIS — J111 Influenza due to unidentified influenza virus with other respiratory manifestations: Secondary | ICD-10-CM

## 2024-09-29 MED ORDER — LIDOCAINE VISCOUS HCL 2 % MT SOLN: 15.0000 mL | OROMUCOSAL | 0 refills | Status: AC | PRN

## 2024-09-29 MED ORDER — PROMETHAZINE-DM 6.25-15 MG/5ML PO SYRP: 5.0000 mL | ORAL_SOLUTION | Freq: Four times a day (QID) | ORAL | 0 refills | Status: AC | PRN

## 2024-09-29 NOTE — Progress Notes (Signed)
 E visit for Flu like symptoms   We are sorry that you are not feeling well.  Here is how we plan to help! Based on what you have shared with me it looks like you may have a respiratory virus that may be influenza.  Influenza or the flu is  an infection caused by a respiratory virus. The flu virus is highly contagious and persons who did not receive their yearly flu vaccination may catch the flu from close contact.  We have anti-viral medications to treat the viruses that cause this infection. They are not a cure and only shorten the course of the infection. These prescriptions are most effective when they are given within the first 2 days of flu symptoms. Antiviral medications are indicated if you have a high risk of complications from the flu. You should  also consider an antiviral medication if you are in close contact with someone who is at risk. These medications can help patients avoid complications from the flu but have side effects that you should know.   Possible side effects from Tamiflu  or oseltamivir  include nausea, vomiting, diarrhea, dizziness, headaches, eye redness, sleep problems or other respiratory symptoms. You should not take Tamiflu  if you have an allergy to oseltamivir  or any to the ingredients in Tamiflu ..   For nasal congestion, you may use an oral decongestant such as Mucinex D or if you have glaucoma or high blood pressure use plain Mucinex.  Saline nasal spray or nasal drops can help and can safely be used as often as needed for congestion.  If you have a sore or scratchy throat, use a saltwater gargle-  to  teaspoon of salt dissolved in a 4-ounce to 8-ounce glass of warm water.  Gargle the solution for approximately 15-30 seconds and then spit.  It is important not to swallow the solution.  You can also use throat lozenges/cough drops and Chloraseptic spray to help with throat pain or discomfort.  Warm or cold liquids can also be helpful in relieving throat  pain.  For headache, pain or general discomfort, you can use Ibuprofen  or Tylenol  as directed.   Some authorities believe that zinc sprays or the use of Echinacea may shorten the course of your symptoms.  I have prescribed the following medications to help lessen symptoms: I have prescribed Phenergan  DM 6.25 mg/15 mg. You make take one teaspoon / 5 ml every 4-6 hours as needed for cough  I am also sending viscous lidocaine  for your throat pain.   You are to isolate at home until you have been fever-free for at least 24 hours without a fever-reducing medication, and symptoms have been steadily improving for 24 hours.  If you must be around other household members who do not have symptoms, you need to make sure that both you and the family members are masking consistently with a high-quality mask.  If you note any worsening of symptoms despite treatment, please seek an in-person evaluation ASAP. If you note any significant shortness of breath or any chest pain, please seek ED evaluation. Please do not delay care!  ANYONE WHO HAS FLU SYMPTOMS SHOULD: Stay home. The flu is highly contagious and going out or to work exposes others! Be sure to drink plenty of fluids. Water is fine as well as fruit juices, sodas and electrolyte beverages. You may want to stay away from caffeine  or alcohol. If you are nauseated, try taking small sips of liquids. How do you know if you are getting enough  fluid? Your urine should be a pale yellow or almost colorless. Get rest. Taking a steamy shower or using a humidifier may help nasal congestion and ease sore throat pain. Using a saline nasal spray works much the same way. Cough drops, hard candies and sore throat lozenges may ease your cough. Line up a caregiver. Have someone check on you regularly.  GET HELP RIGHT AWAY IF: You cannot keep down liquids or your medications. You become short of breath Your fell like you are going to pass out or loose  consciousness. Your symptoms persist after you have completed your treatment plan  MAKE SURE YOU  Understand these instructions. Will watch your condition. Will get help right away if you are not doing well or get worse.  Your e-visit answers were reviewed by a board certified advanced clinical practitioner to complete your personal care plan.  Depending on the condition, your plan could have included both over the counter or prescription medications.  If there is a problem please reply  once you have received a response from your provider.  Your safety is important to us .  If you have drug allergies check your prescription carefully.    You can use MyChart to ask questions about todays visit, request a non-urgent call back, or ask for a work or school excuse for 24 hours related to this e-Visit. If it has been greater than 24 hours you will need to follow up with your provider, or enter a new e-Visit to address those concerns.  You will get an e-mail in the next two days asking about your experience.  I hope that your e-visit has been valuable and will speed your recovery. Thank you for using e-visits.   I have spent 5 minutes in review of e-visit questionnaire, review and updating patient chart, medical decision making and response to patient.   Daden Mahany, FNP

## 2024-09-30 ENCOUNTER — Telehealth

## 2024-09-30 DIAGNOSIS — R6889 Other general symptoms and signs: Secondary | ICD-10-CM | POA: Diagnosis not present

## 2024-09-30 DIAGNOSIS — R051 Acute cough: Secondary | ICD-10-CM | POA: Diagnosis not present

## 2024-09-30 DIAGNOSIS — R52 Pain, unspecified: Secondary | ICD-10-CM

## 2024-09-30 DIAGNOSIS — Z0289 Encounter for other administrative examinations: Secondary | ICD-10-CM | POA: Diagnosis not present

## 2024-09-30 NOTE — Progress Notes (Signed)
 " Virtual Visit Consent   Stephanie Gutierrez, you are scheduled for a virtual visit with a [facility] provider today. Just as with appointments in the office, your consent must be obtained to participate. Your consent will be active for this visit and any virtual visit you may have with one of our providers in the next 365 days. If you have a MyChart account, a copy of this consent can be sent to you electronically.  As this is a virtual visit, video technology does not allow for your provider to perform a traditional examination. This may limit your provider's ability to fully assess your condition. If your provider identifies any concerns that need to be evaluated in person or the need to arrange testing (such as labs, EKG, etc.), we will make arrangements to do so. Although advances in technology are sophisticated, we cannot ensure that it will always work on either your end or our end. If the connection with a video visit is poor, the visit may have to be switched to a telephone visit. With either a video or telephone visit, we are not always able to ensure that we have a secure connection.  By engaging in this virtual visit, you consent to the provision of healthcare and authorize for your insurance to be billed (if applicable) for the services provided during this visit. Depending on your insurance coverage, you may receive a charge related to this service.  I need to obtain your verbal consent now. Are you willing to proceed with your visit today? Stephanie Gutierrez has provided verbal consent on 09/30/2024 for a virtual visit (video or telephone). Delon CHRISTELLA Dickinson, PA-C  Date: 09/30/2024 1:44 PM   Virtual Visit via Video Note   I, Delon CHRISTELLA Dickinson, connected with  Stephanie Gutierrez  ([id number], [birth year]) on 09/30/24 at  1:30 PM EST by a video-enabled telemedicine application and verified that I am speaking with the correct person using two identifiers.  Location: Patient: Virtual Visit Location  Patient: Home Provider: Virtual Visit Location Provider: Home Office   I discussed the limitations of evaluation and management by telemedicine and the availability of in person appointments. The patient expressed understanding and agreed to proceed.    History of Present Illness: Stephanie Gutierrez is a 34 y.o. who identifies as a female who was assigned female at birth, and is being seen today for flu-like symptoms.  Completed an EV last night for the same. Symptoms started around 09/26/24. Having body aches, sore throat, and cough. Denies fevers, chills now. Sore throat improving as well. Did have an episode of vomiting this morning upon awakening. Having mild nausea, but able to eat and drink.  Had Promethazine  DM and Viscous Lidocaine  prescribed last night but has not yet picked these up from the pharmacy. Reports her sister is getting them today for her when she gets off work.  HPI: HPI  Problems:  Patient Active Problem List   Diagnosis Date Noted   Primary hypertension 01/01/2023   Anxiety and depression 01/01/2023   Rhabdomyolysis 07/10/2014    Allergies: Allergies[1] Medications: Current Medications[2]  Observations/Objective: Patient is well-developed, well-nourished in no acute distress.  Resting comfortably at home.  Head is normocephalic, atraumatic.  No labored breathing.  Speech is clear and coherent with logical content.  Patient is alert and oriented at baseline.    Assessment and Plan: 1. Body aches (Primary)  2. Acute cough  3. Flu-like symptoms  4. Encounter to obtain excuse from work  - Suspect viral URI -  Symptomatic medications of choice over the counter as needed - Advised to start medications previously prescribed once able - Steam and Humidifier can help - Push fluids - Rest - Work note extended - Seek further evaluation if symptoms change or worsen   Follow Up Instructions: I discussed the assessment and treatment plan with the patient. The  patient was provided an opportunity to ask questions and all were answered. The patient agreed with the plan and demonstrated an understanding of the instructions.  A copy of instructions were sent to the patient via MyChart unless otherwise noted below.    The patient was advised to call back or seek an in-person evaluation if the symptoms worsen or if the condition fails to improve as anticipated.    Delon HERO Konnor Jorden, PA-C     [1] No Known Allergies [2]  Current Outpatient Medications:    amLODipine  (NORVASC ) 10 MG tablet, Take 1 tablet (10 mg total) by mouth daily., Disp: 90 tablet, Rfl: 1   amoxicillin -clavulanate (AUGMENTIN ) 875-125 MG tablet, Take 1 tablet by mouth 2 (two) times daily., Disp: 14 tablet, Rfl: 0   diclofenac (VOLTAREN) 75 MG EC tablet, Take 75 mg by mouth 2 (two) times daily as needed., Disp: , Rfl:    fluticasone  (FLONASE ) 50 MCG/ACT nasal spray, Place 2 sprays into both nostrils daily., Disp: 18 mL, Rfl: 0   hydrochlorothiazide  (MICROZIDE ) 12.5 MG capsule, Take 1 capsule by mouth once daily, Disp: 30 capsule, Rfl: 0   ibuprofen  (ADVIL ) 600 MG tablet, Take 1 tablet (600 mg total) by mouth every 8 (eight) hours as needed., Disp: 30 tablet, Rfl: 0   lidocaine  (XYLOCAINE ) 2 % solution, Use as directed 15 mLs in the mouth or throat as needed for up to 5 days., Disp: 200 mL, Rfl: 0   meloxicam  (MOBIC ) 15 MG tablet, Take 1 tablet (15 mg total) by mouth daily., Disp: 30 tablet, Rfl: 1   ondansetron  (ZOFRAN ) 4 MG tablet, Take 1 tablet (4 mg total) by mouth every 8 (eight) hours as needed for nausea or vomiting., Disp: 20 tablet, Rfl: 0   ondansetron  (ZOFRAN -ODT) 4 MG disintegrating tablet, Take 1 tablet (4 mg total) by mouth every 8 (eight) hours as needed., Disp: 20 tablet, Rfl: 0   oxyCODONE -acetaminophen  (PERCOCET/ROXICET) 5-325 MG tablet, Take 1 tablet by mouth every 6 (six) hours as needed for severe pain., Disp: 2 tablet, Rfl: 0   promethazine -dextromethorphan  (PROMETHAZINE -DM) 6.25-15 MG/5ML syrup, Take 5 mLs by mouth 4 (four) times daily as needed for up to 10 days for cough., Disp: 118 mL, Rfl: 0   tiZANidine (ZANAFLEX) 4 MG tablet, Take 4 mg by mouth every 8 (eight) hours as needed., Disp: , Rfl:   "

## 2024-09-30 NOTE — Patient Instructions (Signed)
 " Stephanie Gutierrez, thank you for joining Delon CHRISTELLA Dickinson, PA-C for today's virtual visit.  While this provider is not your primary care provider (PCP), if your PCP is located in our provider database this encounter information will be shared with them immediately following your visit.   A [facility] MyChart account gives you access to today's visit and all your visits, tests, and labs performed at [facility]  click here if you don't have a [facility] MyChart account or go to [email or web address]  Consent: (Patient) Stephanie Gutierrez provided verbal consent for this virtual visit at the beginning of the encounter.  Current Medications:  Current Outpatient Medications:    amLODipine  (NORVASC ) 10 MG tablet, Take 1 tablet (10 mg total) by mouth daily., Disp: 90 tablet, Rfl: 1   amoxicillin -clavulanate (AUGMENTIN ) 875-125 MG tablet, Take 1 tablet by mouth 2 (two) times daily., Disp: 14 tablet, Rfl: 0   diclofenac (VOLTAREN) 75 MG EC tablet, Take 75 mg by mouth 2 (two) times daily as needed., Disp: , Rfl:    fluticasone  (FLONASE ) 50 MCG/ACT nasal spray, Place 2 sprays into both nostrils daily., Disp: 18 mL, Rfl: 0   hydrochlorothiazide  (MICROZIDE ) 12.5 MG capsule, Take 1 capsule by mouth once daily, Disp: 30 capsule, Rfl: 0   ibuprofen  (ADVIL ) 600 MG tablet, Take 1 tablet (600 mg total) by mouth every 8 (eight) hours as needed., Disp: 30 tablet, Rfl: 0   lidocaine  (XYLOCAINE ) 2 % solution, Use as directed 15 mLs in the mouth or throat as needed for up to 5 days., Disp: 200 mL, Rfl: 0   meloxicam  (MOBIC ) 15 MG tablet, Take 1 tablet (15 mg total) by mouth daily., Disp: 30 tablet, Rfl: 1   ondansetron  (ZOFRAN ) 4 MG tablet, Take 1 tablet (4 mg total) by mouth every 8 (eight) hours as needed for nausea or vomiting., Disp: 20 tablet, Rfl: 0   ondansetron  (ZOFRAN -ODT) 4 MG disintegrating tablet, Take 1 tablet (4 mg total) by mouth every 8 (eight) hours as needed., Disp: 20 tablet, Rfl: 0    oxyCODONE -acetaminophen  (PERCOCET/ROXICET) 5-325 MG tablet, Take 1 tablet by mouth every 6 (six) hours as needed for severe pain., Disp: 2 tablet, Rfl: 0   promethazine -dextromethorphan (PROMETHAZINE -DM) 6.25-15 MG/5ML syrup, Take 5 mLs by mouth 4 (four) times daily as needed for up to 10 days for cough., Disp: 118 mL, Rfl: 0   tiZANidine (ZANAFLEX) 4 MG tablet, Take 4 mg by mouth every 8 (eight) hours as needed., Disp: , Rfl:    Medications ordered in this encounter:  No orders of the defined types were placed in this encounter.    *If you need refills on other medications prior to your next appointment, please contact your pharmacy*  Follow-Up: Call back or seek an in-person evaluation if the symptoms worsen or if the condition fails to improve as anticipated.  [facility] [phone]  Other Instructions  Viral Respiratory Infection A respiratory infection is an illness that affects part of the respiratory system, such as the lungs, nose, or throat. A respiratory infection that is caused by a virus is called a viral respiratory infection. Common types of viral respiratory infections include: A cold. The flu (influenza). A respiratory syncytial virus (RSV) infection. What are the causes? This condition is caused by a virus. The virus may spread through contact with droplets or direct contact with infected people or their mucus or secretions. The virus may spread from person to person (is contagious). What are the signs  or symptoms? Symptoms of this condition include: A stuffy or runny nose. A sore throat or cough. Shortness of breath or difficulty breathing. Yellow or green mucus (sputum). Other symptoms may include: A fever. Sweating or chills. Fatigue. Achy muscles. A headache. How is this diagnosed? This condition may be diagnosed based on: Your symptoms. A physical exam. Testing of secretions from the nose or throat. Chest X-ray. How is this  treated? This condition may be treated with medicines, such as: Antiviral medicine. This may shorten the length of time a person has symptoms. Expectorants. These make it easier to cough up mucus. Decongestant nasal sprays. Acetaminophen  or NSAIDs, such as ibuprofen , to relieve fever and pain. Antibiotic medicines are not prescribed for viral infections.This is because antibiotics are designed to kill bacteria. They do not kill viruses. Follow these instructions at home: Managing pain and congestion Take over-the-counter and prescription medicines only as told by your health care provider. If you have a sore throat, gargle with a mixture of salt and water 3-4 times a day or as needed. To make salt water, completely dissolve -1 tsp (3-6 g) of salt in 1 cup (237 mL) of warm water. Use nose drops made from salt water to ease congestion and soften raw skin around your nose. Take 2 tsp (10 mL) of honey at bedtime to lessen coughing at night. Do not give honey to children who are younger than 1 year. Drink enough fluid to keep your urine pale yellow. This helps prevent dehydration and helps loosen up mucus. General instructions  Rest as much as possible. Do not drink alcohol. Do not use any products that contain nicotine or tobacco. These products include cigarettes, chewing tobacco, and vaping devices, such as e-cigarettes. If you need help quitting, ask your health care provider. Keep all follow-up visits. This is important. How is this prevented?     Get an annual flu shot. You may get the flu shot in late summer, fall, or winter. Ask your health care provider when you should get your flu shot. Avoid spreading your infection to other people. If you are sick: Wash your hands with soap and water often, especially after you cough or sneeze. Wash for at least 20 seconds. If soap and water are not available, use alcohol-based hand sanitizer. Cover your mouth when you cough. Cover your nose and  mouth when you sneeze. Do not share cups or eating utensils. Clean commonly used objects often. Clean commonly touched surfaces. Stay home from work or school as told by your health care provider. Avoid contact with people who are sick during cold and flu season. This is generally fall and winter. Contact a health care provider if: Your symptoms last for 10 days or longer. Your symptoms get worse over time. You have severe sinus pain in your face or forehead. The glands in your jaw or neck become very swollen. You have shortness of breath. Get help right away if you: Feel pain or pressure in your chest. Have trouble breathing. Faint or feel like you will faint. Have severe and persistent vomiting. Feel confused or disoriented. These symptoms may represent a serious problem that is an emergency. Do not wait to see if the symptoms will go away. Get medical help right away. Call your local emergency services (911 in the [place] Do not drive yourself to the hospital. Summary A respiratory infection is an illness that affects part of the respiratory system, such as the lungs, nose, or throat. A respiratory infection that  is caused by a virus is called a viral respiratory infection. Common types of viral respiratory infections include a cold, influenza, and respiratory syncytial virus (RSV) infection. Symptoms of this condition include a stuffy or runny nose, cough, fatigue, achy muscles, sore throat, and fevers or chills. Antibiotic medicines are not prescribed for viral infections. This is because antibiotics are designed to kill bacteria. They are not effective against viruses. This information is not intended to replace advice given to you by your health care provider. Make sure you discuss any questions you have with your health care provider. Document Revised: 12/06/2020 Document Reviewed: 12/06/2020 Elsevier Patient Education  2024 [employer]   If you have been instructed to have an  in-person evaluation today at a local Urgent Care facility, please use the link below. It will take you to a list of all of our available [facility], including address, phone number and hours of operation. Please do not delay care.  [facility]  If you or a family member do not have a primary care provider, use the link below to schedule a visit and establish care. When you choose a [facility] primary care physician or advanced practice provider, you gain a long-term partner in health. Find a Primary Care Provider  Learn more about [facility]'s in-office and virtual care options: [facility] - Get Care Now "

## 2024-10-06 ENCOUNTER — Telehealth: Admitting: Family Medicine

## 2024-10-06 DIAGNOSIS — R112 Nausea with vomiting, unspecified: Secondary | ICD-10-CM

## 2024-10-06 NOTE — Progress Notes (Signed)
" °  Because you have been vomiting longer than 3 days, I feel your condition warrants further evaluation and I recommend that you be seen in a face-to-face visit.   NOTE: There will be NO CHARGE for this E-Visit   If you are having a true medical emergency, please call 911.     For an urgent face to face visit, [facility] has multiple urgent care centers for your convenience.  Click the link below for the full list of locations and hours, walk-in wait times, appointment scheduling options and driving directions:  Urgent Care - [place], [place], [place], [place], [place], [place]  [place]     Your MyChart E-visit questionnaire answers were reviewed by a board certified advanced clinical practitioner to complete your personal care plan based on your specific symptoms.    Thank you for using e-Visits.    "
